# Patient Record
Sex: Female | Born: 1950
Health system: Southern US, Community
[De-identification: ages and names within clinical notes are randomized; demographics above are authoritative.]

## PROBLEM LIST (undated history)

## (undated) DIAGNOSIS — Z9889 Other specified postprocedural states: Secondary | ICD-10-CM

## (undated) DIAGNOSIS — R011 Cardiac murmur, unspecified: Secondary | ICD-10-CM

## (undated) DIAGNOSIS — E041 Nontoxic single thyroid nodule: Secondary | ICD-10-CM

## (undated) DIAGNOSIS — M1990 Unspecified osteoarthritis, unspecified site: Secondary | ICD-10-CM

## (undated) DIAGNOSIS — K08109 Complete loss of teeth, unspecified cause, unspecified class: Secondary | ICD-10-CM

## (undated) DIAGNOSIS — E785 Hyperlipidemia, unspecified: Secondary | ICD-10-CM

## (undated) DIAGNOSIS — R200 Anesthesia of skin: Secondary | ICD-10-CM

## (undated) DIAGNOSIS — H269 Unspecified cataract: Secondary | ICD-10-CM

## (undated) DIAGNOSIS — R112 Nausea with vomiting, unspecified: Secondary | ICD-10-CM

## (undated) DIAGNOSIS — G4730 Sleep apnea, unspecified: Secondary | ICD-10-CM

## (undated) DIAGNOSIS — I7121 Aneurysm of the ascending aorta, without rupture: Secondary | ICD-10-CM

## (undated) DIAGNOSIS — G510 Bell's palsy: Secondary | ICD-10-CM

## (undated) DIAGNOSIS — F32A Depression, unspecified: Secondary | ICD-10-CM

## (undated) DIAGNOSIS — J4520 Mild intermittent asthma, uncomplicated: Secondary | ICD-10-CM

## (undated) DIAGNOSIS — Z87440 Personal history of urinary (tract) infections: Secondary | ICD-10-CM

## (undated) DIAGNOSIS — K219 Gastro-esophageal reflux disease without esophagitis: Secondary | ICD-10-CM

## (undated) DIAGNOSIS — F419 Anxiety disorder, unspecified: Secondary | ICD-10-CM

## (undated) DIAGNOSIS — R030 Elevated blood-pressure reading, without diagnosis of hypertension: Secondary | ICD-10-CM

## (undated) DIAGNOSIS — Z87442 Personal history of urinary calculi: Secondary | ICD-10-CM

## (undated) DIAGNOSIS — G629 Polyneuropathy, unspecified: Secondary | ICD-10-CM

## (undated) DIAGNOSIS — Z972 Presence of dental prosthetic device (complete) (partial): Secondary | ICD-10-CM

## (undated) DIAGNOSIS — R0600 Dyspnea, unspecified: Secondary | ICD-10-CM

## (undated) DIAGNOSIS — I2510 Atherosclerotic heart disease of native coronary artery without angina pectoris: Secondary | ICD-10-CM

## (undated) DIAGNOSIS — M8580 Other specified disorders of bone density and structure, unspecified site: Secondary | ICD-10-CM

## (undated) DIAGNOSIS — T7840XA Allergy, unspecified, initial encounter: Secondary | ICD-10-CM

## (undated) DIAGNOSIS — R202 Paresthesia of skin: Secondary | ICD-10-CM

## (undated) DIAGNOSIS — I712 Thoracic aortic aneurysm, without rupture: Secondary | ICD-10-CM

## (undated) HISTORY — DX: Sleep apnea, unspecified: G47.30

## (undated) HISTORY — DX: Hyperlipidemia, unspecified: E78.5

## (undated) HISTORY — PX: EXTRACORPOREAL SHOCK WAVE LITHOTRIPSY: SHX1557

## (undated) HISTORY — DX: Allergy, unspecified, initial encounter: T78.40XA

## (undated) HISTORY — DX: Other specified disorders of bone density and structure, unspecified site: M85.80

## (undated) HISTORY — DX: Anxiety disorder, unspecified: F41.9

## (undated) HISTORY — DX: Gastro-esophageal reflux disease without esophagitis: K21.9

## (undated) HISTORY — DX: Unspecified cataract: H26.9

## (undated) HISTORY — PX: TUBAL LIGATION: SHX77

## (undated) HISTORY — PX: JOINT REPLACEMENT: SHX530

## (undated) HISTORY — DX: Depression, unspecified: F32.A

## (undated) HISTORY — PX: ANTERIOR CERVICAL DECOMP/DISCECTOMY FUSION: SHX1161

## (undated) HISTORY — PX: SPINE SURGERY: SHX786

## (undated) HISTORY — PX: MOUTH SURGERY: SHX715

## (undated) HISTORY — PX: FRACTURE SURGERY: SHX138

---
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
## 1998-02-15 HISTORY — PX: CARPAL TUNNEL RELEASE: SHX101

## 1998-05-20 ENCOUNTER — Ambulatory Visit (HOSPITAL_BASED_OUTPATIENT_CLINIC_OR_DEPARTMENT_OTHER): Admission: RE | Admit: 1998-05-20 | Discharge: 1998-05-20 | Payer: Self-pay | Admitting: Orthopedic Surgery

## 1998-06-17 ENCOUNTER — Ambulatory Visit (HOSPITAL_BASED_OUTPATIENT_CLINIC_OR_DEPARTMENT_OTHER): Admission: RE | Admit: 1998-06-17 | Discharge: 1998-06-17 | Payer: Self-pay | Admitting: Orthopedic Surgery

## 2006-08-01 ENCOUNTER — Emergency Department (HOSPITAL_COMMUNITY): Admission: EM | Admit: 2006-08-01 | Discharge: 2006-08-01 | Payer: Self-pay | Admitting: Family Medicine

## 2007-05-03 ENCOUNTER — Emergency Department (HOSPITAL_COMMUNITY): Admission: EM | Admit: 2007-05-03 | Discharge: 2007-05-03 | Payer: Self-pay | Admitting: Family Medicine

## 2007-09-29 ENCOUNTER — Inpatient Hospital Stay (HOSPITAL_COMMUNITY): Admission: RE | Admit: 2007-09-29 | Discharge: 2007-10-02 | Payer: Self-pay | Admitting: Specialist
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
## 2010-11-13 LAB — PROTIME-INR
INR: 1
Prothrombin Time: 13.6

## 2010-11-13 LAB — URINALYSIS, ROUTINE W REFLEX MICROSCOPIC
Bilirubin Urine: NEGATIVE
Bilirubin Urine: NEGATIVE
Glucose, UA: NEGATIVE
Glucose, UA: NEGATIVE
Hgb urine dipstick: NEGATIVE
Ketones, ur: NEGATIVE
Ketones, ur: NEGATIVE
Nitrite: NEGATIVE
Nitrite: NEGATIVE
Protein, ur: NEGATIVE
Protein, ur: NEGATIVE
Specific Gravity, Urine: 1.004 — ABNORMAL LOW
Specific Gravity, Urine: 1.015
Urobilinogen, UA: 0.2
Urobilinogen, UA: 0.2
pH: 6
pH: 7

## 2010-11-13 LAB — COMPREHENSIVE METABOLIC PANEL
ALT: 20
AST: 19
Albumin: 3.5
Alkaline Phosphatase: 85
BUN: 8
CO2: 29
Calcium: 9.1
Chloride: 107
Creatinine, Ser: 0.68
GFR calc Af Amer: >60
GFR calc non Af Amer: >60
Glucose, Bld: 116 — ABNORMAL HIGH
Potassium: 3.4 — ABNORMAL LOW
Sodium: 144
Total Bilirubin: 0.6
Total Protein: 6.2

## 2010-11-13 LAB — DIFFERENTIAL
Basophils Absolute: 0
Basophils Relative: 1
Eosinophils Absolute: 0.5
Eosinophils Relative: 7 — ABNORMAL HIGH
Lymphocytes Relative: 24
Lymphs Abs: 2
Monocytes Absolute: 0.5
Monocytes Relative: 6
Neutro Abs: 5.2
Neutrophils Relative %: 63

## 2010-11-13 LAB — TYPE AND SCREEN
ABO/RH(D): O POS
Antibody Screen: NEGATIVE

## 2010-11-13 LAB — ABO/RH: ABO/RH(D): O POS

## 2010-11-13 LAB — URINE MICROSCOPIC-ADD ON

## 2010-11-13 LAB — CBC
HCT: 38.7
Hemoglobin: 13.1
MCHC: 33.7
MCV: 97.1
Platelets: 297
RBC: 3.99
RDW: 13.3
WBC: 8.3

## 2010-11-13 LAB — APTT: aPTT: 29

## 2010-12-02 LAB — POCT RAPID STREP A: Streptococcus, Group A Screen (Direct): NEGATIVE

## 2012-07-27 ENCOUNTER — Other Ambulatory Visit (HOSPITAL_COMMUNITY): Payer: Self-pay | Admitting: Family Medicine

## 2012-07-27 ENCOUNTER — Ambulatory Visit (HOSPITAL_COMMUNITY)
Admission: RE | Admit: 2012-07-27 | Discharge: 2012-07-27 | Disposition: A | Discharge status: Home / Self Care | Payer: Disability Insurance | Source: Ambulatory Visit | Attending: Family Medicine | Admitting: Family Medicine

## 2012-07-27 DIAGNOSIS — R52 Pain, unspecified: Secondary | ICD-10-CM

## 2012-07-27 DIAGNOSIS — M19049 Primary osteoarthritis, unspecified hand: Secondary | ICD-10-CM | POA: Insufficient documentation

## 2012-07-27 DIAGNOSIS — M1710 Unilateral primary osteoarthritis, unspecified knee: Secondary | ICD-10-CM | POA: Insufficient documentation

## 2015-10-10 ENCOUNTER — Encounter: Payer: Self-pay | Admitting: Pediatrics

## 2015-10-10 ENCOUNTER — Ambulatory Visit (INDEPENDENT_AMBULATORY_CARE_PROVIDER_SITE_OTHER): Payer: Medicare Other | Admitting: Pediatrics

## 2015-10-10 VITALS — BP 135/71 | HR 64 | Temp 97.3°F | Ht 58.25 in | Wt 161.0 lb

## 2015-10-10 DIAGNOSIS — Z1159 Encounter for screening for other viral diseases: Secondary | ICD-10-CM | POA: Diagnosis not present

## 2015-10-10 DIAGNOSIS — R7303 Prediabetes: Secondary | ICD-10-CM

## 2015-10-10 DIAGNOSIS — I10 Essential (primary) hypertension: Secondary | ICD-10-CM

## 2015-10-10 DIAGNOSIS — M5489 Other dorsalgia: Secondary | ICD-10-CM

## 2015-10-10 DIAGNOSIS — G629 Polyneuropathy, unspecified: Secondary | ICD-10-CM

## 2015-10-10 DIAGNOSIS — F4323 Adjustment disorder with mixed anxiety and depressed mood: Secondary | ICD-10-CM

## 2015-10-10 DIAGNOSIS — K219 Gastro-esophageal reflux disease without esophagitis: Secondary | ICD-10-CM | POA: Diagnosis not present

## 2015-10-10 DIAGNOSIS — E785 Hyperlipidemia, unspecified: Secondary | ICD-10-CM | POA: Diagnosis not present

## 2015-10-10 LAB — BAYER DCA HB A1C WAIVED: HB A1C (BAYER DCA - WAIVED): 5.9 % (ref <7.0)

## 2015-10-10 MED ORDER — CITALOPRAM HYDROBROMIDE 20 MG PO TABS: 20.0000 mg | ORAL_TABLET | Freq: Every day | ORAL | 3 refills | Status: DC

## 2015-10-10 MED ORDER — GABAPENTIN 300 MG PO CAPS: 300.0000 mg | ORAL_CAPSULE | Freq: Two times a day (BID) | ORAL | 2 refills | Status: DC

## 2015-10-10 MED ORDER — RANITIDINE HCL 150 MG PO TABS
150.0000 mg | ORAL_TABLET | Freq: Two times a day (BID) | ORAL | 2 refills | Status: DC
Start: 2015-10-10 — End: 2015-12-23

## 2015-10-10 MED ORDER — CYCLOBENZAPRINE HCL 10 MG PO TABS: 10.0000 mg | ORAL_TABLET | Freq: Three times a day (TID) | ORAL | 2 refills | Status: DC | PRN

## 2015-10-10 MED ORDER — LISINOPRIL-HYDROCHLOROTHIAZIDE 10-12.5 MG PO TABS: 1.0000 | ORAL_TABLET | Freq: Every day | ORAL | 2 refills | Status: DC

## 2015-10-10 NOTE — Progress Notes (Addendum)
Subjective:   [name], female    DOB: 1950/08/14, 65 y.o.   MRN: [id number] CC: Establish Care (htn, and other chronic health problems)  HPI: Joyce Ross is a 65 y.o. female presenting for Establish Care (htn, and other chronic health problems)  Arthritis in her hands, hips, back Recently approved for medicare Takes flexeril for back spasms Was following at health dept in [place]  Dx with neuropathy Has trouble breathing at times Worse when she gets URI Stopped smoking 3 years ago  Bells Palsy almost 71 yrs ago Still with some symptoms  Lots of stress from babies   Past Medical History:  Diagnosis Date  . Bell's palsy   . Bladder infection    FREQUENT  . GERD (gastroesophageal reflux disease)   . Hyperlipidemia   . Hypertension   . Neuropathy (HCC)    FEET AND LEGS (NON DIABETIC)   Family History  Problem Relation Age of Onset  . Heart disease Mother   . Heart disease Father     heart attack  . Heart disease Sister   . Diabetes Brother   . Diabetes Brother     sepsis  . Heart disease Brother    Social History   Social History  . Marital status: Married    Spouse name: N/A  . Number of children: N/A  . Years of education: N/A   Social History Main Topics  . Smoking status: Former Smoker    Types: Cigarettes    Quit date: 10/09/2012  . Smokeless tobacco: Never Used  . Alcohol use No  . Drug use: No  . Sexual activity: Not Asked   Other Topics Concern  . None   Social History Narrative  . None   ROS: All systems negative other than what is in HPI  Objective:    BP 135/71 (BP Location: Left Wrist)   Pulse 64   Temp 97.3 F (36.3 C) (Oral)   Ht 4' 10.25" (1.48 m)   Wt 161 lb (73 kg)   BMI 33.36 kg/m   Wt Readings from Last 3 Encounters:  10/10/15 161 lb (73 kg)    Gen: NAD, alert, cooperative with exam, NCAT EYES: EOMI, no conjunctival injection, or no icterus ENT:  TMs pearly gray b/l, OP without erythema LYMPH:  no cervical LAD CV: NRRR, normal V0/J5, I/VI systolic ejection murmur, distal pulses 2+ b/l Resp: CTABL, no wheezes, normal WOB Abd: +BS, soft, NTND. no guarding or organomegaly Ext: No edema, warm Neuro: Alert and oriented, strength equal b/l UE and LE, coordination grossly normal MSK: deviated distal phalanges all fingers, ttp midline lower back  Assessment & Plan:  Mariangela was seen today for establish care.  Diagnoses and all orders for this visit:  Neuropathy [facility]) Says she ahs had work up at health dept Will get records before repeating all lab work up Cont gabapentin -     gabapentin (NEURONTIN) 300 MG capsule; Take 1 capsule (300 mg total) by mouth 2 (two) times daily. -     Bayer DCA Hb A1c Waived -     CMP14+EGFR  Essential hypertension Adequate control today Cont meds BMP today -     lisinopril-hydrochlorothiazide (PRINZIDE,ZESTORETIC) 10-12.5 MG tablet; Take 1 tablet by mouth daily.  Adjustment disorder with mixed anxiety and depressed mood Feels like nerves on edge Wants to start something for mood Start below, half tab x8 days, then full tab -     citalopram (CELEXA) 20 MG tablet; Take 1  tablet (20 mg total) by mouth daily.  Gastroesophageal reflux disease, esophagitis presence not specified Cont below med -     ranitidine (ZANTAC) 150 MG tablet; Take 1 tablet (150 mg total) by mouth 2 (two) times daily.  Midline back pain, unspecified location -     cyclobenzaprine (FLEXERIL) 10 MG tablet; Take 1 tablet (10 mg total) by mouth 3 (three) times daily as needed for muscle spasms.  Need for hepatitis C screening test -     Hepatitis C antibody   Follow up plan: Return in about 4 weeks (around 11/07/2015). Assunta Found, MD [place]

## 2015-10-11 LAB — CMP14+EGFR
ALT: 11 IU/L (ref 0–32)
AST: 12 IU/L (ref 0–40)
Albumin/Globulin Ratio: 1.3 (ref 1.2–2.2)
Albumin: 4 g/dL (ref 3.6–4.8)
Alkaline Phosphatase: 92 IU/L (ref 39–117)
BUN/Creatinine Ratio: 24 (ref 12–28)
BUN: 23 mg/dL (ref 8–27)
Bilirubin Total: 0.2 mg/dL (ref 0.0–1.2)
CO2: 27 mmol/L (ref 18–29)
Calcium: 9.1 mg/dL (ref 8.7–10.3)
Chloride: 102 mmol/L (ref 96–106)
Creatinine, Ser: 0.94 mg/dL (ref 0.57–1.00)
GFR calc Af Amer: 74 mL/min/{1.73_m2} (ref >59)
GFR calc non Af Amer: 64 mL/min/{1.73_m2} (ref >59)
Globulin, Total: 3.1 g/dL (ref 1.5–4.5)
Glucose: 82 mg/dL (ref 65–99)
Potassium: 4 mmol/L (ref 3.5–5.2)
Sodium: 143 mmol/L (ref 134–144)
Total Protein: 7.1 g/dL (ref 6.0–8.5)

## 2015-10-11 LAB — HEPATITIS C ANTIBODY: Hep C Virus Ab: <0.1 s/co ratio (ref 0.0–0.9)

## 2015-10-15 DIAGNOSIS — E785 Hyperlipidemia, unspecified: Secondary | ICD-10-CM | POA: Insufficient documentation

## 2015-10-15 DIAGNOSIS — K219 Gastro-esophageal reflux disease without esophagitis: Secondary | ICD-10-CM | POA: Insufficient documentation

## 2015-10-15 DIAGNOSIS — F4323 Adjustment disorder with mixed anxiety and depressed mood: Secondary | ICD-10-CM | POA: Insufficient documentation

## 2015-10-15 DIAGNOSIS — R7303 Prediabetes: Secondary | ICD-10-CM | POA: Insufficient documentation

## 2015-10-15 DIAGNOSIS — I10 Essential (primary) hypertension: Secondary | ICD-10-CM | POA: Insufficient documentation

## 2015-10-15 DIAGNOSIS — M549 Dorsalgia, unspecified: Secondary | ICD-10-CM | POA: Insufficient documentation

## 2015-10-15 DIAGNOSIS — G629 Polyneuropathy, unspecified: Secondary | ICD-10-CM | POA: Insufficient documentation

## 2015-10-15 DIAGNOSIS — F331 Major depressive disorder, recurrent, moderate: Secondary | ICD-10-CM | POA: Insufficient documentation

## 2015-12-04 ENCOUNTER — Encounter: Payer: Self-pay | Admitting: Pediatrics

## 2015-12-04 ENCOUNTER — Ambulatory Visit (INDEPENDENT_AMBULATORY_CARE_PROVIDER_SITE_OTHER): Payer: Medicare Other | Admitting: Pediatrics

## 2015-12-04 VITALS — BP 126/66 | HR 65 | Temp 98.7°F | Ht 58.25 in | Wt 164.2 lb

## 2015-12-04 DIAGNOSIS — R590 Localized enlarged lymph nodes: Secondary | ICD-10-CM | POA: Diagnosis not present

## 2015-12-04 DIAGNOSIS — M1711 Unilateral primary osteoarthritis, right knee: Secondary | ICD-10-CM

## 2015-12-04 DIAGNOSIS — F4323 Adjustment disorder with mixed anxiety and depressed mood: Secondary | ICD-10-CM | POA: Diagnosis not present

## 2015-12-04 DIAGNOSIS — R059 Cough, unspecified: Secondary | ICD-10-CM

## 2015-12-04 DIAGNOSIS — R0989 Other specified symptoms and signs involving the circulatory and respiratory systems: Secondary | ICD-10-CM | POA: Diagnosis not present

## 2015-12-04 DIAGNOSIS — M7061 Trochanteric bursitis, right hip: Secondary | ICD-10-CM | POA: Diagnosis not present

## 2015-12-04 DIAGNOSIS — I10 Essential (primary) hypertension: Secondary | ICD-10-CM

## 2015-12-04 DIAGNOSIS — R05 Cough: Secondary | ICD-10-CM | POA: Diagnosis not present

## 2015-12-04 DIAGNOSIS — G629 Polyneuropathy, unspecified: Secondary | ICD-10-CM | POA: Diagnosis not present

## 2015-12-04 MED ORDER — GABAPENTIN 300 MG PO CAPS: 300.0000 mg | ORAL_CAPSULE | Freq: Four times a day (QID) | ORAL | 2 refills | Status: DC

## 2015-12-04 NOTE — Progress Notes (Signed)
  Subjective:   [name], female    DOB: [birth year], 65 y.o.   MRN: [id number] CC: Follow-up  HPI: Joyce Ross is a 65 y.o. female presenting for Follow-up  Neuropathy: Does help when she takes gabapentin 600mg  at night Has numbness in her toes throughout the day, not bothering her much  Has R knee pain, when she favors the R knee her R hip starts hurting Resting helps the pain Not taking NSAIDs regularly  Cough and congestion: Off and on for months Feels better for a few weeks This time cough started about two weeks ago Sometimes coughing up green sputum Coughing more than usual No fever No SOB Normal appetite Weight stable  Relevant past medical, surgical, family and social history reviewed. Allergies and medications reviewed and updated. History  Smoking Status  . Former Smoker  . Types: Cigarettes  . Quit date: 10/09/2012  Smokeless Tobacco  . Never Used   ROS: Per HPI   Objective:    BP 126/66   Pulse 65   Temp 98.7 F (37.1 C) (Oral)   Ht 4' 10.25" (1.48 m)   Wt 164 lb 3.2 oz (74.5 kg)   BMI 34.02 kg/m   Wt Readings from Last 3 Encounters:  12/04/15 164 lb 3.2 oz (74.5 kg)  10/10/15 161 lb (73 kg)    Gen: NAD, alert, cooperative with exam, NCAT EYES: EOMI, no conjunctival injection, or no icterus ENT:  TMs pearly gray b/l, OP without erythema LYMPH: b/l supraclavicular LAD, apprx 1 cm a couple of nodes each side, mobile, non-tender CV: NRRR, normal S1/S2, no murmur, distal pulses 2+ b/l Resp: CTABL, no wheezes, normal WOB Abd: +BS, soft, NTND. no guarding or organomegaly Ext: No edema, warm Neuro: Alert and oriented MSK: normal muscle bulk  Assessment & Plan:  Joyce Ross was seen today for follow-up multiple med problems.  Diagnoses and all orders for this visit:  Neuropathy ([place]) Gabapentin helped with symptoms Pt says still noticing in the day Wants to try day time gabapentin Doesn't want nerve conduction study at this  time Says symptoms have been fairly stable, no worsening -     gabapentin (NEURONTIN) 300 MG capsule; Take 1 capsule (300 mg total) by mouth 4 (four) times daily.  Essential hypertension Stopped BP meds a week ago, BP was in 0000000 systolic  Feels better Stop medications  Adjustment disorder with mixed anxiety and depressed mood Stable, cont citalopram  Greater trochanteric bursitis of right hip NSAIDs, rest, gentle stretching. Exercises given.  Osteoarthritis of right knee, unspecified osteoarthritis type NSAIDs, rest  Supraclavicular adenopathy 40+ year smoking history Supraclavicular nodes that have bene there for several months -     CT CHEST WO CONTRAST; Future  Chest congestion -     PR BREATHING CAPACITY TEST  Coughing -     PR BREATHING CAPACITY TEST   Follow up plan: 3 mo Assunta Found, MD [facility] [place]

## 2015-12-04 NOTE — Patient Instructions (Addendum)
Trochanteric Bursitis You have hip pain due to trochanteric bursitis. Bursitis means that the sack near the outside of the hip is filled with fluid and inflamed. This sack is made up of protective soft tissue. The pain from trochanteric bursitis can be severe and keep you from sleep. It can radiate to the buttocks or down the outside of the thigh to the knee. The pain is almost always worse when rising from the seated or lying position and with walking. Pain can improve after you take a few steps. It happens more often in people with hip joint and lumbar spine problems, such as arthritis or previous surgery. Very rarely the trochanteric bursa can become infected, and antibiotics and/or surgery may be needed. Treatment often includes an injection of local anesthetic mixed with cortisone medicine. This medicine is injected into the area where it is most tender over the hip. Repeat injections may be necessary if the response to treatment is slow. You can apply ice packs over the tender area for 30 minutes every 2 hours for the next few days. Anti-inflammatory and/or narcotic pain medicine may also be helpful. Limit your activity for the next few days if the pain continues. See your caregiver in 5-10 days if you are not greatly improved.  SEEK IMMEDIATE MEDICAL CARE IF:  You develop severe pain, fever, or increased redness.  You have pain that radiates below the knee. EXERCISES STRETCHING EXERCISES - Trochanteric Bursitis  These exercises may help you when beginning to rehabilitate your injury. Your symptoms may resolve with or without further involvement from your physician, physical therapist, or athletic trainer. While completing these exercises, remember:   Restoring tissue flexibility helps normal motion to return to the joints. This allows healthier, less painful movement and activity.  An effective stretch should be held for at least 30 seconds.  A stretch should never be painful. You should only  feel a gentle lengthening or release in the stretched tissue. STRETCH - Iliotibial Band  On the floor or bed, lie on your side so your injured leg is on top. Bend your knee and grab your ankle.  Slowly bring your knee back so that your thigh is in line with your trunk. Keep your heel at your buttocks and gently arch your back so your head, shoulders and hips line up.  Slowly lower your leg so that your knee approaches the floor/bed until you feel a gentle stretch on the outside of your thigh. If you do not feel a stretch and your knee will not fall farther, place the heel of your opposite foot on top of your knee and pull your thigh down farther.  Hold this stretch for __________ seconds.  Repeat __________ times. Complete this exercise __________ times per day. STRETCH - Hamstrings, Supine   Lie on your back. Loop a belt or towel over the ball of your foot as shown.  Straighten your knee and slowly pull on the belt to raise your injured leg. Do not allow the knee to bend. Keep your opposite leg flat on the floor.  Raise the leg until you feel a gentle stretch behind your knee or thigh. Hold this position for __________ seconds.  Repeat __________ times. Complete this stretch __________ times per day. STRETCH - Quadriceps, Prone   Lie on your stomach on a firm surface, such as a bed or padded floor.  Bend your knee and grasp your ankle. If you are unable to reach your ankle or pant leg, use a belt   around your foot to lengthen your reach.  Gently pull your heel toward your buttocks. Your knee should not slide out to the side. You should feel a stretch in the front of your thigh and/or knee.  Hold this position for __________ seconds.  Repeat __________ times. Complete this stretch __________ times per day. STRETCHING - Hip Flexors, Lunge Half kneel with your knee on the floor and your opposite knee bent and directly over your ankle.  Keep good posture with your head over your  shoulders. Tighten your buttocks to point your tailbone downward; this will prevent your back from arching too much.  You should feel a gentle stretch in the front of your thigh and/or hip. If you do not feel any resistance, slightly slide your opposite foot forward and then slowly lunge forward so your knee once again lines up over your ankle. Be sure your tailbone remains pointed downward.  Hold this stretch for __________ seconds.  Repeat __________ times. Complete this stretch __________ times per day. STRETCH - Adductors, Lunge  While standing, spread your legs.  Lean away from your injured leg by bending your opposite knee. You may rest your hands on your thigh for balance.  You should feel a stretch in your inner thigh. Hold for __________ seconds.  Repeat __________ times. Complete this exercise __________ times per day.   This information is not intended to replace advice given to you by your health care provider. Make sure you discuss any questions you have with your health care provider.   Document Released: 03/11/2004 Document Revised: 06/18/2014 Document Reviewed: 05/16/2008 Elsevier Interactive Patient Education 2016 Elsevier Inc.  

## 2015-12-10 ENCOUNTER — Ambulatory Visit (HOSPITAL_COMMUNITY): Payer: Medicare Other

## 2015-12-22 ENCOUNTER — Other Ambulatory Visit: Payer: Self-pay | Admitting: *Deleted

## 2015-12-22 DIAGNOSIS — F4323 Adjustment disorder with mixed anxiety and depressed mood: Secondary | ICD-10-CM

## 2015-12-22 MED ORDER — CITALOPRAM HYDROBROMIDE 20 MG PO TABS: 20.0000 mg | ORAL_TABLET | Freq: Every day | ORAL | 1 refills | Status: DC

## 2015-12-23 ENCOUNTER — Other Ambulatory Visit: Payer: Self-pay | Admitting: *Deleted

## 2015-12-23 DIAGNOSIS — K219 Gastro-esophageal reflux disease without esophagitis: Secondary | ICD-10-CM

## 2015-12-23 DIAGNOSIS — G629 Polyneuropathy, unspecified: Secondary | ICD-10-CM

## 2015-12-23 MED ORDER — RANITIDINE HCL 150 MG PO TABS: 150.0000 mg | ORAL_TABLET | Freq: Two times a day (BID) | ORAL | 1 refills | Status: DC

## 2015-12-23 MED ORDER — CYCLOBENZAPRINE HCL 10 MG PO TABS: 10.0000 mg | ORAL_TABLET | Freq: Three times a day (TID) | ORAL | 2 refills | Status: DC | PRN

## 2015-12-23 MED ORDER — GABAPENTIN 300 MG PO CAPS: 300.0000 mg | ORAL_CAPSULE | Freq: Four times a day (QID) | ORAL | 1 refills | Status: DC

## 2015-12-24 ENCOUNTER — Other Ambulatory Visit: Payer: Self-pay | Admitting: *Deleted

## 2016-02-04 ENCOUNTER — Ambulatory Visit: Payer: Medicare Other | Admitting: Pediatrics

## 2016-02-18 ENCOUNTER — Other Ambulatory Visit: Payer: Self-pay | Admitting: Pediatrics

## 2016-05-17 ENCOUNTER — Other Ambulatory Visit: Payer: Self-pay | Admitting: Pediatrics

## 2016-05-17 ENCOUNTER — Telehealth: Payer: Self-pay | Admitting: Pediatrics

## 2016-05-17 NOTE — Telephone Encounter (Signed)
What is the name of the medication? Gabapentin 300 mg, Cyclobenzaprine 10 mg, Citalopram 20 mg and Ranitidine 150 mg  Have you contacted your pharmacy to request a refill? No  Which pharmacy would you like this sent to? Patient wants to pickup   Patient notified that their request is being sent to the clinical staff for review and that they should receive a call once it is complete. If they do not receive a call within 24 hours they can check with their pharmacy or our office.

## 2016-05-17 NOTE — Telephone Encounter (Signed)
Patient don't need all medications

## 2016-05-17 NOTE — Telephone Encounter (Signed)
What is the name of the medication? Cyclobenzaprine 10 mg  Have you contacted your pharmacy to request a refill? NO  Which pharmacy would you like this sent to? Patient is going to pickup   Patient notified that their request is being sent to the clinical staff for review and that they should receive a call once it is complete. If they do not receive a call within 24 hours they can check with their pharmacy or our office.

## 2016-05-19 MED ORDER — CYCLOBENZAPRINE HCL 10 MG PO TABS: ORAL_TABLET | ORAL | 1 refills | Status: DC

## 2016-05-19 NOTE — Telephone Encounter (Signed)
Medication sent to pharmacy  

## 2016-05-20 ENCOUNTER — Ambulatory Visit: Payer: Medicare Other | Admitting: Pediatrics

## 2016-06-07 DIAGNOSIS — Z029 Encounter for administrative examinations, unspecified: Secondary | ICD-10-CM

## 2016-06-15 ENCOUNTER — Telehealth: Payer: Self-pay | Admitting: Pediatrics

## 2016-06-15 DIAGNOSIS — G629 Polyneuropathy, unspecified: Secondary | ICD-10-CM

## 2016-06-15 DIAGNOSIS — F4323 Adjustment disorder with mixed anxiety and depressed mood: Secondary | ICD-10-CM

## 2016-06-16 MED ORDER — CYCLOBENZAPRINE HCL 10 MG PO TABS: ORAL_TABLET | ORAL | 0 refills | Status: DC

## 2016-06-16 MED ORDER — GABAPENTIN 300 MG PO CAPS: 300.0000 mg | ORAL_CAPSULE | Freq: Four times a day (QID) | ORAL | 0 refills | Status: DC

## 2016-06-16 MED ORDER — CITALOPRAM HYDROBROMIDE 20 MG PO TABS: 20.0000 mg | ORAL_TABLET | Freq: Every day | ORAL | 0 refills | Status: DC

## 2016-06-16 NOTE — Telephone Encounter (Signed)
Patient requested the prescriptions to be printed so she could take them to the most cost effective pharmacies.  Advised patient prescriptions had been printed and are up front for her to pick up.

## 2016-06-16 NOTE — Telephone Encounter (Signed)
Where does she want Rx sent in? I will send in 30 days, see her next week as scheduled

## 2016-06-21 ENCOUNTER — Encounter (INDEPENDENT_AMBULATORY_CARE_PROVIDER_SITE_OTHER): Payer: Self-pay

## 2016-06-21 ENCOUNTER — Ambulatory Visit (INDEPENDENT_AMBULATORY_CARE_PROVIDER_SITE_OTHER): Payer: Medicare Other | Admitting: Pediatrics

## 2016-06-21 ENCOUNTER — Encounter: Payer: Self-pay | Admitting: Pediatrics

## 2016-06-21 VITALS — BP 138/66 | HR 63 | Temp 98.8°F | Ht 58.25 in | Wt 171.8 lb

## 2016-06-21 DIAGNOSIS — F4323 Adjustment disorder with mixed anxiety and depressed mood: Secondary | ICD-10-CM

## 2016-06-21 DIAGNOSIS — M1711 Unilateral primary osteoarthritis, right knee: Secondary | ICD-10-CM | POA: Diagnosis not present

## 2016-06-21 DIAGNOSIS — J309 Allergic rhinitis, unspecified: Secondary | ICD-10-CM | POA: Diagnosis not present

## 2016-06-21 DIAGNOSIS — M1611 Unilateral primary osteoarthritis, right hip: Secondary | ICD-10-CM | POA: Diagnosis not present

## 2016-06-21 DIAGNOSIS — M25551 Pain in right hip: Secondary | ICD-10-CM

## 2016-06-21 MED ORDER — CITALOPRAM HYDROBROMIDE 40 MG PO TABS: 40.0000 mg | ORAL_TABLET | Freq: Every day | ORAL | 3 refills | Status: DC

## 2016-06-21 MED ORDER — FLUTICASONE PROPIONATE 50 MCG/ACT NA SUSP: 2.0000 | Freq: Every day | NASAL | 6 refills | Status: DC

## 2016-06-21 MED ORDER — CETIRIZINE HCL 10 MG PO TABS: 10.0000 mg | ORAL_TABLET | Freq: Every day | ORAL | 11 refills | Status: DC

## 2016-06-21 NOTE — Progress Notes (Signed)
  Subjective:   [name], female    DOB: 05-13-1950, 66 y.o.   MRN: [id number] CC: Hip Pain and Back Pain  HPI: Joyce Ross is a 66 y.o. female presenting for Hip Pain and Back Pain  Allergies have been bothering her past couple of weeks Taking night time OTC allergy medicine  Needs a walker, 4 wheels with seat If she tries to do a lot of walking R knee, R hip start bothering her, needs to sit down quickly when knee or hip gives out with walking  Losing patience easier Thinks citalopram is helping but not enough Lots of stress recently with losing benefits, having to pay money back Feels safe at home, no thoughts of self harm  Relevant past medical, surgical, family and social history reviewed. Allergies and medications reviewed and updated. History  Smoking Status  . Former Smoker  . Types: Cigarettes  . Quit date: 10/09/2012  Smokeless Tobacco  . Never Used   ROS: Per HPI   Objective:    BP 138/66   Pulse 63   Temp 98.8 F (37.1 C) (Oral)   Ht 4' 10.25" (1.48 m)   Wt 171 lb 12.8 oz (77.9 kg)   BMI 35.60 kg/m   Wt Readings from Last 3 Encounters:  06/21/16 171 lb 12.8 oz (77.9 kg)  12/04/15 164 lb 3.2 oz (74.5 kg)  10/10/15 161 lb (73 kg)    Gen: NAD, alert, cooperative with exam, NCAT EYES: EOMI, no conjunctival injection, or no icterus ENT:  TMs dull gray b/l, OP without erythema LYMPH: no cervical LAD CV: NRRR, normal S1/S2 Resp: CTABL, no wheezes, normal WOB Abd: +BS, soft, NTND. no guarding or organomegaly Ext: No edema, warm Neuro: Alert and oriented MSK: pain with int rotation R hip   Assessment & Plan:  Joyce Ross was seen today for hip pain and back pain.  Diagnoses and all orders for this visit:  Allergic rhinitis, unspecified seasonality, unspecified trigger Seasonal allergies, ongoing symptoms, start below -     cetirizine (ZYRTEC) 10 MG tablet; Take 1 tablet (10 mg total) by mouth daily. -     fluticasone (FLONASE) 50  MCG/ACT nasal spray; Place 2 sprays into both nostrils daily.  Adjustment disorder with mixed anxiety and depressed mood Continuing mood problems, increase below Feels safe at home Discussed v[facility] program, pt agrees to referral -     citalopram (CELEXA) 40 MG tablet; Take 1 tablet (40 mg total) by mouth daily.  Osteoarthritis of right hip, unspecified osteoarthritis type Needs walker with seat for when her knee or hip gives out -     Walker rolling  Osteoarthritis of right knee, unspecified osteoarthritis type -     Walker rolling  Right hip pain Pain comes and goes, worse with weight bearing Get xray -     DG HIP UNILAT W OR W/O PELVIS 2-3 VIEWS RIGHT; Future   Follow up plan: Return in about 3 months (around 09/21/2016). Assunta Found, MD [place]

## 2016-06-22 ENCOUNTER — Telehealth: Payer: Self-pay | Admitting: Pediatrics

## 2016-06-22 NOTE — Telephone Encounter (Signed)
lmtcb

## 2016-07-08 NOTE — Telephone Encounter (Signed)
Per below

## 2016-07-08 NOTE — Telephone Encounter (Signed)
If hip is bothering her still she can come get xray done. If doing ok can wait until next appt to discuss.

## 2016-07-08 NOTE — Telephone Encounter (Signed)
Patient never got hip xray on 5/7- does she need to come in and do it?

## 2016-07-14 NOTE — Telephone Encounter (Signed)
Left message , can have a hip x-ray if she has not had any improvement.

## 2016-07-15 ENCOUNTER — Encounter: Payer: Self-pay | Admitting: Pediatrics

## 2016-07-15 ENCOUNTER — Ambulatory Visit (INDEPENDENT_AMBULATORY_CARE_PROVIDER_SITE_OTHER): Payer: Medicare Other

## 2016-07-15 ENCOUNTER — Ambulatory Visit (INDEPENDENT_AMBULATORY_CARE_PROVIDER_SITE_OTHER): Payer: Medicare Other | Admitting: Pediatrics

## 2016-07-15 VITALS — BP 139/76 | HR 67 | Temp 98.6°F | Ht 58.25 in | Wt 172.0 lb

## 2016-07-15 DIAGNOSIS — M25551 Pain in right hip: Secondary | ICD-10-CM | POA: Diagnosis not present

## 2016-07-15 DIAGNOSIS — M7061 Trochanteric bursitis, right hip: Secondary | ICD-10-CM | POA: Diagnosis not present

## 2016-07-15 NOTE — Progress Notes (Signed)
  Subjective:   [name], female    DOB: 1951/02/05, 66 y.o.   MRN: [id number] CC: Hip Pain (right, intermittent)  HPI: Joyce Ross is a 66 y.o. female presenting for Hip Pain (right, intermittent)  R hip bothers her when she walks Moving even 20 ft often bothers hip Daily pain Worse with activity No pain at rest Sitting down helps Standing, not walking, helps some  Has been taking 2 ibuprofen when she cant sleep, otherwise not taking meds  Relevant past medical, surgical, family and social history reviewed. Allergies and medications reviewed and updated. History  Smoking Status  . Former Smoker  . Types: Cigarettes  . Quit date: 10/09/2012  Smokeless Tobacco  . Never Used   ROS: Per HPI   Objective:    BP 139/76   Pulse 67   Temp 98.6 F (37 C) (Oral)   Ht 4' 10.25" (1.48 m)   Wt 172 lb (78 kg)   BMI 35.64 kg/m   Wt Readings from Last 3 Encounters:  07/15/16 172 lb (78 kg)  06/21/16 171 lb 12.8 oz (77.9 kg)  12/04/15 164 lb 3.2 oz (74.5 kg)     Gen: NAD, alert, cooperative with exam, NCAT EYES: EOMI, no conjunctival injection, or no icterus CV: NRRR, normal S1/S2, no murmur, distal pulses 2+ b/l Resp: CTABL, no wheezes, normal WOB Abd: +BS, soft, NTND. Ext: No edema, warm Neuro: Alert and oriented, strength equal b/l UE and LE, coordination grossly normal MSK: ttp over R side greater trochanter, pain with internal rotation R hip lateral hip, no groin pain with ROM of R hip  Assessment & Plan:  Joyce Ross was seen today for hip pain.  Diagnoses and all orders for this visit:  Right hip pain No arthritis in hip -     DG HIP UNILAT W OR W/O PELVIS 2-3 VIEWS RIGHT; Future  Trochanteric bursitis of right hip Discussed options, declines steroid injection today, gave exercises/home stretches, NSAIDs, rest, rtc if not improving  Follow up plan: Return in about 3 months (around 10/15/2016). Assunta Found, MD [place]

## 2016-07-15 NOTE — Patient Instructions (Addendum)

## 2016-08-07 ENCOUNTER — Other Ambulatory Visit: Payer: Self-pay | Admitting: Pediatrics

## 2016-08-10 ENCOUNTER — Other Ambulatory Visit: Payer: Self-pay | Admitting: Pediatrics

## 2016-08-10 MED ORDER — CYCLOBENZAPRINE HCL 10 MG PO TABS: ORAL_TABLET | ORAL | 1 refills | Status: DC

## 2016-08-10 NOTE — Telephone Encounter (Signed)
Rx for Flexeril sent to wrong pharmacy on 08/09/16.  Resent Rx to correct pharmacy.

## 2016-08-19 ENCOUNTER — Other Ambulatory Visit: Payer: Self-pay | Admitting: Pediatrics

## 2016-08-19 DIAGNOSIS — G629 Polyneuropathy, unspecified: Secondary | ICD-10-CM

## 2016-09-09 ENCOUNTER — Encounter: Payer: Self-pay | Admitting: Nurse Practitioner

## 2016-09-09 ENCOUNTER — Ambulatory Visit (INDEPENDENT_AMBULATORY_CARE_PROVIDER_SITE_OTHER): Payer: Medicare Other | Admitting: Nurse Practitioner

## 2016-09-09 ENCOUNTER — Telehealth: Payer: Self-pay | Admitting: Nurse Practitioner

## 2016-09-09 VITALS — BP 138/64 | HR 67 | Temp 98.3°F | Ht <= 58 in | Wt 171.0 lb

## 2016-09-09 DIAGNOSIS — R0602 Shortness of breath: Secondary | ICD-10-CM

## 2016-09-09 DIAGNOSIS — J4521 Mild intermittent asthma with (acute) exacerbation: Secondary | ICD-10-CM | POA: Diagnosis not present

## 2016-09-09 MED ORDER — PREDNISONE 20 MG PO TABS: ORAL_TABLET | ORAL | 0 refills | Status: DC

## 2016-09-09 MED ORDER — LEVALBUTEROL HCL 1.25 MG/3ML IN NEBU
1.2500 mg | INHALATION_SOLUTION | Freq: Once | RESPIRATORY_TRACT | Status: AC
  Administered 2016-09-09: 1.25 mg via RESPIRATORY_TRACT

## 2016-09-09 MED ORDER — BUDESONIDE-FORMOTEROL FUMARATE 80-4.5 MCG/ACT IN AERO: 2.0000 | INHALATION_SPRAY | Freq: Two times a day (BID) | RESPIRATORY_TRACT | 12 refills | Status: DC

## 2016-09-09 MED ORDER — ALBUTEROL SULFATE HFA 108 (90 BASE) MCG/ACT IN AERS: 2.0000 | INHALATION_SPRAY | Freq: Four times a day (QID) | RESPIRATORY_TRACT | 0 refills | Status: DC | PRN

## 2016-09-09 MED ORDER — METHYLPREDNISOLONE ACETATE 80 MG/ML IJ SUSP
80.0000 mg | Freq: Once | INTRAMUSCULAR | Status: AC
  Administered 2016-09-09: 80 mg via INTRAMUSCULAR

## 2016-09-09 NOTE — Progress Notes (Signed)
   Subjective:    [name], female    DOB: 08-05-1950, 66 y.o.   MRN: [id number]  HPI Patient comes into the office complaining of the sensation of something in her throat that makes it hard for her to breathe.  She says she can rest and cough and that helps for about 30 minutes to 1 hour.  She has noticed this has been a problem since about 1 week ago when she was mowing the yard without wearing a mask.  Patient takes Zyrtec for allergies and she has run out and not filled her prescription yet.    She states she was given an inhaler years ago and told she had asthmatic bronchitis.  The inhaler helped with her breathing, but she has run out of the prescription and has not been back to the doctor to replace this.   Review of Systems  Respiratory: Positive for cough (productive, green), shortness of breath and wheezing. Negative for chest tightness.   Cardiovascular: Negative for chest pain and palpitations.  All other systems reviewed and are negative.      Objective:   Physical Exam  Constitutional: She is oriented to person, place, and time. She appears well-developed and well-nourished. No distress.  Neck: Normal range of motion. Neck supple.  Cardiovascular: Normal rate, regular rhythm and normal heart sounds.   No murmur heard. Pulmonary/Chest: Effort normal. No respiratory distress. She has wheezes (inspiratory and expiratory).  Lymphadenopathy:    She has no cervical adenopathy.  Neurological: She is alert and oriented to person, place, and time.  Psychiatric: She has a normal mood and affect. Her behavior is normal. Judgment and thought content normal.   BP 138/64   Pulse 67   Temp 98.3 F (36.8 C) (Oral)   Ht 4\' 10"  (1.473 m)   Wt 171 lb (77.6 kg)   BMI 35.74 kg/m   S/P xopenenx treatment- looser breath sounds with only expiratory wheezes throughout.      Assessment & Plan:  1. SOB (shortness of breath) - levalbuterol (XOPENEX) nebulizer solution  1.25 mg; Take 1.25 mg by nebulization once.  2. Mild intermittent asthmatic bronchitis with acute exacerbation Wear a mask when mowing yard - budesonide-formoterol (SYMBICORT) 80-4.5 MCG/ACT inhaler; Inhale 2 puffs into the lungs 2 (two) times daily.  Dispense: 1 Inhaler; Refill: 12 - methylPREDNISolone acetate (DEPO-MEDROL) injection 80 mg; Inject 1 mL (80 mg total) into the muscle once. - predniSONE (DELTASONE) 20 MG tablet; 2 po at sametime daily for 5 days- start tomorrow  Dispense: 10 tablet; Refill: 0 - albuterol (PROVENTIL HFA;VENTOLIN HFA) 108 (90 Base) MCG/ACT inhaler; Inhale 2 puffs into the lungs every 6 (six) hours as needed for wheezing or shortness of breath.  Dispense: 1 Inhaler; Refill: 0  Mary-Margaret Hassell Done, FNP

## 2016-09-09 NOTE — Telephone Encounter (Signed)
Please give symbicort 80 sample x2

## 2016-09-09 NOTE — Patient Instructions (Signed)
Bronchospasm, Adult Bronchospasm is a tightening of the airways going into the lungs. During an episode, it may be harder to breathe. You may cough, and you may make a whistling sound when you breathe (wheeze). This condition often affects people with asthma. What are the causes? This condition is caused by swelling and irritation in the airways. It can be triggered by:  An infection (common).  Seasonal allergies.  An allergic reaction.  Exercise.  Irritants. These include pollution, cigarette smoke, strong odors, aerosol sprays, and paint fumes.  Weather changes. Winds increase molds and pollens in the air. Cold air may cause swelling.  Stress and emotional upset.  What are the signs or symptoms? Symptoms of this condition include:  Wheezing. If the episode was triggered by an allergy, wheezing may start right away or hours later.  Nighttime coughing.  Frequent or severe coughing with a simple cold.  Chest tightness.  Shortness of breath.  Decreased ability to exercise.  How is this diagnosed? This condition is usually diagnosed with a review of your medical history and a physical exam. Tests, such as lung function tests, are sometimes done to look for other conditions. The need for a chest X-ray depends on where the wheezing occurs and whether it is the first time you have wheezed. How is this treated? This condition may be treated with:  Inhaled medicines. These open up the airways and help you breathe. They can be taken with an inhaler or a nebulizer device.  Corticosteroid medicines. These may be given for severe bronchospasm, usually when it is associated with asthma.  Avoiding triggers, such as irritants, infection, or allergies.  Follow these instructions at home: Medicines  Take over-the-counter and prescription medicines only as told by your health care provider.  If you need to use an inhaler or nebulizer to take your medicine, ask your health care  provider to explain how to use it correctly. If you were given a spacer, always use it with your inhaler. Lifestyle  Reduce the number of triggers in your home. To do this: ? Change your heating and air conditioning filter at least once a month. ? Limit your use of fireplaces and wood stoves. ? Do not smoke. Do not allow smoking in your home. ? Avoid using perfumes and fragrances. ? Get rid of pests, such as roaches and mice, and their droppings. ? Remove any mold from your home. ? Keep your house clean and dust free. Use unscented cleaning products. ? Replace carpet with wood, tile, or vinyl flooring. Carpet can trap dander and dust. ? Use allergy-proof pillows, mattress covers, and box spring covers. ? Wash bed sheets and blankets every week in hot water. Dry them in a dryer. ? Use blankets that are made of polyester or cotton. ? Wash your hands often. ? Do not allow pets in your bedroom.  Avoid breathing in cold air when you exercise. General instructions  Have a plan for seeking medical care. Know when to call your health care provider and local emergency services, and where to get emergency care.  Stay up to date on your immunizations.  When you have an episode of bronchospasm, stay calm. Try to relax and breathe more slowly.  If you have asthma, make sure you have an asthma action plan.  Keep all follow-up visits as told by your health care provider. This is important. Contact a health care provider if:  You have muscle aches.  You have chest pain.  The mucus that you   cough up (sputum) changes from clear or white to yellow, green, gray, or bloody.  You have a fever.  Your sputum gets thicker. Get help right away if:  Your wheezing and coughing get worse, even after you take your prescribed medicines.  It gets even harder to breathe.  You develop severe chest pain. Summary  Bronchospasm is a tightening of the airways going into the lungs.  During an episode of  bronchospasm, you may have a harder time breathing. You may cough and make a whistling sound when you breathe (wheeze).  Avoid exposure to triggers such as smoke, dust, mold, animal dander, and fragrances.  When you have an episode of bronchospasm, stay calm. Try to relax and breathe more slowly. This information is not intended to replace advice given to you by your health care provider. Make sure you discuss any questions you have with your health care provider. Document Released: 02/04/2003 Document Revised: 01/29/2016 Document Reviewed: 01/29/2016 Elsevier Interactive Patient Education  2017 Elsevier Inc.  

## 2016-09-10 NOTE — Telephone Encounter (Signed)
Put up front and patient aware.

## 2016-11-03 ENCOUNTER — Other Ambulatory Visit: Payer: Self-pay | Admitting: Pediatrics

## 2016-11-03 DIAGNOSIS — F4323 Adjustment disorder with mixed anxiety and depressed mood: Secondary | ICD-10-CM

## 2016-11-03 DIAGNOSIS — G629 Polyneuropathy, unspecified: Secondary | ICD-10-CM

## 2016-12-02 ENCOUNTER — Encounter: Payer: Self-pay | Admitting: Pediatrics

## 2016-12-02 ENCOUNTER — Ambulatory Visit (INDEPENDENT_AMBULATORY_CARE_PROVIDER_SITE_OTHER): Payer: Medicare Other | Admitting: Pediatrics

## 2016-12-02 VITALS — BP 131/76 | HR 66 | Temp 98.2°F | Ht <= 58 in | Wt 277.6 lb

## 2016-12-02 DIAGNOSIS — R590 Localized enlarged lymph nodes: Secondary | ICD-10-CM | POA: Diagnosis not present

## 2016-12-02 DIAGNOSIS — Z780 Asymptomatic menopausal state: Secondary | ICD-10-CM | POA: Diagnosis not present

## 2016-12-02 DIAGNOSIS — R296 Repeated falls: Secondary | ICD-10-CM

## 2016-12-02 DIAGNOSIS — R7303 Prediabetes: Secondary | ICD-10-CM

## 2016-12-02 DIAGNOSIS — E785 Hyperlipidemia, unspecified: Secondary | ICD-10-CM

## 2016-12-02 DIAGNOSIS — G629 Polyneuropathy, unspecified: Secondary | ICD-10-CM | POA: Diagnosis not present

## 2016-12-02 DIAGNOSIS — F4323 Adjustment disorder with mixed anxiety and depressed mood: Secondary | ICD-10-CM

## 2016-12-02 DIAGNOSIS — I10 Essential (primary) hypertension: Secondary | ICD-10-CM | POA: Diagnosis not present

## 2016-12-02 DIAGNOSIS — J4521 Mild intermittent asthma with (acute) exacerbation: Secondary | ICD-10-CM

## 2016-12-02 DIAGNOSIS — J449 Chronic obstructive pulmonary disease, unspecified: Secondary | ICD-10-CM

## 2016-12-02 LAB — BAYER DCA HB A1C WAIVED: HB A1C (BAYER DCA - WAIVED): 5.8 % (ref <7.0)

## 2016-12-02 MED ORDER — GABAPENTIN 300 MG PO CAPS: ORAL_CAPSULE | ORAL | 4 refills | Status: DC

## 2016-12-02 MED ORDER — ALBUTEROL SULFATE HFA 108 (90 BASE) MCG/ACT IN AERS: 2.0000 | INHALATION_SPRAY | Freq: Four times a day (QID) | RESPIRATORY_TRACT | 0 refills | Status: DC | PRN

## 2016-12-02 MED ORDER — BUDESONIDE-FORMOTEROL FUMARATE 80-4.5 MCG/ACT IN AERO: 2.0000 | INHALATION_SPRAY | Freq: Two times a day (BID) | RESPIRATORY_TRACT | 12 refills | Status: DC

## 2016-12-02 MED ORDER — CITALOPRAM HYDROBROMIDE 40 MG PO TABS: 40.0000 mg | ORAL_TABLET | Freq: Every day | ORAL | 1 refills | Status: DC

## 2016-12-02 NOTE — Progress Notes (Signed)
Subjective:   [name], female    DOB: January 26, 1951, 66 y.o.   MRN: [id number] CC: Medication Refiil  HPI: Joyce Ross is a 66 y.o. female presenting for Medication Refiil  Has lost her balance several times in the past week, has had two falls in the past month Legs dont feel weak, thinks once she tripped on something Is having more low back pain, pain goes down her legs at times  Neuropathy: gabapentin helping Taking 3 times a day  COPD: breathing has been "labored" at times Last week felt like she needed her albuterol but has not been able to afford it Almost went ot the hospital for breathing Breathing has not been that bad since then.   Elevated BMI: drinking 2 cups Dr pepper a day Keeps her 61yo granddaughters, stays active with them  Appetite has been ok  Depression: mood has been fine, thinks citalopram helping a lot  Relevant past medical, surgical, family and social history reviewed. Allergies and medications reviewed and updated. History  Smoking Status  . Former Smoker  . Types: Cigarettes  . Quit date: 10/09/2012  Smokeless Tobacco  . Never Used   ROS: Per HPI   Objective:    BP 131/76   Pulse 66   Temp 98.2 F (36.8 C) (Oral)   Ht '4\' 10"'  (1.473 m)   Wt 277 lb 9.6 oz (125.9 kg)   BMI 58.02 kg/m   Wt Readings from Last 3 Encounters:  12/02/16 277 lb 9.6 oz (125.9 kg)  09/09/16 171 lb (77.6 kg)  07/15/16 172 lb (78 kg)    Gen: NAD, alert, cooperative with exam, NCAT EYES: EOMI, no conjunctival injection, or no icterus ENT:  TMs pearly gray b/l, OP without erythema LYMPH: a couple of apprx 1 cm mobile, non-tender supraclavicular lymph nodes present b/l, no cervical LAD CV: NRRR, normal S1/S2, no murmur, distal pulses 2+ b/l Resp: CTABL, no wheezes, normal WOB Abd: +BS, soft, NTND. no guarding or organomegaly Ext: trace pitting edema b/l, warm  Neuro: Alert and oriented, coordination grossly normal  Assessment & Plan:  Joyce Ross  was seen today for medication refiil.  Diagnoses and all orders for this visit:  Essential hypertension Not currently on medication -     CMP14+EGFR  Adjustment disorder with mixed anxiety and depressed mood Much improved when on below, cont -     citalopram (CELEXA) 40 MG tablet; Take 1 tablet (40 mg total) by mouth daily.  Hyperlipidemia, unspecified hyperlipidemia type -     Lipid panel  Pre-diabetes Cont wt loss, lifestyle changes -     Bayer DCA Hb A1c Waived  Neuropathy Stable, below helps -     gabapentin (NEURONTIN) 300 MG capsule; TAKE ONE CAPSULE BY MOUTH three TIMES A DAY  Chronic obstructive pulmonary disease, unspecified COPD type (HCC) -     AMB Referral to [facility] Ongoing symptoms, not on medication, gave samples of symbicort today Refer to [facility] for medication assistance -     albuterol (PROVENTIL HFA;VENTOLIN HFA) 108 (90 Base) MCG/ACT inhaler; Inhale 2 puffs into the lungs every 6 (six) hours as needed for wheezing or shortness of breath. -     budesonide-formoterol (SYMBICORT) 80-4.5 MCG/ACT inhaler; Inhale 2 puffs into the lungs 2 (two) times daily.  Localized enlarged lymph nodes Didn't go for CT scan when last ordered, still with large LN supraclavicular, smoking history -     CT Chest Wo Contrast; Future  Post-menopausal -  DG [facility] DEXA  Frequent falls -     Ambulatory referral to Physical Therapy   Follow up plan: Return in about 4 months (around 04/04/2017). Assunta Found, MD [place]

## 2016-12-03 LAB — CMP14+EGFR
ALT: 13 IU/L (ref 0–32)
AST: 17 IU/L (ref 0–40)
Albumin/Globulin Ratio: 1.6 (ref 1.2–2.2)
Albumin: 4.4 g/dL (ref 3.6–4.8)
Alkaline Phosphatase: 111 IU/L (ref 39–117)
BUN/Creatinine Ratio: 18 (ref 12–28)
BUN: 17 mg/dL (ref 8–27)
Bilirubin Total: 0.3 mg/dL (ref 0.0–1.2)
CO2: 26 mmol/L (ref 20–29)
Calcium: 9.3 mg/dL (ref 8.7–10.3)
Chloride: 99 mmol/L (ref 96–106)
Creatinine, Ser: 0.95 mg/dL (ref 0.57–1.00)
GFR calc Af Amer: 72 mL/min/{1.73_m2} (ref >59)
GFR calc non Af Amer: 63 mL/min/{1.73_m2} (ref >59)
Globulin, Total: 2.7 g/dL (ref 1.5–4.5)
Glucose: 58 mg/dL — ABNORMAL LOW (ref 65–99)
Potassium: 4.3 mmol/L (ref 3.5–5.2)
Sodium: 138 mmol/L (ref 134–144)
Total Protein: 7.1 g/dL (ref 6.0–8.5)

## 2016-12-03 LAB — LIPID PANEL
Chol/HDL Ratio: 6.2 ratio — ABNORMAL HIGH (ref 0.0–4.4)
Cholesterol, Total: 260 mg/dL — ABNORMAL HIGH (ref 100–199)
HDL: 42 mg/dL (ref >39)
LDL Calculated: 184 mg/dL — ABNORMAL HIGH (ref 0–99)
Triglycerides: 169 mg/dL — ABNORMAL HIGH (ref 0–149)
VLDL Cholesterol Cal: 34 mg/dL (ref 5–40)

## 2016-12-05 ENCOUNTER — Other Ambulatory Visit: Payer: Self-pay | Admitting: Pediatrics

## 2016-12-07 ENCOUNTER — Telehealth: Payer: Self-pay | Admitting: *Deleted

## 2016-12-07 NOTE — Telephone Encounter (Signed)
Called to give patient number to [facility]/ Patient assistance [phone]

## 2016-12-14 ENCOUNTER — Ambulatory Visit (HOSPITAL_COMMUNITY): Payer: Medicare Other

## 2016-12-16 MED ORDER — PRAVASTATIN SODIUM 80 MG PO TABS: 80.0000 mg | ORAL_TABLET | Freq: Every day | ORAL | 3 refills | Status: DC

## 2016-12-16 NOTE — Addendum Note (Signed)
Addended byFaylene Million C on: 12/16/2016 10:04 AM   Modules accepted: Orders

## 2017-01-21 ENCOUNTER — Telehealth: Payer: Self-pay | Admitting: Pediatrics

## 2017-01-21 DIAGNOSIS — K219 Gastro-esophageal reflux disease without esophagitis: Secondary | ICD-10-CM

## 2017-01-21 MED ORDER — RANITIDINE HCL 150 MG PO TABS: 150.0000 mg | ORAL_TABLET | Freq: Two times a day (BID) | ORAL | 1 refills | Status: DC

## 2017-01-21 NOTE — Telephone Encounter (Signed)
Rx sent to pharmacy for patient.

## 2017-01-21 NOTE — Telephone Encounter (Signed)
What is the name of the medication? Ranitidine 150mg    Have you contacted your pharmacy to request a refill? No, it is not on file at [place] would you like this sent to? [facility] [place]   Patient notified that their request is being sent to the clinical staff for review and that they should receive a call once it is complete. If they do not receive a call within 24 hours they can check with their pharmacy or our office.

## 2017-05-30 ENCOUNTER — Other Ambulatory Visit: Payer: Self-pay | Admitting: Pediatrics

## 2017-05-31 NOTE — Telephone Encounter (Signed)
Last seen 12/02/16  Dr Evette Doffing

## 2017-06-09 ENCOUNTER — Encounter: Payer: Self-pay | Admitting: Pediatrics

## 2017-06-09 ENCOUNTER — Ambulatory Visit (INDEPENDENT_AMBULATORY_CARE_PROVIDER_SITE_OTHER): Payer: Medicare Other | Admitting: Pediatrics

## 2017-06-09 VITALS — BP 138/76 | HR 81 | Temp 99.9°F | Resp 22 | Ht <= 58 in | Wt 174.8 lb

## 2017-06-09 DIAGNOSIS — E785 Hyperlipidemia, unspecified: Secondary | ICD-10-CM

## 2017-06-09 DIAGNOSIS — R609 Edema, unspecified: Secondary | ICD-10-CM

## 2017-06-09 DIAGNOSIS — J449 Chronic obstructive pulmonary disease, unspecified: Secondary | ICD-10-CM | POA: Diagnosis not present

## 2017-06-09 DIAGNOSIS — G629 Polyneuropathy, unspecified: Secondary | ICD-10-CM | POA: Diagnosis not present

## 2017-06-09 DIAGNOSIS — J309 Allergic rhinitis, unspecified: Secondary | ICD-10-CM

## 2017-06-09 DIAGNOSIS — R590 Localized enlarged lymph nodes: Secondary | ICD-10-CM

## 2017-06-09 DIAGNOSIS — J4521 Mild intermittent asthma with (acute) exacerbation: Secondary | ICD-10-CM | POA: Diagnosis not present

## 2017-06-09 DIAGNOSIS — F339 Major depressive disorder, recurrent, unspecified: Secondary | ICD-10-CM | POA: Diagnosis not present

## 2017-06-09 DIAGNOSIS — K219 Gastro-esophageal reflux disease without esophagitis: Secondary | ICD-10-CM

## 2017-06-09 MED ORDER — RANITIDINE HCL 150 MG PO TABS: 150.0000 mg | ORAL_TABLET | Freq: Two times a day (BID) | ORAL | 1 refills | Status: DC

## 2017-06-09 MED ORDER — FUROSEMIDE 20 MG PO TABS: 20.0000 mg | ORAL_TABLET | Freq: Every day | ORAL | 1 refills | Status: DC | PRN

## 2017-06-09 MED ORDER — CYCLOBENZAPRINE HCL 10 MG PO TABS: 10.0000 mg | ORAL_TABLET | Freq: Three times a day (TID) | ORAL | 2 refills | Status: DC | PRN

## 2017-06-09 MED ORDER — BUPROPION HCL 75 MG PO TABS: 75.0000 mg | ORAL_TABLET | Freq: Two times a day (BID) | ORAL | 2 refills | Status: DC

## 2017-06-09 MED ORDER — CITALOPRAM HYDROBROMIDE 40 MG PO TABS: 40.0000 mg | ORAL_TABLET | Freq: Every day | ORAL | 1 refills | Status: DC

## 2017-06-09 MED ORDER — GABAPENTIN 300 MG PO CAPS: ORAL_CAPSULE | ORAL | 4 refills | Status: DC

## 2017-06-09 MED ORDER — PRAVASTATIN SODIUM 80 MG PO TABS: 80.0000 mg | ORAL_TABLET | Freq: Every day | ORAL | 3 refills | Status: DC

## 2017-06-09 MED ORDER — BUDESONIDE-FORMOTEROL FUMARATE 80-4.5 MCG/ACT IN AERO: 2.0000 | INHALATION_SPRAY | Freq: Two times a day (BID) | RESPIRATORY_TRACT | 12 refills | Status: DC

## 2017-06-09 MED ORDER — ALBUTEROL SULFATE HFA 108 (90 BASE) MCG/ACT IN AERS: 2.0000 | INHALATION_SPRAY | Freq: Four times a day (QID) | RESPIRATORY_TRACT | 0 refills | Status: DC | PRN

## 2017-06-09 MED ORDER — CETIRIZINE HCL 10 MG PO TABS: 10.0000 mg | ORAL_TABLET | Freq: Every day | ORAL | 11 refills | Status: DC

## 2017-06-09 NOTE — Progress Notes (Signed)
Subjective:   [name], female    DOB: 1951-02-05, 67 y.o.   MRN: [id number] CC: Follow-up (6 month) Multiple medical problems HPI: Joyce Ross is a 67 y.o. female presenting for Follow-up (32 month)  Started smoking at age 45yo, smoked about a pack a day until she quit at age 41 years old.  COPD: Taking Symbicort regularly.  Sometimes short of breath with activity.  Watching her 71-year-old granddaughters several days a week.  Appetite has been fine.  Increased stress at home recently.  Citalopram has been helping, would like something else to help with her mood.  She is more irritable than she would like to be with her grandchildren.  Sometimes is a harder time controlling her anger.  Feels safe at home.  No thoughts of self-harm.  Has not been harming anyone else that she says.  Hyperlipidemia: Tolerating statin well.  Symptoms of swelling in her feet, worse at the end of the day.  Not happening every day.  Usually gets better by the morning.  If you feel numb and tingly when this happens.  No chest pain or tightness.  Prediabetes: Drinking Joyce Ross most days.  Has been cutting back.  Relevant past medical, surgical, family and social history reviewed. Allergies and medications reviewed and updated. Social History   Tobacco Use  Smoking Status Former Smoker  . Types: Cigarettes  . Last attempt to quit: 10/09/2012  . Years since quitting: 4.6  Smokeless Tobacco Never Used   ROS: Per HPI   Objective:    BP 138/76   Pulse 81   Temp 99.9 F (37.7 C) (Oral)   Resp (!) 22   Ht 4\' 10"  (1.473 m)   Wt 174 lb 12.8 oz (79.3 kg)   SpO2 94%   BMI 36.53 kg/m   Wt Readings from Last 3 Encounters:  06/09/17 174 lb 12.8 oz (79.3 kg)  12/02/16 277 lb 9.6 oz (125.9 kg)  09/09/16 171 lb (77.6 kg)    Gen: NAD, alert, cooperative with exam, NCAT EYES: EOMI, no conjunctival injection, or no icterus ENT:  TMs pearly gray b/l, OP without erythema LYMPH: Bilateral  supra clavicular lymphadenopathy, mobile.  Approximately 5 to 10 mm. no cervical LAD CV: normal Q2/V9, II/VI systolic ejection murmur right upper sternal border.   distal pulses 2+ b/l Resp: CTABL, no wheezes, normal WOB Abd: +BS, soft, NTND. no guarding or organomegaly Ext: Nonpitting edema present bilateral feet, warm Neuro: Alert and oriented MSK: normal muscle bulk  Assessment & Plan:  Joyce Ross was seen today for follow-up multiple medical problems  Diagnoses and all orders for this visit:  Supraclavicular adenopathy 40-pack-year history of smoking. Patient did not follow-up with CT scan ordered at last visit.  Open to getting it done now.  Will have someone call to schedule.  Chronic obstructive pulmonary disease, unspecified COPD type (HCC) -     albuterol (PROVENTIL HFA;VENTOLIN HFA) 108 (90 Base) MCG/ACT inhaler; Inhale 2 puffs into the lungs every 6 (six) hours as needed for wheezing or shortness of breath. -     AMB Referral to [facility]  Allergic rhinitis, unspecified seasonality, unspecified trigger -     cetirizine (ZYRTEC) 10 MG tablet; Take 1 tablet (10 mg total) by mouth daily.  Neuropathy -     gabapentin (NEURONTIN) 300 MG capsule; TAKE ONE CAPSULE BY MOUTH three TIMES A DAY  Gastroesophageal reflux disease, esophagitis presence not specified -     ranitidine (ZANTAC) 150 MG  tablet; Take 1 tablet (150 mg total) by mouth 2 (two) times daily.  Mild intermittent asthmatic bronchitis with acute exacerbation Stable, continue below -     budesonide-formoterol (SYMBICORT) 80-4.5 MCG/ACT inhaler; Inhale 2 puffs into the lungs 2 (two) times daily.  Swelling Tends to get worse in the summer per patient.  If still swollen in the morning can try below.  Any worsening needs to be seen. -     furosemide (LASIX) 20 MG tablet; Take 1 tablet (20 mg total) by mouth daily as needed for fluid.  Depression, recurrent (Joyce Ross) Ongoing symptoms, add Wellbutrin -      citalopram (CELEXA) 40 MG tablet; Take 1 tablet (40 mg total) by mouth daily. -     buPROPion (WELLBUTRIN) 75 MG tablet; Take 1 tablet (75 mg total) by mouth 2 (two) times daily.  Hyperlipidemia, unspecified hyperlipidemia type -     pravastatin (PRAVACHOL) 80 MG tablet; Take 1 tablet (80 mg total) by mouth at bedtime.  Other orders -     cyclobenzaprine (FLEXERIL) 10 MG tablet; Take 1 tablet (10 mg total) by mouth 3 (three) times daily as needed. for muscle spams   Follow up plan: Return in about 3 months (around 09/08/2017). Joyce Found, MD [place]

## 2017-06-16 ENCOUNTER — Other Ambulatory Visit: Payer: Self-pay

## 2017-06-16 NOTE — Patient Outreach (Signed)
Joyce Ross) Care Management  06/16/2017  Joyce Ross 1950/05/04 [id number]   TELEPHONE SCREENING Referral date: 06/09/17 Referral source: primary MD Referral reason: medication assistance Insurance: medicare Attempt #1  Telephone call to patient regarding primary MD referral. Unable to reach patient HIPAA compliant voice message left with call back phone number.   PLAN: RNCM will attempt 2nd telephone call to patient within 4 business days.  RNCM will send patient outreach letter to attempt contact.   Quinn Plowman RN,BSN,CCM [facility] Telephonic  [phone]

## 2017-06-22 ENCOUNTER — Other Ambulatory Visit: Payer: Self-pay

## 2017-06-22 NOTE — Patient Outreach (Signed)
[facility]) Care Management  06/22/2017  Joyce Ross [birth year] [id number]    TELEPHONE SCREENING Referral date: 06/09/17 Referral source: primary MD Referral reason: medication assistance Insurance: medicare Attempt #2  Telephone call to patient regarding  Primary MD referral.  Unable to reach patient. HIPAA compliant voice message left with call back phone number.  PLAN; RNCM will attempt 3rd  telephone call to patient within 4 business days.    Quinn Plowman RN,BSN,CCM [facility] Telephonic  [phone]     .

## 2017-06-27 ENCOUNTER — Ambulatory Visit: Payer: Self-pay

## 2017-06-28 ENCOUNTER — Other Ambulatory Visit: Payer: Self-pay

## 2017-06-28 NOTE — Patient Outreach (Signed)
[facility]) Care Management  06/28/2017  Joyce Ross [birth year] [id number]  TELEPHONE SCREENING Referral date:06/09/17 Referral source:primary MD Referral reason:medication assistance Insurance:medicare  Telephone call to patient regarding primary MD referral. Unable to reach patient. HIPAA compliant voice message left with call back phone number.   PLAN; If no return call from patient will proceed with case closure  Quinn Plowman RN,BSN,CCM [facility] Telephonic  [phone]

## 2017-06-30 ENCOUNTER — Other Ambulatory Visit: Payer: Self-pay

## 2017-06-30 NOTE — Patient Outreach (Signed)
[facility]) Care Management  06/30/2017  Joyce Ross April 08, 1950 [id number]  Case closure No response from patient after 3 telephone calls and outreach letter attempt.  PLAN;  RNCM will close patient due to being unable to reach [name] will send closure notification to patient's primary care provider.  Quinn Plowman RN,BSN,CCM [facility] Telephonic  [phone]

## 2017-09-08 ENCOUNTER — Encounter: Payer: Self-pay | Admitting: Pediatrics

## 2017-09-08 ENCOUNTER — Ambulatory Visit (INDEPENDENT_AMBULATORY_CARE_PROVIDER_SITE_OTHER): Payer: Medicare Other | Admitting: Pediatrics

## 2017-09-08 VITALS — BP 122/55 | HR 68 | Temp 99.0°F | Ht <= 58 in | Wt 182.2 lb

## 2017-09-08 DIAGNOSIS — M25551 Pain in right hip: Secondary | ICD-10-CM

## 2017-09-08 DIAGNOSIS — Z6838 Body mass index (BMI) 38.0-38.9, adult: Secondary | ICD-10-CM

## 2017-09-08 DIAGNOSIS — R590 Localized enlarged lymph nodes: Secondary | ICD-10-CM

## 2017-09-08 DIAGNOSIS — J449 Chronic obstructive pulmonary disease, unspecified: Secondary | ICD-10-CM

## 2017-09-08 NOTE — Progress Notes (Signed)
Subjective:   [name], female    DOB: 1950/09/19, 67 y.o.   MRN: [id number] CC: Medical Management of Chronic Issues (pt here today for 3 month follow up and c/o lower back pain that radiates to both hips and down both legs)  HPI: Joyce Ross is a 67 y.o. female   Can't stand for very long, starts having pain in the middle of her back. Radiates out to hip joints, then radiates down legs. R hip starts hurting if walking too far.  Right hip bothering her the most.  She will feel a twinge in the front of the hip.  Comes and goes throughout the day.  Appetite has been fine, no fevers.  She thinks her joint problems gotten worse since her recent weight gain.  Supraclavicular lymphadenopathy: Says she is been too afraid to get a CT scan.  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  It is hard for her to get coverage for them so she can make it to appointments at times.  She does have a long smoking history, quit 5 years ago.  Elevated BMI: Drinks to Dr. Samson Frederic a day, rice crispies for breakfast with milk, sandwich or half a sandwich with bologna or him on it for lunch with potatoes sticks, barbecue chicken with potato salad or rice for dinner.  Not able to drink water because of the taste.  Relevant past medical, surgical, family and social history reviewed. Allergies and medications reviewed and updated. Social History   Tobacco Use  Smoking Status Former Smoker  . Types: Cigarettes  . Last attempt to quit: 10/09/2012  . Years since quitting: 4.9  Smokeless Tobacco Never Used   ROS: Per HPI   Objective:    BP (!) 122/55   Pulse 68   Temp 99 F (37.2 C) (Oral)   Ht 4\' 10"  (1.473 m)   Wt 182 lb 4 oz (82.7 kg)   BMI 38.09 kg/m   Wt Readings from Last 3 Encounters:  09/08/17 182 lb 4 oz (82.7 kg)  06/09/17 174 lb 12.8 oz (79.3 kg)  12/02/16 277 lb 9.6 oz (125.9 kg)    Gen: NAD, alert, cooperative with exam, NCAT EYES: EOMI, no conjunctival  injection, or no icterus ENT:  TMs pearly gray b/l, OP without erythema LYMPH: no cervical LAD, bilateral several approximately 1 cm supraclavicular lymph nodes present, mobile. CV: NRRR, normal S1/S2, no murmur, distal pulses 2+ b/l Resp: CTABL, no wheezes, normal WOB Abd: +BS, soft, NTND. Ext: No edema, warm Neuro: Alert and oriented, strength equal b/l UE and LE, coordination grossly normal MSK: Pain in front of hip with internal and external rotation of the right hip.  Decreased range of motion right hip compared to left hip.  No pain with left hip range of motion.  Slightly decreased range of motion right knee.  Bony enlargement of the knee.  Slight effusion present.  Assessment & Plan:  Margy was seen today for medical management of chronic issues.  Diagnoses and all orders for this visit:  Right hip pain Hip x-ray normal 1 year ago.  Ongoing pain.  Will refer -     Ambulatory referral to Orthopedic Surgery  Localized enlarged lymph nodes Patient agrees to getting CT scan done to evaluate supra clavicular lymphadenopathy.  She does not hear within a week about her appointment she knows to call us back. -     CT Chest Wo Contrast; Future  BMI 38.0-38.9,adult Continue to encourage lifestyle changes,  decreasing soda intake, sugar intake, increasing fruits and vegetables.  Weight loss likely to help some with her joint pains.  Chronic obstructive pulmonary disease, unspecified COPD type ([place]) Stable symptoms, continue Symbicort  Follow up plan: 3 mo Joyce Found, MD [place]

## 2017-09-19 ENCOUNTER — Ambulatory Visit (HOSPITAL_COMMUNITY): Payer: Medicare Other

## 2017-09-28 ENCOUNTER — Ambulatory Visit (HOSPITAL_COMMUNITY)
Admission: RE | Admit: 2017-09-28 | Discharge: 2017-09-28 | Disposition: A | Discharge status: Home / Self Care | Payer: Medicare Other | Source: Ambulatory Visit | Attending: Pediatrics | Admitting: Pediatrics

## 2017-09-28 DIAGNOSIS — E0789 Other specified disorders of thyroid: Secondary | ICD-10-CM | POA: Insufficient documentation

## 2017-09-28 DIAGNOSIS — I2510 Atherosclerotic heart disease of native coronary artery without angina pectoris: Secondary | ICD-10-CM | POA: Insufficient documentation

## 2017-09-28 DIAGNOSIS — I712 Thoracic aortic aneurysm, without rupture: Secondary | ICD-10-CM | POA: Diagnosis not present

## 2017-09-28 DIAGNOSIS — I700 Atherosclerosis of aorta: Secondary | ICD-10-CM | POA: Diagnosis not present

## 2017-09-28 DIAGNOSIS — N261 Atrophy of kidney (terminal): Secondary | ICD-10-CM | POA: Insufficient documentation

## 2017-09-28 DIAGNOSIS — R590 Localized enlarged lymph nodes: Secondary | ICD-10-CM | POA: Diagnosis not present

## 2017-09-28 HISTORY — DX: Atherosclerotic heart disease of native coronary artery without angina pectoris: I25.10

## 2017-09-29 ENCOUNTER — Other Ambulatory Visit: Payer: Self-pay | Admitting: Pediatrics

## 2017-09-29 DIAGNOSIS — E041 Nontoxic single thyroid nodule: Secondary | ICD-10-CM

## 2017-09-29 DIAGNOSIS — I2510 Atherosclerotic heart disease of native coronary artery without angina pectoris: Secondary | ICD-10-CM

## 2017-09-29 DIAGNOSIS — N1330 Unspecified hydronephrosis: Secondary | ICD-10-CM

## 2017-09-29 MED ORDER — METOPROLOL TARTRATE 25 MG PO TABS: 12.5000 mg | ORAL_TABLET | Freq: Two times a day (BID) | ORAL | 3 refills | Status: DC

## 2017-10-06 ENCOUNTER — Ambulatory Visit (HOSPITAL_COMMUNITY): Payer: Medicare Other | Attending: Pediatrics

## 2017-10-06 ENCOUNTER — Ambulatory Visit (HOSPITAL_COMMUNITY): Payer: Medicare Other

## 2017-10-07 ENCOUNTER — Encounter: Payer: Self-pay | Admitting: Pediatrics

## 2017-10-07 DIAGNOSIS — N1330 Unspecified hydronephrosis: Secondary | ICD-10-CM | POA: Insufficient documentation

## 2017-10-07 DIAGNOSIS — E041 Nontoxic single thyroid nodule: Secondary | ICD-10-CM | POA: Insufficient documentation

## 2017-10-07 DIAGNOSIS — I712 Thoracic aortic aneurysm, without rupture, unspecified: Secondary | ICD-10-CM | POA: Insufficient documentation

## 2017-10-19 ENCOUNTER — Ambulatory Visit (HOSPITAL_COMMUNITY)
Admission: RE | Admit: 2017-10-19 | Discharge: 2017-10-19 | Disposition: A | Discharge status: Home / Self Care | Payer: Medicare Other | Source: Ambulatory Visit | Attending: Pediatrics | Admitting: Pediatrics

## 2017-10-19 DIAGNOSIS — N1330 Unspecified hydronephrosis: Secondary | ICD-10-CM | POA: Diagnosis not present

## 2017-10-19 DIAGNOSIS — E041 Nontoxic single thyroid nodule: Secondary | ICD-10-CM | POA: Diagnosis not present

## 2017-10-19 DIAGNOSIS — E042 Nontoxic multinodular goiter: Secondary | ICD-10-CM | POA: Diagnosis not present

## 2017-10-19 DIAGNOSIS — N132 Hydronephrosis with renal and ureteral calculous obstruction: Secondary | ICD-10-CM | POA: Diagnosis not present

## 2017-10-21 ENCOUNTER — Other Ambulatory Visit: Payer: Self-pay | Admitting: Pediatrics

## 2017-10-21 DIAGNOSIS — E041 Nontoxic single thyroid nodule: Secondary | ICD-10-CM

## 2017-10-21 DIAGNOSIS — N1330 Unspecified hydronephrosis: Secondary | ICD-10-CM

## 2017-10-25 ENCOUNTER — Ambulatory Visit (INDEPENDENT_AMBULATORY_CARE_PROVIDER_SITE_OTHER): Payer: Medicare Other | Admitting: *Deleted

## 2017-10-25 ENCOUNTER — Ambulatory Visit (INDEPENDENT_AMBULATORY_CARE_PROVIDER_SITE_OTHER): Payer: Medicare Other

## 2017-10-25 VITALS — BP 139/71 | HR 62 | Ht <= 58 in | Wt 184.4 lb

## 2017-10-25 DIAGNOSIS — Z0000 Encounter for general adult medical examination without abnormal findings: Secondary | ICD-10-CM

## 2017-10-25 DIAGNOSIS — Z780 Asymptomatic menopausal state: Secondary | ICD-10-CM

## 2017-10-25 DIAGNOSIS — Z1211 Encounter for screening for malignant neoplasm of colon: Secondary | ICD-10-CM

## 2017-10-25 DIAGNOSIS — Z23 Encounter for immunization: Secondary | ICD-10-CM | POA: Diagnosis not present

## 2017-10-25 DIAGNOSIS — Z1239 Encounter for other screening for malignant neoplasm of breast: Secondary | ICD-10-CM

## 2017-10-25 NOTE — Progress Notes (Addendum)
Subjective:   Joyce Ross is a 67 y.o. female who presents for an Initial Medicare Annual Wellness Visit.  Joyce Ross is a widowed female who lives at home with her son and 2 cats. She has 1 other child and 2 grandchildren that she sees daily.  Joyce Ross previously worked at [employer] for 36 years until all jobs were moved to [place].  She enjoys reading, knitting and playing with grandchildren.  She is a member of the [employer]. Joyce Ross does not have a daily exercise routine and eats 3 unhealthy meals daily.  She has had no ER visits, hospitalizations or surgeries in the past year and overall feels that her health is worse than it was a year ago due to back pain.   Objective:    Today's Vitals   10/25/17 0825 10/25/17 0831  BP: (!) 141/66 139/71  Pulse: 62 62  Weight: 184 lb 6.4 oz (83.6 kg)   Height: 4\' 10"  (1.473 m)    Body mass index is 38.54 kg/m.  Advanced Directives 10/25/2017  Does Patient Have a Medical Advance Directive? No  Would patient like information on creating a medical advance directive? Yes (MAU/Ambulatory/Procedural Areas - Information given)    Current Medications (verified) Outpatient Encounter Medications as of 10/25/2017  Medication Sig  . budesonide-formoterol (SYMBICORT) 80-4.5 MCG/ACT inhaler Inhale 2 puffs into the lungs 2 (two) times daily.  Marland Kitchen buPROPion (WELLBUTRIN) 75 MG tablet Take 1 tablet (75 mg total) by mouth 2 (two) times daily.  . cetirizine (ZYRTEC) 10 MG tablet Take 1 tablet (10 mg total) by mouth daily.  . citalopram (CELEXA) 40 MG tablet Take 1 tablet (40 mg total) by mouth daily.  . cyclobenzaprine (FLEXERIL) 10 MG tablet Take 1 tablet (10 mg total) by mouth 3 (three) times daily as needed. for muscle spams  . fluticasone (FLONASE) 50 MCG/ACT nasal spray Place 2 sprays into both nostrils daily.  . furosemide (LASIX) 20 MG tablet Take 1 tablet (20 mg total) by mouth daily as needed for fluid.  Marland Kitchen  gabapentin (NEURONTIN) 300 MG capsule TAKE ONE CAPSULE BY MOUTH three TIMES A DAY  . ibuprofen (ADVIL,MOTRIN) 200 MG tablet Take 200 mg by mouth every 6 (six) hours as needed.  . Melatonin 1 MG TABS Take 0.5 tablets by mouth at bedtime as needed.  . pravastatin (PRAVACHOL) 80 MG tablet Take 1 tablet (80 mg total) by mouth at bedtime.  . ranitidine (ZANTAC) 150 MG tablet Take 1 tablet (150 mg total) by mouth 2 (two) times daily.  . metoprolol tartrate (LOPRESSOR) 25 MG tablet Take 0.5 tablets (12.5 mg total) by mouth 2 (two) times daily. (Patient not taking: Reported on 10/25/2017)  . [DISCONTINUED] albuterol (PROVENTIL HFA;VENTOLIN HFA) 108 (90 Base) MCG/ACT inhaler Inhale 2 puffs into the lungs every 6 (six) hours as needed for wheezing or shortness of breath. (Patient not taking: Reported on 10/25/2017)   No facility-administered encounter medications on file as of 10/25/2017.   Symbicort Samples given today  Allergies (verified) Penicillins; Radish [raphanus sativus]; and Yellow jacket venom   History: Past Medical History:  Diagnosis Date  . Bell's palsy   . Bladder infection    FREQUENT  . GERD (gastroesophageal reflux disease)   . Hyperlipidemia   . Hypertension   . Neuropathy    FEET AND LEGS (NON DIABETIC)   Past Surgical History:  Procedure Laterality Date  . BILATERAL CARPAL TUNNEL RELEASE    . CESAREAN SECTION  x 2  . HIP SURGERY Left   . JOINT REPLACEMENT     left knee  . left knee surgery    . MOUTH SURGERY     all teeth pulled  . SPINE SURGERY     cervical   Family History  Problem Relation Age of Onset  . Heart disease Mother   . Heart disease Father        heart attack  . Heart disease Sister   . Diabetes Brother   . Diabetes Brother        sepsis  . Heart disease Brother    Social History   Socioeconomic History  . Marital status: Widowed    Spouse name: Not on file  . Number of children: 2  . Years of education: 12th Grade  . Highest  education level: High school graduate  Occupational History  . Not on file  Social Needs  . Financial resource strain: Not hard at all  . Food insecurity:    Worry: Never true    Inability: Never true  . Transportation needs:    Medical: No    Non-medical: No  Tobacco Use  . Smoking status: Former Smoker    Types: Cigarettes    Last attempt to quit: 10/09/2012    Years since quitting: 5.0  . Smokeless tobacco: Never Used  Substance and Sexual Activity  . Alcohol use: No  . Drug use: No  . Sexual activity: Not on file  Lifestyle  . Physical activity:    Days per week: 0 days    Minutes per session: Not on file  . Stress: Not at all  Relationships  . Social connections:    Talks on phone: More than three times a week    Gets together: More than three times a week    Attends religious service: Never    Active member of club or organization: No    Attends meetings of clubs or organizations: Never    Relationship status: Widowed  Other Topics Concern  . Not on file  Social History Narrative  . Not on file    Tobacco Counseling No tobacco use at this time  Clinical Intake:  Pre-visit preparation completed: Yes  Pain : No/denies pain     BMI - recorded: 38.1 Nutritional Status: BMI > 30  Obese Nutritional Risks: None Diabetes: No  How often do you need to have someone help you when you read instructions, pamphlets, or other written materials from your doctor or pharmacy?: 1 - Never What is the last grade level you completed in school?: High School Graduate  Interpreter Needed?: No  Information entered by :: Truett Mainland, LPN   Activities of Daily Living In your present state of health, do you have any difficulty performing the following activities: 10/25/2017  Hearing? N  Vision? N  Difficulty concentrating or making decisions? N  Walking or climbing stairs? N  Dressing or bathing? N  Doing errands, shopping? N  Some recent data might be hidden  No  difficulties with ADLs at this time  Immunizations and Health Maintenance Immunization History  Administered Date(s) Administered  . Pneumococcal Conjugate-13 10/25/2017  . Tdap 02/05/2013   Health Maintenance Due  Topic Date Due  . MAMMOGRAM  11/26/2000  . DEXA SCAN  11/27/2015  Mammogram ordered and scheduled DEXA scan done today  Patient Care Team: Eustaquio Maize, MD as PCP - General (Pediatrics)  Indicate any recent Medical Services you may have received from  other than [facility] providers in the past year (date may be approximate).     Assessment:   This is a routine wellness examination for [facility].  Hearing/Vision screen No vision or hearing loss noted at this time   Dietary issues and exercise activities discussed: Current Exercise Habits: The patient does not participate in regular exercise at present  Goals    . Exercise 3x per week (30 min per time)     Try to exercise for at least 30 minutes, 3 times weekly    . Have 3 meals a day     Eat 3 meals daily that consist of lean proteins, fruits and vegetables       Depression Screen PHQ 2/9 Scores 10/25/2017 09/08/2017 06/09/2017 12/02/2016 09/09/2016 07/15/2016 06/21/2016  PHQ - 2 Score 2 2 0 0 1 0 0  PHQ- 9 Score 9 8 - - - - -   Some depression noted at this time. Currently on treatment Fall Risk Fall Risk  10/25/2017 09/08/2017 06/09/2017 12/02/2016 09/09/2016  Falls in the past year? Yes No No Yes Yes  Number falls in past yr: 2 or more - - 2 or more 2 or more  Injury with Fall? No - - Yes No  Risk Factor Category  - - - High Fall Risk -  Risk for fall due to : History of fall(s) - - History of fall(s) -  Follow up - - - Falls evaluation completed -    Is the patient's home free of loose throw rugs in walkways, pet beds, electrical cords, etc?   yes      Grab bars in the bathroom? yes      Handrails on the stairs?   yes      Adequate lighting?   yes  Timed Get Up and Go Performed   Cognitive  Function: MMSE - Mini Mental State Exam 10/25/2017  Orientation to time 5  Orientation to Place 5  Registration 3  Attention/ Calculation 5  Recall 3  Language- name 2 objects 2  Language- repeat 1  Language- follow 3 step command 3  Language- read & follow direction 1  Write a sentence 1  Copy design 1  Total score 30  No memory loss noted at this time      Screening Tests Health Maintenance  Topic Date Due  . MAMMOGRAM  11/26/2000  . DEXA SCAN  11/27/2015  . INFLUENZA VACCINE  12/09/2017 (Originally 09/15/2017)  . COLONOSCOPY  12/09/2017 (Originally 11/26/2000)  . PNA vac Low Risk Adult (2 of 2 - PPSV23) 10/26/2018  . TETANUS/TDAP  02/06/2023  . Hepatitis C Screening  Completed  Mammogram Scheduled  Dexa Scan done today 10/25/17 Declined Colonoscopy-FOBT given Flu Vaccine to be done at next office visit  Qualifies for Shingles Vaccine? Yes, Will be done at next office visit Cancer Screenings: Lung: Low Dose CT Chest recommended if Age 48-80 years, 30 pack-year currently smoking OR have quit w/in 15years. Patient does not qualify. Breast: Up to date on Mammogram? No  Scheduled Up to date of Bone Density/Dexa? Yes Done 9/10/189 Colorectal: FOBT Given  Additional Screenings: Hepatitis C Screening:      Plan:   Encouraged Mrs. Kaine to eat 3 meals daily that consist of lean proteins, fruits and vegetables.  Encouraged to try to exercise for at least 30 minutes, 3 times weekly.  Prevnar given today and tolerated well.  FOBTgiven today and encouraged to complete and bring back at next office visit. Mammogram  ordered and scheduled at today visit.  Dexa scan ordered and done today. Advance directive information given and encouraged to look over and have one completed and bring back to this office to be scanned into chart.  Flu vaccine and shingrix to be done at next office visit. Encouraged to keep follow up appointment with Dr. Evette Doffing and to call this office with any further  questions or concerns.    I have personally reviewed and noted the following in the patient's chart:   . Medical and social history . Use of alcohol, tobacco or illicit drugs  . Current medications and supplements . Functional ability and status . Nutritional status . Physical activity . Advanced directives . List of other physicians . Hospitalizations, surgeries, and ER visits in previous 12 months . Vitals . Screenings to include cognitive, depression, and falls . Referrals and appointments  In addition, I have reviewed and discussed with patient certain preventive protocols, quality metrics, and best practice recommendations. A written personalized care plan for preventive services as well as general preventive health recommendations were provided to patient.     Wardell Heath, LPN   3/75/4360   I have reviewed and agree with the above AWV documentation.   Mary-Margaret Hassell Done, FNP

## 2017-10-25 NOTE — Patient Instructions (Addendum)
Bring Back Stool card at next appointment with Dr. Evette Doffing Keep follow up appointment with Dr. Evette Doffing on 12/09/2017 Flu Vaccine and Shingles Vaccine to be done at appointment with Dr. Evette Doffing on 12/09/2017  [name] , Thank you for taking time to come for your Medicare Wellness Visit. I appreciate your ongoing commitment to your health goals. Please review the following plan we discussed and let me know if I can assist you in the future.   These are the goals we discussed: Goals    . Exercise 3x per week (30 min per time)     Try to exercise for at least 30 minutes, 3 times weekly    . Have 3 meals a day     Eat 3 meals daily that consist of lean proteins, fruits and vegetables        This is a list of the screening recommended for you and due dates:  Health Maintenance  Topic Date Due  . Mammogram  11/26/2000  . Colon Cancer Screening  11/26/2000  . DEXA scan (bone density measurement)  11/27/2015  . Flu Shot  09/15/2017  . Pneumonia vaccines (1 of 2 - PCV13) 06/10/2018*  . Tetanus Vaccine  02/06/2023  .  Hepatitis C: One time screening is recommended by Center for Disease Control  (CDC) for  adults born from [birth year] through 1965.   Completed  *Topic was postponed. The date shown is not the original due date.  Advance Directive Advance directives are legal documents that let you make choices ahead of time about your health care and medical treatment in case you become unable to communicate for yourself. Advance directives are a way for you to communicate your wishes to family, friends, and health care providers. This can help convey your decisions about end-of-life care if you become unable to communicate. Discussing and writing advance directives should happen over time rather than all at once. Advance directives can be changed depending on your situation and what you want, even after you have signed the advance directives. If you do not have an advance directive, some states assign  family decision makers to act on your behalf based on how closely you are related to them. Each state has its own laws regarding advance directives. You may want to check with your health care provider, attorney, or state representative about the laws in your state. There are different types of advance directives, such as:  Medical power of attorney.  Living will.  Do not resuscitate (DNR) or do not attempt resuscitation (DNAR) order.  Health care proxy and medical power of attorney A health care proxy, also called a health care agent, is a person who is appointed to make medical decisions for you in cases in which you are unable to make the decisions yourself. Generally, people choose someone they know well and trust to represent their preferences. Make sure to ask this person for an agreement to act as your proxy. A proxy may have to exercise judgment in the event of a medical decision for which your wishes are not known. A medical power of attorney is a legal document that names your health care proxy. Depending on the laws in your state, after the document is written, it may also need to be:  Signed.  Notarized.  Dated.  Copied.  Witnessed.  Incorporated into your medical record.  You may also want to appoint someone to manage your financial affairs in a situation in which you are unable to do  so. This is called a durable power of attorney for finances. It is a separate legal document from the durable power of attorney for health care. You may choose the same person or someone different from your health care proxy to act as your agent in financial matters. If you do not appoint a proxy, or if there is a concern that the proxy is not acting in your best interests, a court-appointed guardian may be designated to act on your behalf. Living will A living will is a set of instructions documenting your wishes about medical care when you cannot express them yourself. Health care providers  should keep a copy of your living will in your medical record. You may want to give a copy to family members or friends. To alert caregivers in case of an emergency, you can place a card in your wallet to let them know that you have a living will and where they can find it. A living will is used if you become:  Terminally ill.  Incapacitated.  Unable to communicate or make decisions.  Items to consider in your living will include:  The use or non-use of life-sustaining equipment, such as dialysis machines and breathing machines (ventilators).  A DNR or DNAR order, which is the instruction not to use cardiopulmonary resuscitation (CPR) if breathing or heartbeat stops.  The use or non-use of tube feeding.  Withholding of food and fluids.  Comfort (palliative) care when the goal becomes comfort rather than a cure.  Organ and tissue donation.  A living will does not give instructions for distributing your money and property if you should pass away. It is recommended that you seek the advice of a lawyer when writing a will. Decisions about taxes, beneficiaries, and asset distribution will be legally binding. This process can relieve your family and friends of any concerns surrounding disputes or questions that may come up about the distribution of your assets. DNR or DNAR A DNR or DNAR order is a request not to have CPR in the event that your heart stops beating or you stop breathing. If a DNR or DNAR order has not been made and shared, a health care provider will try to help any patient whose heart has stopped or who has stopped breathing. If you plan to have surgery, talk with your health care provider about how your DNR or DNAR order will be followed if problems occur. Summary  Advance directives are the legal documents that allow you to make choices ahead of time about your health care and medical treatment in case you become unable to communicate for yourself.  The process of discussing  and writing advance directives should happen over time. You can change the advance directives, even after you have signed them.  Advance directives include DNR or DNAR orders, living wills, and designating an agent as your medical power of attorney. This information is not intended to replace advice given to you by your health care provider. Make sure you discuss any questions you have with your health care provider. Document Released: 05/11/2007 Document Revised: 12/22/2015 Document Reviewed: 12/22/2015 Elsevier Interactive Patient Education  2017 [employer].

## 2017-11-01 DIAGNOSIS — M85852 Other specified disorders of bone density and structure, left thigh: Secondary | ICD-10-CM | POA: Diagnosis not present

## 2017-11-02 ENCOUNTER — Ambulatory Visit (HOSPITAL_COMMUNITY): Admission: RE | Admit: 2017-11-02 | Payer: Medicare Other | Source: Ambulatory Visit

## 2017-11-07 ENCOUNTER — Ambulatory Visit (HOSPITAL_COMMUNITY)
Admission: RE | Admit: 2017-11-07 | Discharge: 2017-11-07 | Disposition: A | Discharge status: Home / Self Care | Payer: Medicare Other | Source: Ambulatory Visit | Attending: Pediatrics | Admitting: Pediatrics

## 2017-11-07 DIAGNOSIS — N1330 Unspecified hydronephrosis: Secondary | ICD-10-CM

## 2017-11-07 DIAGNOSIS — I700 Atherosclerosis of aorta: Secondary | ICD-10-CM | POA: Diagnosis not present

## 2017-11-07 DIAGNOSIS — N132 Hydronephrosis with renal and ureteral calculous obstruction: Secondary | ICD-10-CM | POA: Diagnosis not present

## 2017-11-08 ENCOUNTER — Other Ambulatory Visit: Payer: Self-pay | Admitting: Pediatrics

## 2017-11-08 DIAGNOSIS — N132 Hydronephrosis with renal and ureteral calculous obstruction: Secondary | ICD-10-CM

## 2017-11-09 ENCOUNTER — Ambulatory Visit (HOSPITAL_COMMUNITY)
Admission: RE | Admit: 2017-11-09 | Discharge: 2017-11-09 | Disposition: A | Discharge status: Home / Self Care | Payer: Medicare Other | Source: Ambulatory Visit | Attending: Pediatrics | Admitting: Pediatrics

## 2017-11-09 ENCOUNTER — Encounter (HOSPITAL_COMMUNITY): Payer: Self-pay

## 2017-11-09 DIAGNOSIS — E041 Nontoxic single thyroid nodule: Secondary | ICD-10-CM | POA: Insufficient documentation

## 2017-11-09 MED ORDER — LIDOCAINE HCL (PF) 2 % IJ SOLN
INTRAMUSCULAR | Status: AC
  Administered 2017-11-09: 5 mL
  Filled 2017-11-09: qty 10

## 2017-11-10 ENCOUNTER — Ambulatory Visit (HOSPITAL_COMMUNITY): Admission: RE | Admit: 2017-11-10 | Payer: Medicare Other | Source: Ambulatory Visit

## 2017-11-22 DIAGNOSIS — N132 Hydronephrosis with renal and ureteral calculous obstruction: Secondary | ICD-10-CM | POA: Diagnosis not present

## 2017-11-22 DIAGNOSIS — N201 Calculus of ureter: Secondary | ICD-10-CM | POA: Diagnosis not present

## 2017-11-24 ENCOUNTER — Other Ambulatory Visit: Payer: Self-pay | Admitting: Urology

## 2017-11-29 ENCOUNTER — Encounter: Payer: Self-pay | Admitting: *Deleted

## 2017-11-30 ENCOUNTER — Encounter (HOSPITAL_COMMUNITY): Payer: Self-pay | Admitting: General Practice

## 2017-12-05 ENCOUNTER — Encounter (HOSPITAL_COMMUNITY)
Admission: RE | Disposition: A | Discharge status: Home / Self Care | Payer: Self-pay | Source: Ambulatory Visit | Attending: Urology

## 2017-12-05 ENCOUNTER — Ambulatory Visit (HOSPITAL_COMMUNITY): Payer: Medicare Other

## 2017-12-05 ENCOUNTER — Ambulatory Visit (HOSPITAL_COMMUNITY)
Admission: RE | Admit: 2017-12-05 | Discharge: 2017-12-05 | Disposition: A | Discharge status: Home / Self Care | Payer: Medicare Other | Source: Ambulatory Visit | Attending: Urology | Admitting: Urology

## 2017-12-05 ENCOUNTER — Other Ambulatory Visit: Payer: Self-pay

## 2017-12-05 ENCOUNTER — Encounter (HOSPITAL_COMMUNITY): Payer: Self-pay | Admitting: *Deleted

## 2017-12-05 DIAGNOSIS — Z7951 Long term (current) use of inhaled steroids: Secondary | ICD-10-CM | POA: Insufficient documentation

## 2017-12-05 DIAGNOSIS — Z79899 Other long term (current) drug therapy: Secondary | ICD-10-CM | POA: Insufficient documentation

## 2017-12-05 DIAGNOSIS — E669 Obesity, unspecified: Secondary | ICD-10-CM | POA: Diagnosis not present

## 2017-12-05 DIAGNOSIS — G4730 Sleep apnea, unspecified: Secondary | ICD-10-CM | POA: Insufficient documentation

## 2017-12-05 DIAGNOSIS — J45909 Unspecified asthma, uncomplicated: Secondary | ICD-10-CM | POA: Diagnosis not present

## 2017-12-05 DIAGNOSIS — Z6838 Body mass index (BMI) 38.0-38.9, adult: Secondary | ICD-10-CM | POA: Insufficient documentation

## 2017-12-05 DIAGNOSIS — Z96652 Presence of left artificial knee joint: Secondary | ICD-10-CM | POA: Insufficient documentation

## 2017-12-05 DIAGNOSIS — G629 Polyneuropathy, unspecified: Secondary | ICD-10-CM | POA: Diagnosis not present

## 2017-12-05 DIAGNOSIS — I10 Essential (primary) hypertension: Secondary | ICD-10-CM | POA: Insufficient documentation

## 2017-12-05 DIAGNOSIS — R609 Edema, unspecified: Secondary | ICD-10-CM

## 2017-12-05 DIAGNOSIS — Z87891 Personal history of nicotine dependence: Secondary | ICD-10-CM | POA: Insufficient documentation

## 2017-12-05 DIAGNOSIS — N201 Calculus of ureter: Secondary | ICD-10-CM

## 2017-12-05 DIAGNOSIS — E785 Hyperlipidemia, unspecified: Secondary | ICD-10-CM | POA: Diagnosis not present

## 2017-12-05 DIAGNOSIS — N202 Calculus of kidney with calculus of ureter: Secondary | ICD-10-CM | POA: Diagnosis not present

## 2017-12-05 DIAGNOSIS — K219 Gastro-esophageal reflux disease without esophagitis: Secondary | ICD-10-CM | POA: Insufficient documentation

## 2017-12-05 HISTORY — PX: EXTRACORPOREAL SHOCK WAVE LITHOTRIPSY: SHX1557

## 2017-12-05 HISTORY — DX: Cardiac murmur, unspecified: R01.1

## 2017-12-05 HISTORY — DX: Personal history of urinary calculi: Z87.442

## 2017-12-05 SURGERY — LITHOTRIPSY, ESWL
Anesthesia: LOCAL | Laterality: Left

## 2017-12-05 MED ORDER — DIAZEPAM 5 MG PO TABS
10.0000 mg | ORAL_TABLET | ORAL | Status: AC
  Administered 2017-12-05: 10 mg via ORAL
  Filled 2017-12-05 (×2): qty 2

## 2017-12-05 MED ORDER — OXYCODONE HCL 5 MG PO TABS: 5.0000 mg | ORAL_TABLET | ORAL | 0 refills | Status: DC | PRN

## 2017-12-05 MED ORDER — SODIUM CHLORIDE 0.9 % IV SOLN
INTRAVENOUS | Status: DC
  Administered 2017-12-05: 07:00:00 via INTRAVENOUS

## 2017-12-05 MED ORDER — CIPROFLOXACIN HCL 500 MG PO TABS
500.0000 mg | ORAL_TABLET | ORAL | Status: AC
  Administered 2017-12-05: 500 mg via ORAL
  Filled 2017-12-05: qty 1

## 2017-12-05 MED ORDER — DIPHENHYDRAMINE HCL 25 MG PO CAPS
25.0000 mg | ORAL_CAPSULE | ORAL | Status: AC
  Administered 2017-12-05: 25 mg via ORAL
  Filled 2017-12-05: qty 1

## 2017-12-05 SURGICAL SUPPLY — 3 items
COVER SURGICAL LIGHT HANDLE (MISCELLANEOUS) ×2 IMPLANT
COVER WAND RF STERILE (DRAPES) IMPLANT
TOWEL OR 17X26 10 PK STRL BLUE (TOWEL DISPOSABLE) ×2 IMPLANT

## 2017-12-05 NOTE — Op Note (Signed)
See [facility] OP note scanned into chart. 

## 2017-12-05 NOTE — Discharge Instructions (Signed)
See [facility] discharge instructions in chart.  

## 2017-12-05 NOTE — Op Note (Signed)
ESL treatment shortened due to difficulty maintaining uper airway patency (obvious OSA) as well as HTN

## 2017-12-05 NOTE — H&P (Signed)
H&P  Chief Complaint: Left kidney stone  History of Present Illness: Joyce Ross is a 67 y.o. year old female presenting for ESL of a 21 mm left ureteral stone. This is a large stone and part of a staged process.  Past Medical History:  Diagnosis Date  . Asthma   . Bell's palsy   . Bladder infection    FREQUENT  . Dyspnea    exertion  . GERD (gastroesophageal reflux disease)   . Heart murmur    found a couple of years ago no problems  . History of kidney stones   . Hyperlipidemia   . Hypertension   . Neuropathy    FEET AND LEGS (NON DIABETIC)    Past Surgical History:  Procedure Laterality Date  . BILATERAL CARPAL TUNNEL RELEASE    . CESAREAN SECTION     x 2  . HIP SURGERY Left   . JOINT REPLACEMENT     left knee  . left knee surgery    . LITHOTRIPSY    . MOUTH SURGERY     all teeth pulled  . SPINE SURGERY     cervical    Home Medications:  Medications Prior to Admission  Medication Sig Dispense Refill  . acetaminophen (TYLENOL) 325 MG tablet Take 325 mg by mouth every 6 (six) hours as needed.    . budesonide-formoterol (SYMBICORT) 80-4.5 MCG/ACT inhaler Inhale 2 puffs into the lungs 2 (two) times daily. (Patient taking differently: Inhale 2 puffs into the lungs as needed. ) 1 Inhaler 12  . cetirizine (ZYRTEC) 10 MG tablet Take 1 tablet (10 mg total) by mouth daily. 30 tablet 11  . citalopram (CELEXA) 40 MG tablet Take 1 tablet (40 mg total) by mouth daily. 90 tablet 1  . cyclobenzaprine (FLEXERIL) 10 MG tablet Take 1 tablet (10 mg total) by mouth 3 (three) times daily as needed. for muscle spams 60 tablet 2  . ibuprofen (ADVIL,MOTRIN) 200 MG tablet Take 200 mg by mouth every 6 (six) hours as needed.    . Melatonin 1 MG TABS Take 0.5 tablets by mouth at bedtime as needed.    . pravastatin (PRAVACHOL) 80 MG tablet Take 1 tablet (80 mg total) by mouth at bedtime. 90 tablet 3  . ranitidine (ZANTAC) 150 MG tablet Take 1 tablet (150 mg total) by mouth 2 (two) times  daily. 180 tablet 1  . sodium chloride (OCEAN) 0.65 % SOLN nasal spray Place 2 sprays into both nostrils as needed for congestion.    Marland Kitchen buPROPion (WELLBUTRIN) 75 MG tablet Take 1 tablet (75 mg total) by mouth 2 (two) times daily. (Patient taking differently: Take 75 mg by mouth daily. ) 60 tablet 2  . fluticasone (FLONASE) 50 MCG/ACT nasal spray Place 2 sprays into both nostrils daily. 16 g 6  . furosemide (LASIX) 20 MG tablet Take 1 tablet (20 mg total) by mouth daily as needed for fluid. 30 tablet 1  . gabapentin (NEURONTIN) 300 MG capsule TAKE ONE CAPSULE BY MOUTH three TIMES A DAY (Patient taking differently: 300 mg 3 (three) times daily as needed. TAKE ONE CAPSULE BY MOUTH three TIMES A DAY) 90 capsule 4  . metoprolol tartrate (LOPRESSOR) 25 MG tablet Take 0.5 tablets (12.5 mg total) by mouth 2 (two) times daily. (Patient not taking: Reported on 10/25/2017) 90 tablet 3    Allergies:  Allergies  Allergen Reactions  . Penicillins Other (See Comments)    thrush  . Radish [Raphanus Sativus]  hives  . Yellow Jacket Venom     Rash / itches    Family History  Problem Relation Age of Onset  . Heart disease Mother   . Heart disease Father        heart attack  . Heart disease Sister   . Diabetes Brother   . Diabetes Brother        sepsis  . Heart disease Brother     Social History:  reports that she quit smoking about 5 years ago. Her smoking use included cigarettes. She has never used smokeless tobacco. She reports that she does not drink alcohol or use drugs.  ROS: A complete review of systems was performed.  All systems are negative except for pertinent findings as noted.  Physical Exam:  Vital signs in last 24 hours: Temp:  [98.8 F (37.1 C)] 98.8 F (37.1 C) (10/21 0636) Pulse Rate:  [80] 80 (10/21 0636) Resp:  [18] 18 (10/21 0636) BP: (181)/(83) 181/83 (10/21 0636) SpO2:  [96 %] 96 % (10/21 0636) Weight:  [83.5 kg] 83.5 kg (10/21 0636) General:  Alert and oriented,  No acute distress HEENT: Normocephalic, atraumatic Neck: No JVD or lymphadenopathy Cardiovascular: Regular rate and rhythm Lungs: Clear bilaterally Abdomen: Soft, nontender, nondistended, no abdominal masses Back: No CVA tenderness Extremities: No edema Neurologic: Grossly intact  Laboratory Data:  No results found for this or any previous visit (from the past 24 hour(s)). No results found for this or any previous visit (from the past 240 hour(s)). Creatinine: No results for input(s): CREATININE in the last 168 hours.  Radiologic Imaging: Dg Abd 1 View  Result Date: 12/05/2017 CLINICAL DATA:  Left ureter stone.  Preop evaluation. EXAM: ABDOMEN - 1 VIEW COMPARISON:  Abdominal CT 11/07/2017 FINDINGS: Again noted is a large stone in the proximal left ureter region. Stone has not moved in location. This stone measures 2.2 x 1.4 cm. There are at least 3 stones in the left renal collecting system, largest measuring 1.1 cm near the left renal pelvis. Patient has a known small right kidney stone from the prior CT but this is not clearly identified on this examination. Degenerative changes in the lumbar spine with mild curvature. Wedge-shaped defect involving the left iliac wing appears chronic. Nonobstructive bowel gas pattern. IMPRESSION: Stable position of the large stone in the proximal left ureter measuring up to 2.2 cm. Additional left renal calculi, largest measuring 1.1 cm. Electronically Signed   By: Markus Daft M.D.   On: 12/05/2017 08:08    Impression/Assessment:  Large left ureteral stone.  Plan:  Initial ESL treatment of a large left ureteral stone--this will be a staged process.  [name] 12/05/2017, 8:21 AM  [name]. [name] MD

## 2017-12-06 ENCOUNTER — Encounter (HOSPITAL_COMMUNITY): Payer: Self-pay | Admitting: Urology

## 2017-12-09 ENCOUNTER — Ambulatory Visit: Payer: Medicare Other | Admitting: Pediatrics

## 2017-12-13 DIAGNOSIS — Z1231 Encounter for screening mammogram for malignant neoplasm of breast: Secondary | ICD-10-CM | POA: Diagnosis not present

## 2017-12-14 ENCOUNTER — Ambulatory Visit (INDEPENDENT_AMBULATORY_CARE_PROVIDER_SITE_OTHER): Payer: Medicare Other | Admitting: Pediatrics

## 2017-12-14 ENCOUNTER — Encounter: Payer: Self-pay | Admitting: Pediatrics

## 2017-12-14 VITALS — BP 142/72 | HR 87 | Temp 99.8°F | Ht <= 58 in | Wt 183.4 lb

## 2017-12-14 DIAGNOSIS — I2510 Atherosclerotic heart disease of native coronary artery without angina pectoris: Secondary | ICD-10-CM

## 2017-12-14 DIAGNOSIS — F339 Major depressive disorder, recurrent, unspecified: Secondary | ICD-10-CM | POA: Diagnosis not present

## 2017-12-14 DIAGNOSIS — E041 Nontoxic single thyroid nodule: Secondary | ICD-10-CM

## 2017-12-14 DIAGNOSIS — G629 Polyneuropathy, unspecified: Secondary | ICD-10-CM

## 2017-12-14 DIAGNOSIS — K219 Gastro-esophageal reflux disease without esophagitis: Secondary | ICD-10-CM | POA: Diagnosis not present

## 2017-12-14 DIAGNOSIS — I712 Thoracic aortic aneurysm, without rupture, unspecified: Secondary | ICD-10-CM

## 2017-12-14 DIAGNOSIS — M545 Low back pain, unspecified: Secondary | ICD-10-CM

## 2017-12-14 MED ORDER — CYCLOBENZAPRINE HCL 10 MG PO TABS: 10.0000 mg | ORAL_TABLET | Freq: Three times a day (TID) | ORAL | 2 refills | Status: DC | PRN

## 2017-12-14 MED ORDER — GABAPENTIN 300 MG PO CAPS: ORAL_CAPSULE | ORAL | 4 refills | Status: DC

## 2017-12-14 MED ORDER — RANITIDINE HCL 150 MG PO TABS: 150.0000 mg | ORAL_TABLET | Freq: Two times a day (BID) | ORAL | 1 refills | Status: DC

## 2017-12-14 MED ORDER — CITALOPRAM HYDROBROMIDE 40 MG PO TABS: 40.0000 mg | ORAL_TABLET | Freq: Every day | ORAL | 1 refills | Status: DC

## 2017-12-14 NOTE — Progress Notes (Signed)
Gabapentin called into [facility]. Gabapentin is a controlled medication in the state of [place] which will not go electronically

## 2017-12-14 NOTE — Progress Notes (Signed)
Subjective:   [name], female    DOB: [birth year], 67 y.o.   MRN: [id number] CC: Medical Management of Chronic Issues  HPI: Joyce Ross is a 67 y.o. 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  Has been feeling well overall.  No fevers, normal appetite.  She was seen by urology, one procedure attempted to get rid of the L ureteral stone that is obstructing her left kidney.  She thinks she may have passed a few small stones, she is not sure.  She remains asymptomatic from the stone.  She had a thyroid biopsy, biopsy was benign.  Will need thyroid ultrasound in 1 year.  Back pain has been bothering her more regularly recently.  She is avoiding lifting anything heavy.  Flexeril does help some with the pain.  Gabapentin also helps.  Mood has been fine.  GERD: Taking ranitidine regularly, helps with symptoms.  Ascending thoracic aortic aneurysm: Found on CT scan 09/2017, 4.3 cm.  Recommend annual imaging.  Relevant past medical, surgical, family and social history reviewed. Allergies and medications reviewed and updated. Social History   Tobacco Use  Smoking Status Former Smoker  . Types: Cigarettes  . Last attempt to quit: 10/09/2012  . Years since quitting: 5.1  Smokeless Tobacco Never Used   ROS: Per HPI   Objective:    BP (!) 142/72   Pulse 87   Temp 99.8 F (37.7 C) (Oral)   Ht 4\' 10"  (1.473 m)   Wt 183 lb 6.4 oz (83.2 kg)   BMI 38.33 kg/m   Wt Readings from Last 3 Encounters:  12/14/17 183 lb 6.4 oz (83.2 kg)  12/05/17 184 lb 2 oz (83.5 kg)  10/25/17 184 lb 6.4 oz (83.6 kg)    Gen: NAD, alert, cooperative with exam, NCAT EYES: EOMI, no conjunctival injection, or no icterus ENT: OP without erythema LYMPH: no cervical LAD CV: NRRR, normal S1/S2, no murmur, distal pulses 2+ b/l Resp: CTABL, no wheezes, normal WOB Abd: +BS, soft, NTND. Ext: No edema, warm Neuro: Alert and oriented, strength equal b/l UE and LE, coordination  grossly normal MSK: normal muscle bulk  Assessment & Plan:  Deaira was seen today for medical management of chronic issues.  Diagnoses and all orders for this visit:  Low back pain without sciatica, unspecified back pain laterality, unspecified chronicity Symptoms stable, does bother her regularly.  Discussed gentle back exercises, referral to PT versus orthopedics in the future.  Patient declines referral for now.  Taking Flexeril and gabapentin does help some. -     cyclobenzaprine (FLEXERIL) 10 MG tablet; Take 1 tablet (10 mg total) by mouth 3 (three) times daily as needed. for muscle spams  Gastroesophageal reflux disease, esophagitis presence not specified Stable, continue below -     ranitidine (ZANTAC) 150 MG tablet; Take 1 tablet (150 mg total) by mouth 2 (two) times daily.  Depression, recurrent ([place]) Stable, continue below -     citalopram (CELEXA) 40 MG tablet; Take 1 tablet (40 mg total) by mouth daily.  Neuropathy Stable, continue below -     gabapentin (NEURONTIN) 300 MG capsule; TAKE ONE CAPSULE BY MOUTH three TIMES A DAY  Thyroid nodule Biopsy [facility], will need repeat imaging in 1 year, 10/2018  Thoracic aortic aneurysm without rupture ([place]) Found on CT scan, 4.3 cm.  Will need repeat imaging in 1 year, 09/2018.  Follow up plan: Return in about 3 months (around 03/16/2018). Assunta Found, MD  [place]

## 2017-12-19 DIAGNOSIS — N201 Calculus of ureter: Secondary | ICD-10-CM | POA: Diagnosis not present

## 2017-12-23 DIAGNOSIS — M25561 Pain in right knee: Secondary | ICD-10-CM | POA: Diagnosis not present

## 2017-12-23 DIAGNOSIS — Z79899 Other long term (current) drug therapy: Secondary | ICD-10-CM | POA: Diagnosis not present

## 2017-12-23 DIAGNOSIS — Z87891 Personal history of nicotine dependence: Secondary | ICD-10-CM | POA: Diagnosis not present

## 2017-12-23 DIAGNOSIS — W010XXA Fall on same level from slipping, tripping and stumbling without subsequent striking against object, initial encounter: Secondary | ICD-10-CM | POA: Diagnosis not present

## 2017-12-23 DIAGNOSIS — M2391 Unspecified internal derangement of right knee: Secondary | ICD-10-CM | POA: Diagnosis not present

## 2017-12-26 ENCOUNTER — Other Ambulatory Visit: Payer: Self-pay | Admitting: Urology

## 2018-01-03 ENCOUNTER — Other Ambulatory Visit: Payer: Self-pay | Admitting: Pediatrics

## 2018-01-03 DIAGNOSIS — E785 Hyperlipidemia, unspecified: Secondary | ICD-10-CM

## 2018-01-04 NOTE — Patient Instructions (Addendum)
Joyce Ross  07-21-1950    Your procedure is scheduled on:  01-11-2018   Report to [facility] Main  Entrance, Report to admitting at  5:30 AM    Call this number if you have problems the morning of surgery [phone]        Remember: Do not eat food or drink liquids :After Midnight.                                     BRUSH YOUR TEETH MORNING OF SURGERY AND RINSE YOUR MOUTH OUT, NO CHEWING GUM CANDY OR MINTS.        Take these medicines the morning of surgery with A SIP OF WATER:  Fluticasone nasal spray,   Ranitidine(zantac), Symbicort inhaler, Proventil as needed and                                                                                                                                bring with you day of surgery, Oxycodone if needed                                     You may not have any metal on your body including hair pins and piercings              Do not wear jewelry, make-up, lotions, powders or perfumes, deodorant              Do not wear nail polish.  Do not shave  48 hours prior to surgery.                    Do not bring valuables to the hospital. [place].  Contacts, dentures or bridgework may not be worn into surgery.  Leave suitcase in the car. After surgery it may be brought to your room.     Patients discharged the day of surgery will not be allowed to drive home.  Name and phone number of your driver:  Son--  Joyce Ross #[phone]          _____________________________________________________________________             [facility] - Preparing for Surgery Before surgery, you can play an important role.  Because skin is not sterile, your skin needs to be as free of germs as possible.  You can reduce the number of germs on your skin by washing with CHG (chlorahexidine gluconate) soap before surgery.  CHG is an antiseptic cleaner which kills germs and  bonds with the skin to continue killing germs even after washing. Please DO  NOT use if you have an allergy to CHG or antibacterial soaps.  If your skin becomes reddened/irritated stop using the CHG and inform your nurse when you arrive at Short Stay. Do not shave (including legs and underarms) for at least 48 hours prior to the first CHG shower.  You may shave your face/neck. Please follow these instructions carefully:  1.  Shower with CHG Soap the night before surgery and the  morning of Surgery.  2.  If you choose to wash your hair, wash your hair first as usual with your  normal  shampoo.  3.  After you shampoo, rinse your hair and body thoroughly to remove the  shampoo.                            4.  Use CHG as you would any other liquid soap.  You can apply chg directly  to the skin and wash                       Gently with a scrungie or clean washcloth.  5.  Apply the CHG Soap to your body ONLY FROM THE NECK DOWN.   Do not use on face/ open                           Wound or open sores. Avoid contact with eyes, ears mouth and genitals (private parts).                       Wash face,  Genitals (private parts) with your normal soap.             6.  Wash thoroughly, paying special attention to the area where your surgery  will be performed.  7.  Thoroughly rinse your body with warm water from the neck down.  8.  DO NOT shower/wash with your normal soap after using and rinsing off  the CHG Soap.             9.  Pat yourself dry with a clean towel.            10.  Wear clean pajamas.            11.  Place clean sheets on your bed the night of your first shower and do not  sleep with pets. Day of Surgery : Do not apply any lotions/deodorants the morning of surgery.  Please wear clean clothes to the hospital/surgery center.  FAILURE TO FOLLOW THESE INSTRUCTIONS MAY RESULT IN THE CANCELLATION OF YOUR SURGERY PATIENT SIGNATURE_________________________________  NURSE  SIGNATURE__________________________________  ________________________________________________________________________

## 2018-01-05 ENCOUNTER — Encounter (HOSPITAL_COMMUNITY): Payer: Self-pay

## 2018-01-05 ENCOUNTER — Inpatient Hospital Stay (HOSPITAL_COMMUNITY): Admission: RE | Admit: 2018-01-05 | Payer: Medicare Other | Source: Ambulatory Visit

## 2018-01-05 ENCOUNTER — Encounter (HOSPITAL_COMMUNITY)
Admission: RE | Admit: 2018-01-05 | Discharge: 2018-01-05 | Disposition: A | Discharge status: Home / Self Care | Payer: Medicare Other | Source: Ambulatory Visit | Attending: Urology | Admitting: Urology

## 2018-01-05 ENCOUNTER — Other Ambulatory Visit: Payer: Self-pay

## 2018-01-05 ENCOUNTER — Encounter (INDEPENDENT_AMBULATORY_CARE_PROVIDER_SITE_OTHER): Payer: Self-pay

## 2018-01-05 DIAGNOSIS — Z01812 Encounter for preprocedural laboratory examination: Secondary | ICD-10-CM | POA: Insufficient documentation

## 2018-01-05 HISTORY — DX: Nontoxic single thyroid nodule: E04.1

## 2018-01-05 HISTORY — DX: Anesthesia of skin: R20.0

## 2018-01-05 HISTORY — DX: Complete loss of teeth, unspecified cause, unspecified class: K08.109

## 2018-01-05 HISTORY — DX: Atherosclerotic heart disease of native coronary artery without angina pectoris: I25.10

## 2018-01-05 HISTORY — DX: Bell's palsy: G51.0

## 2018-01-05 HISTORY — DX: Paresthesia of skin: R20.2

## 2018-01-05 HISTORY — DX: Mild intermittent asthma, uncomplicated: J45.20

## 2018-01-05 HISTORY — DX: Nausea with vomiting, unspecified: R11.2

## 2018-01-05 HISTORY — DX: Polyneuropathy, unspecified: G62.9

## 2018-01-05 HISTORY — DX: Thoracic aortic aneurysm, without rupture: I71.2

## 2018-01-05 HISTORY — DX: Personal history of urinary (tract) infections: Z87.440

## 2018-01-05 HISTORY — DX: Other specified postprocedural states: Z98.890

## 2018-01-05 HISTORY — DX: Aneurysm of the ascending aorta, without rupture: I71.21

## 2018-01-05 HISTORY — DX: Elevated blood-pressure reading, without diagnosis of hypertension: R03.0

## 2018-01-05 HISTORY — DX: Unspecified osteoarthritis, unspecified site: M19.90

## 2018-01-05 HISTORY — DX: Complete loss of teeth, unspecified cause, unspecified class: Z97.2

## 2018-01-05 LAB — BASIC METABOLIC PANEL
Anion gap: 8 (ref 5–15)
BUN: 30 mg/dL — ABNORMAL HIGH (ref 8–23)
CO2: 32 mmol/L (ref 22–32)
Calcium: 9.3 mg/dL (ref 8.9–10.3)
Chloride: 105 mmol/L (ref 98–111)
Creatinine, Ser: 1.13 mg/dL — ABNORMAL HIGH (ref 0.44–1.00)
GFR calc Af Amer: 57 mL/min — ABNORMAL LOW (ref >60)
GFR calc non Af Amer: 49 mL/min — ABNORMAL LOW (ref >60)
Glucose, Bld: 78 mg/dL (ref 70–99)
Potassium: 5.2 mmol/L — ABNORMAL HIGH (ref 3.5–5.1)
Sodium: 145 mmol/L (ref 135–145)

## 2018-01-05 LAB — CBC
HCT: 43 % (ref 36.0–46.0)
Hemoglobin: 13.2 g/dL (ref 12.0–15.0)
MCH: 30.2 pg (ref 26.0–34.0)
MCHC: 30.7 g/dL (ref 30.0–36.0)
MCV: 98.4 fL (ref 80.0–100.0)
Platelets: 346 10*3/uL (ref 150–400)
RBC: 4.37 MIL/uL (ref 3.87–5.11)
RDW: 13.2 % (ref 11.5–15.5)
WBC: 14.4 10*3/uL — ABNORMAL HIGH (ref 4.0–10.5)
nRBC: 0 % (ref 0.0–0.2)

## 2018-01-05 NOTE — Progress Notes (Addendum)
BMP and CBC results dated 01-05-2018 routed to dr bell in epic.  Chest CT 09-28-2017 in epic.

## 2018-01-10 NOTE — Anesthesia Preprocedure Evaluation (Addendum)
Anesthesia Evaluation  Patient identified by MRN, date of birth, ID band Patient awake    Reviewed: Allergy & Precautions, NPO status , Patient's Chart, lab work & pertinent test results, reviewed documented beta blocker date and time   History of Anesthesia Complications (+) PONV and history of anesthetic complications  Airway Mallampati: III   Neck ROM: Full  Mouth opening: Limited Mouth Opening  Dental  (+) Edentulous Upper, Edentulous Lower   Pulmonary asthma , former smoker,    breath sounds clear to auscultation       Cardiovascular hypertension, Pt. on home beta blockers and Pt. on medications + CAD   Rhythm:Regular Rate:Normal  thpracic ascending aortic aneurysm 4.3cm   Neuro/Psych negative neurological ROS  negative psych ROS   GI/Hepatic Neg liver ROS, GERD  Controlled and Medicated,  Endo/Other  negative endocrine ROS  Renal/GU negative Renal ROS  negative genitourinary   Musculoskeletal  (+) Arthritis , Osteoarthritis,    Abdominal   Peds negative pediatric ROS (+)  Hematology negative hematology ROS (+)   Anesthesia Other Findings Left ureteral stone  Reproductive/Obstetrics negative OB ROS                            Anesthesia Physical Anesthesia Plan  ASA: III  Anesthesia Plan: General   Post-op Pain Management:    Induction: Intravenous  PONV Risk Score and Plan: 4 or greater and Midazolam, Dexamethasone, Ondansetron, Scopolamine patch - Pre-op and TIVA  Airway Management Planned: LMA  Additional Equipment:   Intra-op Plan:   Post-operative Plan: Extubation in OR  Informed Consent: I have reviewed the patients History and Physical, chart, labs and discussed the procedure including the risks, benefits and alternatives for the proposed anesthesia with the patient or authorized representative who has indicated his/her understanding and acceptance.   Dental  advisory given  Plan Discussed with: CRNA  Anesthesia Plan Comments:        Anesthesia Quick Evaluation

## 2018-01-11 ENCOUNTER — Encounter (HOSPITAL_COMMUNITY)
Admission: RE | Disposition: A | Discharge status: Home / Self Care | Payer: Self-pay | Source: Ambulatory Visit | Attending: Urology

## 2018-01-11 ENCOUNTER — Ambulatory Visit (HOSPITAL_COMMUNITY): Payer: Medicare Other | Admitting: Anesthesiology

## 2018-01-11 ENCOUNTER — Ambulatory Visit (HOSPITAL_COMMUNITY)
Admission: RE | Admit: 2018-01-11 | Discharge: 2018-01-11 | Disposition: A | Discharge status: Home / Self Care | Payer: Medicare Other | Source: Ambulatory Visit | Attending: Urology | Admitting: Urology

## 2018-01-11 ENCOUNTER — Ambulatory Visit (HOSPITAL_COMMUNITY): Payer: Medicare Other

## 2018-01-11 ENCOUNTER — Encounter (HOSPITAL_COMMUNITY): Payer: Self-pay | Admitting: Emergency Medicine

## 2018-01-11 DIAGNOSIS — Z880 Allergy status to penicillin: Secondary | ICD-10-CM | POA: Diagnosis not present

## 2018-01-11 DIAGNOSIS — G629 Polyneuropathy, unspecified: Secondary | ICD-10-CM | POA: Diagnosis not present

## 2018-01-11 DIAGNOSIS — E785 Hyperlipidemia, unspecified: Secondary | ICD-10-CM | POA: Diagnosis not present

## 2018-01-11 DIAGNOSIS — I714 Abdominal aortic aneurysm, without rupture: Secondary | ICD-10-CM | POA: Diagnosis not present

## 2018-01-11 DIAGNOSIS — Z79899 Other long term (current) drug therapy: Secondary | ICD-10-CM | POA: Diagnosis not present

## 2018-01-11 DIAGNOSIS — G510 Bell's palsy: Secondary | ICD-10-CM | POA: Diagnosis not present

## 2018-01-11 DIAGNOSIS — Z91018 Allergy to other foods: Secondary | ICD-10-CM | POA: Diagnosis not present

## 2018-01-11 DIAGNOSIS — Z87440 Personal history of urinary (tract) infections: Secondary | ICD-10-CM | POA: Insufficient documentation

## 2018-01-11 DIAGNOSIS — Z7951 Long term (current) use of inhaled steroids: Secondary | ICD-10-CM | POA: Insufficient documentation

## 2018-01-11 DIAGNOSIS — I2510 Atherosclerotic heart disease of native coronary artery without angina pectoris: Secondary | ICD-10-CM | POA: Diagnosis not present

## 2018-01-11 DIAGNOSIS — Z8249 Family history of ischemic heart disease and other diseases of the circulatory system: Secondary | ICD-10-CM | POA: Insufficient documentation

## 2018-01-11 DIAGNOSIS — N201 Calculus of ureter: Secondary | ICD-10-CM | POA: Diagnosis not present

## 2018-01-11 DIAGNOSIS — Z91048 Other nonmedicinal substance allergy status: Secondary | ICD-10-CM | POA: Insufficient documentation

## 2018-01-11 DIAGNOSIS — K219 Gastro-esophageal reflux disease without esophagitis: Secondary | ICD-10-CM | POA: Insufficient documentation

## 2018-01-11 DIAGNOSIS — Z87891 Personal history of nicotine dependence: Secondary | ICD-10-CM | POA: Insufficient documentation

## 2018-01-11 DIAGNOSIS — R011 Cardiac murmur, unspecified: Secondary | ICD-10-CM | POA: Insufficient documentation

## 2018-01-11 DIAGNOSIS — M1990 Unspecified osteoarthritis, unspecified site: Secondary | ICD-10-CM | POA: Insufficient documentation

## 2018-01-11 DIAGNOSIS — N132 Hydronephrosis with renal and ureteral calculous obstruction: Secondary | ICD-10-CM | POA: Diagnosis not present

## 2018-01-11 DIAGNOSIS — I10 Essential (primary) hypertension: Secondary | ICD-10-CM | POA: Insufficient documentation

## 2018-01-11 DIAGNOSIS — J4520 Mild intermittent asthma, uncomplicated: Secondary | ICD-10-CM | POA: Insufficient documentation

## 2018-01-11 DIAGNOSIS — Z981 Arthrodesis status: Secondary | ICD-10-CM | POA: Diagnosis not present

## 2018-01-11 DIAGNOSIS — Z87442 Personal history of urinary calculi: Secondary | ICD-10-CM | POA: Insufficient documentation

## 2018-01-11 DIAGNOSIS — N202 Calculus of kidney with calculus of ureter: Secondary | ICD-10-CM | POA: Diagnosis not present

## 2018-01-11 DIAGNOSIS — Z888 Allergy status to other drugs, medicaments and biological substances status: Secondary | ICD-10-CM | POA: Diagnosis not present

## 2018-01-11 DIAGNOSIS — Z91038 Other insect allergy status: Secondary | ICD-10-CM | POA: Insufficient documentation

## 2018-01-11 HISTORY — PX: CYSTOSCOPY WITH RETROGRADE PYELOGRAM, URETEROSCOPY AND STENT PLACEMENT: SHX5789

## 2018-01-11 HISTORY — PX: HOLMIUM LASER APPLICATION: SHX5852

## 2018-01-11 SURGERY — CYSTOURETEROSCOPY, WITH RETROGRADE PYELOGRAM AND STENT INSERTION
Anesthesia: General | Laterality: Left

## 2018-01-11 MED ORDER — FENTANYL CITRATE (PF) 100 MCG/2ML IJ SOLN: 25.0000 ug | INTRAMUSCULAR | Status: DC | PRN

## 2018-01-11 MED ORDER — PROPOFOL 10 MG/ML IV BOLUS
INTRAVENOUS | Status: AC
  Filled 2018-01-11: qty 20

## 2018-01-11 MED ORDER — PROPOFOL 10 MG/ML IV BOLUS
INTRAVENOUS | Status: DC | PRN
  Administered 2018-01-11: 150 mg via INTRAVENOUS
  Administered 2018-01-11: 20 mg via INTRAVENOUS

## 2018-01-11 MED ORDER — OXYCODONE HCL 5 MG PO TABS: 5.0000 mg | ORAL_TABLET | ORAL | 0 refills | Status: DC | PRN

## 2018-01-11 MED ORDER — MIDAZOLAM HCL 2 MG/2ML IJ SOLN
INTRAMUSCULAR | Status: AC
  Filled 2018-01-11: qty 2

## 2018-01-11 MED ORDER — ONDANSETRON HCL 4 MG/2ML IJ SOLN
INTRAMUSCULAR | Status: DC | PRN
  Administered 2018-01-11: 4 mg via INTRAVENOUS

## 2018-01-11 MED ORDER — MIDAZOLAM HCL 5 MG/5ML IJ SOLN
INTRAMUSCULAR | Status: DC | PRN
  Administered 2018-01-11: 2 mg via INTRAVENOUS

## 2018-01-11 MED ORDER — LIDOCAINE 2% (20 MG/ML) 5 ML SYRINGE
INTRAMUSCULAR | Status: DC | PRN
  Administered 2018-01-11: 100 mg via INTRAVENOUS

## 2018-01-11 MED ORDER — DEXAMETHASONE SODIUM PHOSPHATE 10 MG/ML IJ SOLN
INTRAMUSCULAR | Status: DC | PRN
  Administered 2018-01-11: 5 mg via INTRAVENOUS

## 2018-01-11 MED ORDER — IOHEXOL 300 MG/ML  SOLN
INTRAMUSCULAR | Status: DC | PRN
  Administered 2018-01-11: 10 mL via URETHRAL

## 2018-01-11 MED ORDER — FENTANYL CITRATE (PF) 100 MCG/2ML IJ SOLN
INTRAMUSCULAR | Status: AC
  Filled 2018-01-11: qty 2

## 2018-01-11 MED ORDER — ONDANSETRON HCL 4 MG/2ML IJ SOLN
INTRAMUSCULAR | Status: AC
  Filled 2018-01-11: qty 2

## 2018-01-11 MED ORDER — CIPROFLOXACIN IN D5W 400 MG/200ML IV SOLN
400.0000 mg | INTRAVENOUS | Status: AC
  Administered 2018-01-11: 400 mg via INTRAVENOUS
  Filled 2018-01-11: qty 200

## 2018-01-11 MED ORDER — PHENYLEPHRINE 40 MCG/ML (10ML) SYRINGE FOR IV PUSH (FOR BLOOD PRESSURE SUPPORT)
PREFILLED_SYRINGE | INTRAVENOUS | Status: DC | PRN
  Administered 2018-01-11: 120 ug via INTRAVENOUS
  Administered 2018-01-11: 80 ug via INTRAVENOUS
  Administered 2018-01-11: 120 ug via INTRAVENOUS

## 2018-01-11 MED ORDER — LIDOCAINE 2% (20 MG/ML) 5 ML SYRINGE
INTRAMUSCULAR | Status: AC
  Filled 2018-01-11: qty 5

## 2018-01-11 MED ORDER — LACTATED RINGERS IV SOLN
INTRAVENOUS | Status: DC | PRN
  Administered 2018-01-11 (×2): via INTRAVENOUS

## 2018-01-11 MED ORDER — 0.9 % SODIUM CHLORIDE (POUR BTL) OPTIME
TOPICAL | Status: DC | PRN
  Administered 2018-01-11: 1000 mL

## 2018-01-11 MED ORDER — PROPOFOL 500 MG/50ML IV EMUL
INTRAVENOUS | Status: DC | PRN
  Administered 2018-01-11: 125 ug/kg/min via INTRAVENOUS

## 2018-01-11 MED ORDER — FENTANYL CITRATE (PF) 100 MCG/2ML IJ SOLN
INTRAMUSCULAR | Status: DC | PRN
  Administered 2018-01-11 (×2): 50 ug via INTRAVENOUS

## 2018-01-11 MED ORDER — SCOPOLAMINE 1 MG/3DAYS TD PT72
MEDICATED_PATCH | TRANSDERMAL | Status: AC
  Filled 2018-01-11: qty 1

## 2018-01-11 MED ORDER — DEXAMETHASONE SODIUM PHOSPHATE 10 MG/ML IJ SOLN
INTRAMUSCULAR | Status: AC
  Filled 2018-01-11: qty 1

## 2018-01-11 MED ORDER — SCOPOLAMINE 1 MG/3DAYS TD PT72
MEDICATED_PATCH | TRANSDERMAL | Status: DC | PRN
  Administered 2018-01-11: 1 via TRANSDERMAL

## 2018-01-11 MED ORDER — SODIUM CHLORIDE 0.9 % IR SOLN
Status: DC | PRN
  Administered 2018-01-11: 3000 mL via INTRAVESICAL

## 2018-01-11 SURGICAL SUPPLY — 21 items
BAG URO CATCHER STRL LF (MISCELLANEOUS) ×3 IMPLANT
BASKET LASER NITINOL 1.9FR (BASKET) IMPLANT
BASKET ZERO TIP NITINOL 2.4FR (BASKET) IMPLANT
CATH INTERMIT  6FR 70CM (CATHETERS) IMPLANT
CATH URET 5FR 28IN CONE TIP (BALLOONS)
CATH URET 5FR 70CM CONE TIP (BALLOONS) IMPLANT
CLOTH BEACON ORANGE TIMEOUT ST (SAFETY) IMPLANT
COVER WAND RF STERILE (DRAPES) IMPLANT
EXTRACTOR STONE 1.7FRX115CM (UROLOGICAL SUPPLIES) IMPLANT
FIBER LASER FLEXIVA 365 (UROLOGICAL SUPPLIES) IMPLANT
FIBER LASER TRAC TIP (UROLOGICAL SUPPLIES) IMPLANT
GLOVE BIO SURGEON STRL SZ7.5 (GLOVE) ×3 IMPLANT
GOWN STRL REUS W/TWL XL LVL3 (GOWN DISPOSABLE) ×3 IMPLANT
GUIDEWIRE ANG ZIPWIRE 038X150 (WIRE) ×3 IMPLANT
GUIDEWIRE STR DUAL SENSOR (WIRE) ×3 IMPLANT
INFUSOR MANOMETER BAG 3000ML (MISCELLANEOUS) IMPLANT
MANIFOLD NEPTUNE II (INSTRUMENTS) ×3 IMPLANT
PACK CYSTO (CUSTOM PROCEDURE TRAY) ×3 IMPLANT
SHEATH URETERAL 12FRX28CM (UROLOGICAL SUPPLIES) IMPLANT
SHEATH URETERAL 12FRX35CM (MISCELLANEOUS) IMPLANT
TUBING UROLOGY SET (TUBING) ×3 IMPLANT

## 2018-01-11 NOTE — Anesthesia Postprocedure Evaluation (Signed)
Anesthesia Post Note  Patient: Joyce Ross  Procedure(s) Performed: CYSTOSCOPY, URETEROSCOPY AND STENT PLACEMENT (Left ) HOLMIUM LASER APPLICATION (Left )     Patient location during evaluation: PACU Anesthesia Type: General Level of consciousness: awake and alert Pain management: pain level controlled Vital Signs Assessment: post-procedure vital signs reviewed and stable Respiratory status: spontaneous breathing, nonlabored ventilation, respiratory function stable and patient connected to nasal cannula oxygen Cardiovascular status: blood pressure returned to baseline and stable Postop Assessment: no apparent nausea or vomiting Anesthetic complications: no    Last Vitals:  Vitals:   01/11/18 0915 01/11/18 1015  BP: (!) 120/92 (!) 162/86  Pulse: 89 86  Resp: 15 14  Temp:  36.7 C  SpO2: 100% 96%    Last Pain:  Vitals:   01/11/18 1015  TempSrc: Oral  PainSc: 0-No pain                 Luby Seamans L Messiyah Waterson

## 2018-01-11 NOTE — Transfer of Care (Signed)
Immediate Anesthesia Transfer of Care Note  Patient: Joyce Ross  Procedure(s) Performed: CYSTOSCOPY, URETEROSCOPY AND STENT PLACEMENT (Left ) HOLMIUM LASER APPLICATION (Left )  Patient Location: PACU  Anesthesia Type:General  Level of Consciousness: sedated  Airway & Oxygen Therapy: Patient Spontanous Breathing and Patient connected to face mask oxygen  Post-op Assessment: Report given to RN and Post -op Vital signs reviewed and stable  Post vital signs: Reviewed and stable  Last Vitals:  Vitals Value Taken Time  BP    Temp    Pulse    Resp    SpO2      Last Pain:  Vitals:   01/11/18 0626  TempSrc:   PainSc: 0-No pain      Patients Stated Pain Goal: 4 (60/67/70 3403)  Complications: No apparent anesthesia complications

## 2018-01-11 NOTE — Anesthesia Procedure Notes (Signed)
Procedure Name: LMA Insertion Date/Time: 01/11/2018 7:44 AM Performed by: Lind Covert, CRNA Pre-anesthesia Checklist: Patient identified, Emergency Drugs available, Suction available, Patient being monitored and Timeout performed Patient Re-evaluated:Patient Re-evaluated prior to induction Oxygen Delivery Method: Circle system utilized Preoxygenation: Pre-oxygenation with 100% oxygen Induction Type: IV induction LMA: LMA inserted LMA Size: 3.0 Tube type: Oral Number of attempts: 1 Placement Confirmation: positive ETCO2 and breath sounds checked- equal and bilateral Tube secured with: Tape Dental Injury: Teeth and Oropharynx as per pre-operative assessment

## 2018-01-11 NOTE — Discharge Instructions (Signed)

## 2018-01-11 NOTE — Op Note (Signed)
Operative Note  Preoperative diagnosis:  1.  Left renal and ureteral calculi  Postoperative diagnosis: 1.  Same  Procedure(s): 1.  Cystoscopy with left retrograde pyelogram, left ureteroscopy with laser lithotripsy, ureteral stent placement  Surgeon: Link Snuffer, MD  Assistants: None  Anesthesia: General  Complications: None immediate  EBL: Minimal  Specimens: 1.  None  Drains/Catheters: 1.  6 x 24 double-J ureteral stent  Intraoperative findings: 1.  Normal urethra and bladder 2.  Multiple left ureteral calculi fragmented to smaller fragments.  Retrograde pyelogram revealed upstream hydroureteronephrosis.  Indication: 67 year old female with a history of a large left ureteral calculus.  She had attempted ESWL but this had to be cut short due to respiratory distress.  She therefore presents for the previously mentioned operation.  Description of procedure:  The patient was identified and consent was obtained.  The patient was taken to the operating room and placed in the supine position.  The patient was placed under general anesthesia.  Perioperative antibiotics were administered.  The patient was placed in dorsal lithotomy.  Patient was prepped and draped in a standard sterile fashion and a timeout was performed.  A 21 French rigid cystoscope was advanced into the urethra and into the bladder.  Complete cystoscopy was performed with no abnormal findings.  The left ureter was cannulated with a sensor wire which was advanced up to the kidney under fluoroscopic guidance.  A semirigid ureteroscope was advanced alongside the wire.  The distal left ureter was somewhat tight but I was able to atraumatically navigate the scope past this area.  It did not look like a stricture, just normal tight ureter.  Immediately, distal stones were encountered.  These were fragmented to smaller fragments.  I continued to advance up the left ureter and encountered several stones which were fragmented  to smaller fragments.  The largest stone was in the proximal ureter and it also was fragmented to small fragments.  I was able to advance the scope all the way into the renal pelvis.  The entire ureter was cleared of significant stone.  I shot a retrograde pyelogram through the scope with the findings noted above.  I decided not to try flexible ureteroscopy considering the distal ureter was somewhat tight and I felt like it would be too tight for a sheath advancement.  I therefore withdrew the scope.  I backloaded the wire onto a rigid cystoscope and advanced that into the bladder.  I then placed a 6 x 24 double-J ureteral stent in a standard fashion followed by removal of the wire.  Fluoroscopy confirmed proximal placement and direct visualization confirmed a good coil within the bladder.  I drained the bladder and withdrew the scope.  This concluded the operation.  The patient tolerated the procedure well.  Plan: Follow-up in 1 week for stent removal.  She had a somewhat atrophic kidney on the left side indicating the stone may be long-standing and may have been obstructing for some time.  She may benefit from a renal scan with Lasix to rule out nonfunctional kidney prior to considering further surgical intervention for lower pole stones.

## 2018-01-11 NOTE — H&P (Signed)
H&P  Chief Complaint: Left ureteral stone  History of Present Illness: 80 YOP F with left renal and ureteral calculi here for URS/LL/stent  Past Medical History:  Diagnosis Date  . Coronary artery calcification seen on CAT scan 09/28/2017  . Full dentures   . GERD (gastroesophageal reflux disease)   . Heart murmur    found a couple of years ago no problems  . History of kidney stones   . History of recurrent UTIs   . Hyperlipidemia   . Left-sided Bell's palsy 1990s   residual left facial drooping  . Mild intermittent asthma    followed by pcp  . Numbness and tingling in left arm    residual from cervical neck surgery going from shoulder down to hand, not completely  . OA (osteoarthritis)    hands, spine, hips, right leg  . Peripheral neuropathy    feet  . PONV (postoperative nausea and vomiting)   . Thoracic ascending aortic aneurysm ([place])    CT 09-28-2017--- 4.3 cm  . Thyroid nodule    ultrasound w/ bx 09/ 2019,  per pt was benign  . White coat syndrome without hypertension    Past Surgical History:  Procedure Laterality Date  . ANTERIOR CERVICAL DECOMP/DISCECTOMY FUSION  1980s  . CARPAL TUNNEL RELEASE Bilateral 2000  . CESAREAN SECTION  x2 last one 35  . EXTRACORPOREAL SHOCK WAVE LITHOTRIPSY Left 12/05/2017   Procedure: LEFT EXTRACORPOREAL SHOCK WAVE LITHOTRIPSY (ESWL);  Surgeon: Franchot Gallo, MD;  Location: [facility];  Service: Urology;  Laterality: Left;  . EXTRACORPOREAL SHOCK WAVE LITHOTRIPSY  1990s  . MOUTH SURGERY     all teeth pulled    Home Medications:  Medications Prior to Admission  Medication Sig Dispense Refill Last Dose  . acetaminophen (TYLENOL) 325 MG tablet Take 325 mg by mouth every 6 (six) hours as needed for moderate pain.    Past Week at Unknown time  . albuterol (PROVENTIL HFA;VENTOLIN HFA) 108 (90 Base) MCG/ACT inhaler Inhale 1-2 puffs into the lungs every 6 (six) hours as needed for wheezing or shortness of breath.   01/10/2018 at  Unknown time  . budesonide-formoterol (SYMBICORT) 80-4.5 MCG/ACT inhaler Inhale 2 puffs into the lungs 2 (two) times daily. (Patient taking differently: Inhale 2 puffs into the lungs 2 (two) times daily. ) 1 Inhaler 12 01/11/2018 at Unknown time  . cetirizine (ZYRTEC) 10 MG tablet Take 1 tablet (10 mg total) by mouth daily. (Patient taking differently: Take 10 mg by mouth every evening. ) 30 tablet 11 01/09/2018  . citalopram (CELEXA) 40 MG tablet Take 1 tablet (40 mg total) by mouth daily. (Patient taking differently: Take 40 mg by mouth every evening. ) 90 tablet 1 01/09/2018  . cyclobenzaprine (FLEXERIL) 10 MG tablet Take 1 tablet (10 mg total) by mouth 3 (three) times daily as needed. for muscle spams 60 tablet 2 01/09/2018  . fluticasone (FLONASE) 50 MCG/ACT nasal spray Place 2 sprays into both nostrils daily. (Patient taking differently: Place 2 sprays into both nostrils 2 (two) times daily. ) 16 g 6 01/11/2018 at Unknown time  . Melatonin 1 MG TABS Take 1 mg by mouth at bedtime as needed (sleep).    01/09/2018 at Unknown time  . pravastatin (PRAVACHOL) 80 MG tablet TAKE ONE TABLET BY MOUTH AT BEDTIME (Patient taking differently: Take 80 mg by mouth at bedtime. ) 30 tablet 1 01/09/2018  . predniSONE (DELTASONE) 20 MG tablet Take 20 mg by mouth daily.  0 01/06/2018  .  ranitidine (ZANTAC) 150 MG tablet Take 1 tablet (150 mg total) by mouth 2 (two) times daily. (Patient taking differently: Take 150 mg by mouth every evening. Per pt and takes in morning if needed) 180 tablet 1 01/11/2018 at 0330  . traMADol (ULTRAM) 50 MG tablet Take 50 mg by mouth every 6 (six) hours as needed for pain.  0 Past Week at Unknown time  . gabapentin (NEURONTIN) 300 MG capsule TAKE ONE CAPSULE BY MOUTH three TIMES A DAY (Patient taking differently: Take 300 mg by mouth every evening. TAKE ONE CAPSULE BY MOUTH three TIMES A DAY) 90 capsule 4 01/09/2018  . metoprolol tartrate (LOPRESSOR) 25 MG tablet Take 0.5 tablets (12.5  mg total) by mouth 2 (two) times daily. 90 tablet 3 Not Taking at Unknown time  . oxyCODONE (ROXICODONE) 5 MG immediate release tablet Take 1 tablet (5 mg total) by mouth every 4 (four) hours as needed for severe pain. (Patient not taking: Reported on 01/04/2018) 15 tablet 0 Not Taking at Unknown time   Allergies:  Allergies  Allergen Reactions  . Adhesive [Tape] Other (See Comments)    "leaves red places on skin"  . Penicillins Other (See Comments)    Thrush Has patient had a PCN reaction causing immediate rash, facial/tongue/throat swelling, SOB or lightheadedness with hypotension: No Has patient had a PCN reaction causing severe rash involving mucus membranes or skin necrosis: No Has patient had a PCN reaction that required hospitalization: No Has patient had a PCN reaction occurring within the last 10 years: Yes If all of the above answers are "NO", then may proceed with Cephalosporin use.   . Radish [Raphanus Sativus] Hives  . Yellow Jacket Venom Itching and Rash    Family History  Problem Relation Age of Onset  . Heart disease Mother   . Heart disease Father        heart attack  . Heart disease Sister   . Diabetes Brother   . Diabetes Brother        sepsis  . Heart disease Brother    Social History:  reports that she quit smoking about 5 years ago. Her smoking use included cigarettes. She quit after 44.00 years of use. She has never used smokeless tobacco. She reports that she does not drink alcohol or use drugs.  ROS: A complete review of systems was performed.  All systems are negative except for pertinent findings as noted. ROS   Physical Exam:  Vital signs in last 24 hours: Temp:  [98.8 F (37.1 C)] 98.8 F (37.1 C) (11/27 0603) Pulse Rate:  [94] 94 (11/27 0603) Resp:  [18] 18 (11/27 0603) BP: (156)/(80) 156/80 (11/27 0603) SpO2:  [95 %] 95 % (11/27 0603) Weight:  [79.4 kg] 79.4 kg (11/27 0626) General:  Alert and oriented, No acute distress HEENT:  Normocephalic, atraumatic Neck: No JVD or lymphadenopathy Cardiovascular: Regular rate and rhythm Lungs: Regular rate and effort Abdomen: Soft, nontender, nondistended, no abdominal masses Back: No CVA tenderness Extremities: No edema Neurologic: Grossly intact  Laboratory Data:  No results found for this or any previous visit (from the past 24 hour(s)). No results found for this or any previous visit (from the past 240 hour(s)). Creatinine: Recent Labs    01/05/18 1622  CREATININE 1.13*    Impression/Assessment:  Left renal and ureteral calculi   Plan:  Proceed with URS/LL/stent placment  Marton Redwood, III 01/11/2018, 7:34 AM

## 2018-01-12 ENCOUNTER — Encounter (HOSPITAL_COMMUNITY): Payer: Self-pay | Admitting: Urology

## 2018-01-13 LAB — HM MAMMOGRAPHY

## 2018-01-18 DIAGNOSIS — N132 Hydronephrosis with renal and ureteral calculous obstruction: Secondary | ICD-10-CM | POA: Diagnosis not present

## 2018-01-18 DIAGNOSIS — N201 Calculus of ureter: Secondary | ICD-10-CM | POA: Diagnosis not present

## 2018-01-25 ENCOUNTER — Other Ambulatory Visit: Payer: Self-pay | Admitting: Pediatrics

## 2018-01-25 DIAGNOSIS — N6320 Unspecified lump in the left breast, unspecified quadrant: Secondary | ICD-10-CM

## 2018-02-01 ENCOUNTER — Other Ambulatory Visit: Payer: Self-pay | Admitting: Pediatrics

## 2018-02-03 ENCOUNTER — Telehealth: Payer: Self-pay | Admitting: Pediatrics

## 2018-02-03 NOTE — Telephone Encounter (Signed)
PT is wanting to know if we have samples of budesonide-formoterol (SYMBICORT) 80-4.5 MCG/ACT inhaler

## 2018-02-03 NOTE — Telephone Encounter (Signed)
Aware out of samples at this time

## 2018-02-21 ENCOUNTER — Ambulatory Visit (HOSPITAL_COMMUNITY): Admission: RE | Admit: 2018-02-21 | Payer: Medicare Other | Source: Ambulatory Visit

## 2018-02-21 ENCOUNTER — Ambulatory Visit (HOSPITAL_COMMUNITY)
Admission: RE | Admit: 2018-02-21 | Discharge: 2018-02-21 | Disposition: A | Discharge status: Home / Self Care | Payer: Medicare Other | Source: Ambulatory Visit | Attending: Pediatrics | Admitting: Pediatrics

## 2018-02-21 ENCOUNTER — Encounter (HOSPITAL_COMMUNITY): Payer: Medicare Other

## 2018-02-21 DIAGNOSIS — N6320 Unspecified lump in the left breast, unspecified quadrant: Secondary | ICD-10-CM | POA: Insufficient documentation

## 2018-02-22 ENCOUNTER — Telehealth: Payer: Self-pay | Admitting: Pediatrics

## 2018-02-22 NOTE — Telephone Encounter (Signed)
Aware. 

## 2018-02-23 ENCOUNTER — Other Ambulatory Visit: Payer: Self-pay | Admitting: Urology

## 2018-02-23 ENCOUNTER — Other Ambulatory Visit (HOSPITAL_COMMUNITY): Payer: Self-pay | Admitting: Urology

## 2018-02-23 DIAGNOSIS — N200 Calculus of kidney: Secondary | ICD-10-CM | POA: Diagnosis not present

## 2018-02-23 DIAGNOSIS — N130 Hydronephrosis with ureteropelvic junction obstruction: Secondary | ICD-10-CM | POA: Diagnosis not present

## 2018-02-26 ENCOUNTER — Other Ambulatory Visit: Payer: Self-pay | Admitting: Pediatrics

## 2018-02-26 DIAGNOSIS — E785 Hyperlipidemia, unspecified: Secondary | ICD-10-CM

## 2018-02-27 ENCOUNTER — Other Ambulatory Visit (HOSPITAL_COMMUNITY): Payer: Self-pay | Admitting: Pediatrics

## 2018-02-27 DIAGNOSIS — R928 Other abnormal and inconclusive findings on diagnostic imaging of breast: Secondary | ICD-10-CM

## 2018-02-27 NOTE — Telephone Encounter (Signed)
Last lipid 12/02/16

## 2018-02-28 DIAGNOSIS — N130 Hydronephrosis with ureteropelvic junction obstruction: Secondary | ICD-10-CM | POA: Diagnosis not present

## 2018-02-28 DIAGNOSIS — N132 Hydronephrosis with renal and ureteral calculous obstruction: Secondary | ICD-10-CM | POA: Diagnosis not present

## 2018-03-03 ENCOUNTER — Ambulatory Visit (HOSPITAL_COMMUNITY)
Admission: RE | Admit: 2018-03-03 | Discharge: 2018-03-03 | Disposition: A | Discharge status: Home / Self Care | Payer: Medicare Other | Source: Ambulatory Visit | Attending: Urology | Admitting: Urology

## 2018-03-03 DIAGNOSIS — N130 Hydronephrosis with ureteropelvic junction obstruction: Secondary | ICD-10-CM | POA: Insufficient documentation

## 2018-03-03 DIAGNOSIS — N1339 Other hydronephrosis: Secondary | ICD-10-CM | POA: Diagnosis not present

## 2018-03-03 MED ORDER — FUROSEMIDE 10 MG/ML IJ SOLN
INTRAMUSCULAR | Status: AC
  Filled 2018-03-03: qty 4

## 2018-03-03 MED ORDER — FUROSEMIDE 10 MG/ML IJ SOLN
40.0000 mg | Freq: Once | INTRAMUSCULAR | Status: AC
  Administered 2018-03-03: 40 mg via INTRAVENOUS

## 2018-03-03 MED ORDER — TECHNETIUM TC 99M MERTIATIDE
5.2000 | Freq: Once | INTRAVENOUS | Status: AC | PRN
  Administered 2018-03-03: 5.2 via INTRAVENOUS

## 2018-03-07 ENCOUNTER — Ambulatory Visit (HOSPITAL_COMMUNITY)
Admission: RE | Admit: 2018-03-07 | Discharge: 2018-03-07 | Disposition: A | Discharge status: Home / Self Care | Payer: Medicare Other | Source: Ambulatory Visit | Attending: Pediatrics | Admitting: Pediatrics

## 2018-03-07 DIAGNOSIS — N6489 Other specified disorders of breast: Secondary | ICD-10-CM | POA: Diagnosis not present

## 2018-03-07 DIAGNOSIS — R928 Other abnormal and inconclusive findings on diagnostic imaging of breast: Secondary | ICD-10-CM | POA: Insufficient documentation

## 2018-03-09 DIAGNOSIS — N130 Hydronephrosis with ureteropelvic junction obstruction: Secondary | ICD-10-CM | POA: Diagnosis not present

## 2018-03-09 DIAGNOSIS — N201 Calculus of ureter: Secondary | ICD-10-CM | POA: Diagnosis not present

## 2018-03-16 ENCOUNTER — Ambulatory Visit: Payer: PRIVATE HEALTH INSURANCE | Admitting: Pediatrics

## 2018-04-07 ENCOUNTER — Telehealth: Payer: Self-pay | Admitting: Pediatrics

## 2018-04-07 NOTE — Telephone Encounter (Signed)
Samples ready for pick up.  Patient aware 

## 2018-04-12 DIAGNOSIS — N201 Calculus of ureter: Secondary | ICD-10-CM | POA: Diagnosis not present

## 2018-04-20 DIAGNOSIS — N130 Hydronephrosis with ureteropelvic junction obstruction: Secondary | ICD-10-CM | POA: Diagnosis not present

## 2018-04-20 DIAGNOSIS — N200 Calculus of kidney: Secondary | ICD-10-CM | POA: Diagnosis not present

## 2018-05-01 ENCOUNTER — Telehealth: Payer: Self-pay | Admitting: Physician Assistant

## 2018-05-01 NOTE — Telephone Encounter (Signed)
No samples available, patient aware 

## 2018-05-09 ENCOUNTER — Ambulatory Visit: Payer: Medicare Other | Admitting: Physician Assistant

## 2018-05-09 ENCOUNTER — Encounter: Payer: Self-pay | Admitting: Physician Assistant

## 2018-05-09 ENCOUNTER — Other Ambulatory Visit: Payer: Self-pay

## 2018-05-09 VITALS — BP 136/73 | HR 76 | Temp 99.1°F | Ht 58.5 in | Wt 178.2 lb

## 2018-05-09 DIAGNOSIS — E041 Nontoxic single thyroid nodule: Secondary | ICD-10-CM

## 2018-05-09 DIAGNOSIS — N1330 Unspecified hydronephrosis: Secondary | ICD-10-CM

## 2018-05-09 DIAGNOSIS — Z0000 Encounter for general adult medical examination without abnormal findings: Secondary | ICD-10-CM

## 2018-05-09 DIAGNOSIS — R0681 Apnea, not elsewhere classified: Secondary | ICD-10-CM

## 2018-05-09 DIAGNOSIS — G629 Polyneuropathy, unspecified: Secondary | ICD-10-CM | POA: Diagnosis not present

## 2018-05-09 NOTE — Progress Notes (Signed)
BP 136/73   Pulse 76   Temp 99.1 F (37.3 C) (Oral)   Ht 4' 10.5" (1.486 m)   Wt 178 lb 3.2 oz (80.8 kg)   BMI 36.61 kg/m    Subjective:    [name], female    DOB: [birth year], 68 y.o.   MRN: [id number]  HPI: Joyce Ross is a 68 y.o. female presenting on 05/09/2018 for Establish Care (Dr. Evette Doffing patient )  This patient comes in for periodic recheck on her chronic medical conditions.  She has currently a thyroid nodule that is being followed.  She needs it rechecked again in September.  We have talked with radiology at our office to set up a reminder under my name.  She also has hydronephrosis after having significant kidney stones.  She does have a decreased kidney function 1 of her kidneys because of hydronephrosis.  They are still considering whether to do a nephrectomy or not.  She recently had a procedure and had a hard time being woken and they suggested that she get evaluated for sleep apnea.  Also because of some witnessed sleep apnea by her medical professional we will get this set up.  All of her other medications and conditions are reviewed.  Past Medical History:  Diagnosis Date  . Coronary artery calcification seen on CAT scan 09/28/2017  . Full dentures   . GERD (gastroesophageal reflux disease)   . Heart murmur    found a couple of years ago no problems  . History of kidney stones   . History of recurrent UTIs   . Hyperlipidemia   . Left-sided Bell's palsy 1990s   residual left facial drooping  . Mild intermittent asthma    followed by pcp  . Numbness and tingling in left arm    residual from cervical neck surgery going from shoulder down to hand, not completely  . OA (osteoarthritis)    hands, spine, hips, right leg  . Peripheral neuropathy    feet  . PONV (postoperative nausea and vomiting)   . Thoracic ascending aortic aneurysm ([place])    CT 09-28-2017--- 4.3 cm  . Thyroid nodule    ultrasound w/ bx 09/ 2019,  per pt was benign  .  White coat syndrome without hypertension    Relevant past medical, surgical, family and social history reviewed and updated as indicated. Interim medical history since our last visit reviewed. Allergies and medications reviewed and updated. DATA REVIEWED: CHART IN EPIC  Family History reviewed for pertinent findings.  Review of Systems  Constitutional: Negative.  Negative for activity change, fatigue and fever.  HENT: Negative.   Eyes: Negative.   Respiratory: Negative.  Negative for cough.   Cardiovascular: Negative.  Negative for chest pain.  Gastrointestinal: Negative.  Negative for abdominal pain.  Endocrine: Negative.   Genitourinary: Negative.  Negative for dysuria.  Musculoskeletal: Positive for arthralgias and myalgias.  Skin: Negative.   Neurological: Positive for weakness.    Allergies as of 05/09/2018      Reactions   Adhesive [tape] Other (See Comments)   "leaves red places on skin"   Penicillins Other (See Comments)   Thrush Has patient had a PCN reaction causing immediate rash, facial/tongue/throat swelling, SOB or lightheadedness with hypotension: No Has patient had a PCN reaction causing severe rash involving mucus membranes or skin necrosis: No Has patient had a PCN reaction that required hospitalization: No Has patient had a PCN reaction occurring within the last 10 years:  Yes If all of the above answers are "NO", then may proceed with Cephalosporin use.   Radish [raphanus Sativus] Hives   Yellow Jacket Venom Itching, Rash      Medication List       Accurate as of May 09, 2018  7:06 PM. Always use your most recent med list.        acetaminophen 325 MG tablet Commonly known as:  TYLENOL Take 325 mg by mouth every 6 (six) hours as needed for moderate pain.   albuterol 108 (90 Base) MCG/ACT inhaler Commonly known as:  PROVENTIL HFA;VENTOLIN HFA Inhale 1-2 puffs into the lungs every 6 (six) hours as needed for wheezing or shortness of breath.    budesonide-formoterol 80-4.5 MCG/ACT inhaler Commonly known as:  Symbicort Inhale 2 puffs into the lungs 2 (two) times daily.   cetirizine 10 MG tablet Commonly known as:  ZYRTEC Take 1 tablet (10 mg total) by mouth daily.   citalopram 40 MG tablet Commonly known as:  CELEXA Take 1 tablet (40 mg total) by mouth daily.   cyclobenzaprine 10 MG tablet Commonly known as:  FLEXERIL Take 1 tablet (10 mg total) by mouth 3 (three) times daily as needed. for muscle spams   fluticasone 50 MCG/ACT nasal spray Commonly known as:  FLONASE Place 2 sprays into both nostrils daily.   gabapentin 300 MG capsule Commonly known as:  NEURONTIN TAKE ONE CAPSULE BY MOUTH three TIMES A DAY   Melatonin 1 MG Tabs Take 1 mg by mouth at bedtime as needed (sleep).   metoprolol tartrate 25 MG tablet Commonly known as:  LOPRESSOR Take 0.5 tablets (12.5 mg total) by mouth 2 (two) times daily.   pravastatin 80 MG tablet Commonly known as:  PRAVACHOL TAKE ONE TABLET BY MOUTH AT BEDTIME   ranitidine 150 MG tablet Commonly known as:  ZANTAC Take 1 tablet (150 mg total) by mouth 2 (two) times daily.          Objective:    BP 136/73   Pulse 76   Temp 99.1 F (37.3 C) (Oral)   Ht 4' 10.5" (1.486 m)   Wt 178 lb 3.2 oz (80.8 kg)   BMI 36.61 kg/m   Allergies  Allergen Reactions  . Adhesive [Tape] Other (See Comments)    "leaves red places on skin"  . Penicillins Other (See Comments)    Thrush Has patient had a PCN reaction causing immediate rash, facial/tongue/throat swelling, SOB or lightheadedness with hypotension: No Has patient had a PCN reaction causing severe rash involving mucus membranes or skin necrosis: No Has patient had a PCN reaction that required hospitalization: No Has patient had a PCN reaction occurring within the last 10 years: Yes If all of the above answers are "NO", then may proceed with Cephalosporin use.   . Radish [Raphanus Sativus] Hives  . Yellow Jacket Venom  Itching and Rash    Wt Readings from Last 3 Encounters:  05/09/18 178 lb 3.2 oz (80.8 kg)  01/11/18 175 lb 2 oz (79.4 kg)  01/05/18 175 lb 2 oz (79.4 kg)    Physical Exam Constitutional:      Appearance: She is well-developed.  HENT:     Head: Normocephalic and atraumatic.  Eyes:     Conjunctiva/sclera: Conjunctivae normal.     Pupils: Pupils are equal, round, and reactive to light.  Cardiovascular:     Rate and Rhythm: Normal rate and regular rhythm.     Heart sounds: Normal heart sounds.  Pulmonary:  Effort: Pulmonary effort is normal.     Breath sounds: Normal breath sounds.  Abdominal:     General: Bowel sounds are normal.     Palpations: Abdomen is soft.  Skin:    General: Skin is warm and dry.     Findings: No rash.  Neurological:     Mental Status: She is alert and oriented to person, place, and time.     Deep Tendon Reflexes: Reflexes are normal and symmetric.  Psychiatric:        Behavior: Behavior normal.        Thought Content: Thought content normal.        Judgment: Judgment normal.     Results for orders placed or performed in visit on 04/12/18  HM MAMMOGRAPHY  Result Value Ref Range   HM Mammogram 0-4 Bi-Rad 0-4 Bi-Rad, Self Reported Normal      Assessment & Plan:   1. Thyroid nodule September 2020 needs recheck of the nodule  2. Hydronephrosis, unspecified hydronephrosis type Continue with urology - CMP14+EGFR  3. Neuropathy - CMP14+EGFR  4. Well adult exam - Lipid panel - Bayer DCA Hb A1c Waived - CMP14+EGFR  5. Episode of apnea - Ambulatory referral to Sleep Studies   Continue all other maintenance medications as listed above.  Follow up plan: Return in about 4 months (around 09/08/2018).  Visit in the office and labs  Terald Sleeper PA-C [facility] [address]  [place], [place] [address] [phone]   05/09/2018, 7:06 PM

## 2018-06-14 ENCOUNTER — Telehealth: Payer: Self-pay

## 2018-06-14 DIAGNOSIS — N200 Calculus of kidney: Secondary | ICD-10-CM | POA: Diagnosis not present

## 2018-06-14 DIAGNOSIS — N130 Hydronephrosis with ureteropelvic junction obstruction: Secondary | ICD-10-CM | POA: Diagnosis not present

## 2018-06-14 NOTE — Telephone Encounter (Signed)
FYI, patient declined referral to neurologist for sleep study.

## 2018-06-29 DIAGNOSIS — N1330 Unspecified hydronephrosis: Secondary | ICD-10-CM | POA: Diagnosis not present

## 2018-06-29 DIAGNOSIS — N132 Hydronephrosis with renal and ureteral calculous obstruction: Secondary | ICD-10-CM | POA: Diagnosis not present

## 2018-06-30 DIAGNOSIS — N202 Calculus of kidney with calculus of ureter: Secondary | ICD-10-CM | POA: Diagnosis not present

## 2018-06-30 DIAGNOSIS — N132 Hydronephrosis with renal and ureteral calculous obstruction: Secondary | ICD-10-CM | POA: Diagnosis not present

## 2018-07-03 ENCOUNTER — Other Ambulatory Visit: Payer: Self-pay | Admitting: Pediatrics

## 2018-07-03 DIAGNOSIS — J309 Allergic rhinitis, unspecified: Secondary | ICD-10-CM

## 2018-07-03 DIAGNOSIS — F339 Major depressive disorder, recurrent, unspecified: Secondary | ICD-10-CM

## 2018-07-13 ENCOUNTER — Other Ambulatory Visit: Payer: Self-pay | Admitting: Urology

## 2018-07-25 NOTE — Progress Notes (Signed)
CHEST CT 09-28-17 EPIC

## 2018-07-25 NOTE — Patient Instructions (Addendum)
Joyce Ross    Your procedure is scheduled on: 07-31-2018     Report to admitting at 1100 AM   [place] [facility] TEST ON 07-27-18  @ 3:15 PM. THIS TEST MUST BE DONE BEFORE SURGERY, COME TO [facility]. ONCE YOUR TEST IS COMPLETED, PLEASE BEGIN THE QUARANTINE INSTRUCTIONS AS OUTLINED IN YOUR HANDOUT.   Call this number if you have problems the morning of surgery [phone]    Remember: Do not eat food:After Midnight. CLEAR LIQUIDS FROM MIDNIGHT UNTIL 700 AM. NOTHING BY MOUTH AFTER 700 AM.   BRUSH YOUR TEETH MORNING OF SURGERY AND RINSE YOUR MOUTH OUT, NO CHEWING GUM CANDY OR MINTS.      CLEAR LIQUID DIET   Foods Allowed                                                                     Foods Excluded  Coffee and tea, regular and decaf                             liquids that you cannot  Plain Jell-O in any flavor                                             see through such as: Fruit ices (not with fruit pulp)                                     milk, soups, orange juice  Iced Popsicles                                    All solid food Carbonated beverages, regular and diet                                    Cranberry, grape and apple juices Sports drinks like Gatorade Lightly seasoned clear broth or consume(fat free) Sugar, honey syrup  Sample Menu Breakfast                                Lunch                                     Supper Cranberry juice                    Beef broth                            Chicken broth Jell-O  Grape juice                           Apple juice Coffee or tea                        Jell-O                                      Popsicle                                                Coffee or tea                        Coffee or tea  _____________________________________________________________________    Take these medicines the morning of  surgery with A SIP OF WATER: None                               You may not have any metal on your body including hair pins and              piercings     Do not wear jewelry, make-up, lotions, powders or perfumes, deodorant              Do not wear nail polish.  Do not shave  48 hours prior to surgery.                Do not bring valuables to the hospital. [place].  Contacts, dentures or bridgework may not be worn into surgery.      Patients discharged the day of surgery will not be allowed to drive home. IF YOU ARE HAVING SURGERY AND GOING HOME THE SAME DAY, YOU MUST HAVE AN ADULT TO DRIVE YOU HOME AND BE WITH YOU FOR 24 HOURS. YOU MAY GO HOME BY TAXI OR UBER OR ORTHERWISE, BUT AN ADULT MUST ACCOMPANY YOU HOME AND STAY WITH YOU FOR 24 HOURS.    Name and phone number of your driver: Joyce Ross [phone]  Special Instructions: N/A              Please read over the following fact sheets you were given: _____________________________________________________________________             [facility] - Preparing for Surgery Before surgery, you can play an important role.  Because skin is not sterile, your skin needs to be as free of germs as possible.  You can reduce the number of germs on your skin by washing with CHG (chlorahexidine gluconate) soap before surgery.  CHG is an antiseptic cleaner which kills germs and bonds with the skin to continue killing germs even after washing. Please DO NOT use if you have an allergy to CHG or antibacterial soaps.  If your skin becomes reddened/irritated stop using the CHG and inform your nurse when you arrive at Short Stay. Do not shave (including legs and underarms) for at least 48 hours prior to the first CHG shower.  You may shave your face/neck. Please follow these instructions carefully:  1.  Shower with CHG Soap the night before surgery and the  morning of Surgery.  2.  If you choose to wash  your hair, wash your hair first as usual with your  normal  shampoo.  3.  After you shampoo, rinse your hair and body thoroughly to remove the  shampoo.                           4.  Use CHG as you would any other liquid soap.  You can apply chg directly  to the skin and wash                       Gently with a scrungie or clean washcloth.  5.  Apply the CHG Soap to your body ONLY FROM THE NECK DOWN.   Do not use on face/ open                           Wound or open sores. Avoid contact with eyes, ears mouth and genitals (private parts).                       Wash face,  Genitals (private parts) with your normal soap.             6.  Wash thoroughly, paying special attention to the area where your surgery  will be performed.  7.  Thoroughly rinse your body with warm water from the neck down.  8.  DO NOT shower/wash with your normal soap after using and rinsing off  the CHG Soap.                9.  Pat yourself dry with a clean towel.            10.  Wear clean pajamas.            11.  Place clean sheets on your bed the night of your first shower and do not  sleep with pets. Day of Surgery : Do not apply any lotions/deodorants the morning of surgery.  Please wear clean clothes to the hospital/surgery center.  FAILURE TO FOLLOW THESE INSTRUCTIONS MAY RESULT IN THE CANCELLATION OF YOUR SURGERY PATIENT SIGNATURE_________________________________  NURSE SIGNATURE__________________________________  ________________________________________________________________________

## 2018-07-26 ENCOUNTER — Encounter (HOSPITAL_COMMUNITY)
Admission: RE | Admit: 2018-07-26 | Discharge: 2018-07-26 | Disposition: A | Discharge status: Home / Self Care | Payer: Medicare Other | Source: Ambulatory Visit | Attending: Urology | Admitting: Urology

## 2018-07-26 ENCOUNTER — Encounter (HOSPITAL_COMMUNITY): Payer: Self-pay

## 2018-07-26 ENCOUNTER — Other Ambulatory Visit: Payer: Self-pay

## 2018-07-26 DIAGNOSIS — Z981 Arthrodesis status: Secondary | ICD-10-CM | POA: Insufficient documentation

## 2018-07-26 DIAGNOSIS — Z87891 Personal history of nicotine dependence: Secondary | ICD-10-CM | POA: Insufficient documentation

## 2018-07-26 DIAGNOSIS — J45909 Unspecified asthma, uncomplicated: Secondary | ICD-10-CM | POA: Insufficient documentation

## 2018-07-26 DIAGNOSIS — N201 Calculus of ureter: Secondary | ICD-10-CM | POA: Diagnosis not present

## 2018-07-26 DIAGNOSIS — Z79899 Other long term (current) drug therapy: Secondary | ICD-10-CM | POA: Insufficient documentation

## 2018-07-26 DIAGNOSIS — I2510 Atherosclerotic heart disease of native coronary artery without angina pectoris: Secondary | ICD-10-CM | POA: Insufficient documentation

## 2018-07-26 DIAGNOSIS — K219 Gastro-esophageal reflux disease without esophagitis: Secondary | ICD-10-CM | POA: Diagnosis not present

## 2018-07-26 DIAGNOSIS — I714 Abdominal aortic aneurysm, without rupture: Secondary | ICD-10-CM | POA: Diagnosis not present

## 2018-07-26 DIAGNOSIS — Z87440 Personal history of urinary (tract) infections: Secondary | ICD-10-CM | POA: Diagnosis not present

## 2018-07-26 DIAGNOSIS — Z01818 Encounter for other preprocedural examination: Secondary | ICD-10-CM | POA: Insufficient documentation

## 2018-07-26 DIAGNOSIS — E785 Hyperlipidemia, unspecified: Secondary | ICD-10-CM | POA: Insufficient documentation

## 2018-07-26 DIAGNOSIS — Z7951 Long term (current) use of inhaled steroids: Secondary | ICD-10-CM | POA: Insufficient documentation

## 2018-07-26 DIAGNOSIS — Z1159 Encounter for screening for other viral diseases: Secondary | ICD-10-CM | POA: Insufficient documentation

## 2018-07-26 LAB — BASIC METABOLIC PANEL
Anion gap: 10 (ref 5–15)
BUN: 24 mg/dL — ABNORMAL HIGH (ref 8–23)
CO2: 27 mmol/L (ref 22–32)
Calcium: 9.3 mg/dL (ref 8.9–10.3)
Chloride: 105 mmol/L (ref 98–111)
Creatinine, Ser: 1.32 mg/dL — ABNORMAL HIGH (ref 0.44–1.00)
GFR calc Af Amer: 48 mL/min — ABNORMAL LOW (ref >60)
GFR calc non Af Amer: 42 mL/min — ABNORMAL LOW (ref >60)
Glucose, Bld: 92 mg/dL (ref 70–99)
Potassium: 5.1 mmol/L (ref 3.5–5.1)
Sodium: 142 mmol/L (ref 135–145)

## 2018-07-26 LAB — CBC
HCT: 40.4 % (ref 36.0–46.0)
Hemoglobin: 12.8 g/dL (ref 12.0–15.0)
MCH: 31.4 pg (ref 26.0–34.0)
MCHC: 31.7 g/dL (ref 30.0–36.0)
MCV: 99 fL (ref 80.0–100.0)
Platelets: 296 10*3/uL (ref 150–400)
RBC: 4.08 MIL/uL (ref 3.87–5.11)
RDW: 13.2 % (ref 11.5–15.5)
WBC: 9.2 10*3/uL (ref 4.0–10.5)
nRBC: 0 % (ref 0.0–0.2)

## 2018-07-26 NOTE — Progress Notes (Signed)
01-14-18 LOV noted per Dr. Assunta Found. Thoracic Aortic Aneurysm noted w/follow up in 1 year.

## 2018-07-27 ENCOUNTER — Other Ambulatory Visit (HOSPITAL_COMMUNITY)
Admission: RE | Admit: 2018-07-27 | Discharge: 2018-07-27 | Disposition: A | Discharge status: Home / Self Care | Payer: Medicare Other | Source: Ambulatory Visit | Attending: Urology | Admitting: Urology

## 2018-07-27 DIAGNOSIS — N201 Calculus of ureter: Secondary | ICD-10-CM | POA: Diagnosis not present

## 2018-07-27 DIAGNOSIS — J45909 Unspecified asthma, uncomplicated: Secondary | ICD-10-CM | POA: Diagnosis not present

## 2018-07-27 DIAGNOSIS — Z87891 Personal history of nicotine dependence: Secondary | ICD-10-CM | POA: Diagnosis not present

## 2018-07-27 DIAGNOSIS — K219 Gastro-esophageal reflux disease without esophagitis: Secondary | ICD-10-CM | POA: Diagnosis not present

## 2018-07-27 DIAGNOSIS — Z1159 Encounter for screening for other viral diseases: Secondary | ICD-10-CM | POA: Diagnosis not present

## 2018-07-27 DIAGNOSIS — Z01818 Encounter for other preprocedural examination: Secondary | ICD-10-CM | POA: Diagnosis not present

## 2018-07-27 NOTE — Anesthesia Preprocedure Evaluation (Addendum)
Anesthesia Evaluation  Patient identified by MRN, date of birth, ID band Patient awake    Reviewed: Allergy & Precautions, NPO status , Patient's Chart, lab work & pertinent test results  History of Anesthesia Complications (+) PONV  Airway Mallampati: I  TM Distance: >3 FB Neck ROM: Full    Dental   Pulmonary former smoker,    Pulmonary exam normal        Cardiovascular hypertension, Pt. on medications Normal cardiovascular exam     Neuro/Psych Depression    GI/Hepatic GERD  Medicated and Controlled,  Endo/Other    Renal/GU      Musculoskeletal   Abdominal   Peds  Hematology   Anesthesia Other Findings   Reproductive/Obstetrics                            Anesthesia Physical Anesthesia Plan  ASA: II  Anesthesia Plan: General   Post-op Pain Management:    Induction: Intravenous  PONV Risk Score and Plan: 4 or greater and Scopolamine patch - Pre-op, Dexamethasone, Ondansetron and Midazolam  Airway Management Planned: LMA  Additional Equipment:   Intra-op Plan:   Post-operative Plan: Extubation in OR  Informed Consent: I have reviewed the patients History and Physical, chart, labs and discussed the procedure including the risks, benefits and alternatives for the proposed anesthesia with the patient or authorized representative who has indicated his/her understanding and acceptance.       Plan Discussed with: CRNA and Surgeon  Anesthesia Plan Comments: (See PAT note 07/26/2018, Konrad Felix, PA-C)       Anesthesia Quick Evaluation

## 2018-07-27 NOTE — Progress Notes (Signed)
Anesthesia Chart Review   Case: [id number] Date/Time: 07/31/18 1245   Procedure: CYSTOSCOPY/URETEROSCOPY/HOLMIUM LASER/STENT PLACEMENT (Left )   Anesthesia type: General   Pre-op diagnosis: LEFT URETERAL STONE   Location: [facility] ROOM 07 / [facility]   Surgeon: Lucas Mallow, MD      DISCUSSION: 68 yo former smoker (quit 10/09/12) with h/o PONV, GERD, asthma, HLD, CAD, thoracic AAA (CT 8/29 4.3 cm with 1 year follow up recommended, left ureteral stone scheduled for above procedure 07/31/18 with Dr. Link Snuffer.   S/p cystoscopy and stent placement 01/11/18 with no anesthesia complications noted.   Pt can proceed with planned procedure barring acute status change.  VS: BP (!) 134/94   Pulse 78   Temp 37.2 C (Oral)   Resp 18   Ht 4' 10.75" (1.492 m)   Wt 78.9 kg   SpO2 97%   BMI 35.44 kg/m   PROVIDERS: Terald Sleeper, PA-C is PCP last seen 05/09/2018   LABS: Labs reviewed: Acceptable for surgery. (all labs ordered are listed, but only abnormal results are displayed)  Labs Reviewed  BASIC METABOLIC PANEL - Abnormal; Notable for the following components:      Result Value   BUN 24 (*)    Creatinine, Ser 1.32 (*)    GFR calc non Af Amer 42 (*)    GFR calc Af Amer 48 (*)    All other components within normal limits  CBC     IMAGES:   EKG: 07/26/2018 Rate 72 bpm Normal sinus rhythm  Possible Anterior infarct, age undetermined  CV:  Past Medical History:  Diagnosis Date  . Coronary artery calcification seen on CAT scan 09/28/2017  . Full dentures   . GERD (gastroesophageal reflux disease)   . Heart murmur    found a couple of years ago no problems  . History of kidney stones   . History of recurrent UTIs   . Hyperlipidemia   . Left-sided Bell's palsy 1990s   residual left facial drooping  . Mild intermittent asthma    followed by pcp  . Numbness and tingling in left arm    residual from cervical neck surgery going from shoulder down to hand, not completely  . OA  (osteoarthritis)    hands, spine, hips, right leg  . Peripheral neuropathy    feet  . PONV (postoperative nausea and vomiting)   . Thoracic ascending aortic aneurysm ([place])    CT 09-28-2017--- 4.3 cm  . Thyroid nodule    ultrasound w/ bx 09/ 2019,  per pt was benign  . White coat syndrome without hypertension     Past Surgical History:  Procedure Laterality Date  . ANTERIOR CERVICAL DECOMP/DISCECTOMY FUSION  1980s  . CARPAL TUNNEL RELEASE Bilateral 2000  . CESAREAN SECTION  x2 last one 14  . CYSTOSCOPY WITH RETROGRADE PYELOGRAM, URETEROSCOPY AND STENT PLACEMENT Left 01/11/2018   Procedure: CYSTOSCOPY, URETEROSCOPY AND STENT PLACEMENT;  Surgeon: Lucas Mallow, MD;  Location: [facility];  Service: Urology;  Laterality: Left;  . EXTRACORPOREAL SHOCK WAVE LITHOTRIPSY Left 12/05/2017   Procedure: LEFT EXTRACORPOREAL SHOCK WAVE LITHOTRIPSY (ESWL);  Surgeon: Franchot Gallo, MD;  Location: [facility];  Service: Urology;  Laterality: Left;  . EXTRACORPOREAL SHOCK WAVE LITHOTRIPSY  1990s  . HOLMIUM LASER APPLICATION Left 29/92/4268   Procedure: HOLMIUM LASER APPLICATION;  Surgeon: Lucas Mallow, MD;  Location: [facility];  Service: Urology;  Laterality: Left;  . MOUTH SURGERY     all teeth  pulled    MEDICATIONS: . acetaminophen (TYLENOL) 325 MG tablet  . albuterol (PROVENTIL HFA;VENTOLIN HFA) 108 (90 Base) MCG/ACT inhaler  . budesonide-formoterol (SYMBICORT) 80-4.5 MCG/ACT inhaler  . cetirizine (ZYRTEC) 10 MG tablet  . citalopram (CELEXA) 40 MG tablet  . cyclobenzaprine (FLEXERIL) 10 MG tablet  . fluticasone (FLONASE) 50 MCG/ACT nasal spray  . gabapentin (NEURONTIN) 300 MG capsule  . ibuprofen (ADVIL) 200 MG tablet  . Melatonin 1 MG TABS  . metoprolol tartrate (LOPRESSOR) 25 MG tablet  . pravastatin (PRAVACHOL) 80 MG tablet  . ranitidine (ZANTAC) 150 MG tablet   No current facility-administered medications for this encounter.     Maia Plan [facility]  Testing [phone] 07/27/18 10:17 AM

## 2018-07-28 ENCOUNTER — Telehealth: Payer: Self-pay | Admitting: Physician Assistant

## 2018-07-28 ENCOUNTER — Other Ambulatory Visit: Payer: Self-pay | Admitting: Physician Assistant

## 2018-07-28 LAB — NOVEL CORONAVIRUS, NAA (HOSP ORDER, SEND-OUT TO REF LAB; TAT 18-24 HRS): SARS-CoV-2, NAA: NOT DETECTED

## 2018-07-28 MED ORDER — FAMOTIDINE 40 MG PO TABS: 40.0000 mg | ORAL_TABLET | Freq: Every day | ORAL | 11 refills | Status: DC

## 2018-07-28 NOTE — Progress Notes (Signed)
Called and left message on prescreening COVID call.

## 2018-07-28 NOTE — Telephone Encounter (Signed)
Patient aware and verbalizes understanding. 

## 2018-07-28 NOTE — Telephone Encounter (Signed)
pepcid is sent to pharmacy

## 2018-07-28 NOTE — Telephone Encounter (Signed)
Patient needs something sent to pharmacy in place of her ranitidine- please advise

## 2018-07-31 ENCOUNTER — Other Ambulatory Visit: Payer: Self-pay

## 2018-07-31 ENCOUNTER — Ambulatory Visit (HOSPITAL_COMMUNITY): Payer: Medicare Other | Admitting: Anesthesiology

## 2018-07-31 ENCOUNTER — Encounter (HOSPITAL_COMMUNITY)
Admission: RE | Disposition: A | Discharge status: Home / Self Care | Payer: Self-pay | Source: Other Acute Inpatient Hospital | Attending: Urology

## 2018-07-31 ENCOUNTER — Encounter (HOSPITAL_COMMUNITY): Payer: Self-pay | Admitting: Anesthesiology

## 2018-07-31 ENCOUNTER — Ambulatory Visit (HOSPITAL_COMMUNITY): Payer: Medicare Other

## 2018-07-31 ENCOUNTER — Ambulatory Visit (HOSPITAL_COMMUNITY): Payer: Medicare Other | Admitting: Physician Assistant

## 2018-07-31 ENCOUNTER — Ambulatory Visit (HOSPITAL_COMMUNITY)
Admission: RE | Admit: 2018-07-31 | Discharge: 2018-07-31 | Disposition: A | Discharge status: Home / Self Care | Payer: Medicare Other | Source: Other Acute Inpatient Hospital | Attending: Urology | Admitting: Urology

## 2018-07-31 DIAGNOSIS — I10 Essential (primary) hypertension: Secondary | ICD-10-CM | POA: Diagnosis not present

## 2018-07-31 DIAGNOSIS — F329 Major depressive disorder, single episode, unspecified: Secondary | ICD-10-CM | POA: Insufficient documentation

## 2018-07-31 DIAGNOSIS — Z87442 Personal history of urinary calculi: Secondary | ICD-10-CM | POA: Diagnosis not present

## 2018-07-31 DIAGNOSIS — E785 Hyperlipidemia, unspecified: Secondary | ICD-10-CM | POA: Diagnosis not present

## 2018-07-31 DIAGNOSIS — Z880 Allergy status to penicillin: Secondary | ICD-10-CM | POA: Insufficient documentation

## 2018-07-31 DIAGNOSIS — K219 Gastro-esophageal reflux disease without esophagitis: Secondary | ICD-10-CM | POA: Diagnosis not present

## 2018-07-31 DIAGNOSIS — N201 Calculus of ureter: Secondary | ICD-10-CM | POA: Diagnosis not present

## 2018-07-31 DIAGNOSIS — Z96651 Presence of right artificial knee joint: Secondary | ICD-10-CM | POA: Diagnosis not present

## 2018-07-31 DIAGNOSIS — N132 Hydronephrosis with renal and ureteral calculous obstruction: Secondary | ICD-10-CM | POA: Diagnosis not present

## 2018-07-31 DIAGNOSIS — Z87891 Personal history of nicotine dependence: Secondary | ICD-10-CM | POA: Insufficient documentation

## 2018-07-31 DIAGNOSIS — E7800 Pure hypercholesterolemia, unspecified: Secondary | ICD-10-CM | POA: Diagnosis not present

## 2018-07-31 DIAGNOSIS — J45909 Unspecified asthma, uncomplicated: Secondary | ICD-10-CM | POA: Diagnosis not present

## 2018-07-31 DIAGNOSIS — Z79899 Other long term (current) drug therapy: Secondary | ICD-10-CM | POA: Insufficient documentation

## 2018-07-31 DIAGNOSIS — F419 Anxiety disorder, unspecified: Secondary | ICD-10-CM | POA: Insufficient documentation

## 2018-07-31 HISTORY — PX: CYSTOSCOPY/URETEROSCOPY/HOLMIUM LASER/STENT PLACEMENT: SHX6546

## 2018-07-31 SURGERY — CYSTOSCOPY/URETEROSCOPY/HOLMIUM LASER/STENT PLACEMENT
Anesthesia: General | Laterality: Left

## 2018-07-31 MED ORDER — MIDAZOLAM HCL 2 MG/2ML IJ SOLN
INTRAMUSCULAR | Status: AC
  Filled 2018-07-31: qty 2

## 2018-07-31 MED ORDER — SODIUM CHLORIDE 0.9 % IR SOLN
Status: DC | PRN
  Administered 2018-07-31: 3000 mL via INTRAVESICAL

## 2018-07-31 MED ORDER — FENTANYL CITRATE (PF) 100 MCG/2ML IJ SOLN
INTRAMUSCULAR | Status: AC
  Filled 2018-07-31: qty 2

## 2018-07-31 MED ORDER — SCOPOLAMINE 1 MG/3DAYS TD PT72
MEDICATED_PATCH | TRANSDERMAL | Status: DC | PRN
  Administered 2018-07-31: 1 via TRANSDERMAL

## 2018-07-31 MED ORDER — HYDROCODONE-ACETAMINOPHEN 5-325 MG PO TABS: 1.0000 | ORAL_TABLET | ORAL | 0 refills | Status: DC | PRN

## 2018-07-31 MED ORDER — TAMSULOSIN HCL 0.4 MG PO CAPS: 0.4000 mg | ORAL_CAPSULE | Freq: Every day | ORAL | 3 refills | Status: DC

## 2018-07-31 MED ORDER — FENTANYL CITRATE (PF) 100 MCG/2ML IJ SOLN
INTRAMUSCULAR | Status: DC | PRN
  Administered 2018-07-31: 50 ug via INTRAVENOUS
  Administered 2018-07-31 (×2): 25 ug via INTRAVENOUS

## 2018-07-31 MED ORDER — PHENYLEPHRINE HCL (PRESSORS) 10 MG/ML IV SOLN
INTRAVENOUS | Status: DC | PRN
  Administered 2018-07-31 (×3): 80 ug via INTRAVENOUS

## 2018-07-31 MED ORDER — ONDANSETRON HCL 4 MG/2ML IJ SOLN
INTRAMUSCULAR | Status: AC
  Filled 2018-07-31: qty 4

## 2018-07-31 MED ORDER — PHENYLEPHRINE 40 MCG/ML (10ML) SYRINGE FOR IV PUSH (FOR BLOOD PRESSURE SUPPORT)
PREFILLED_SYRINGE | INTRAVENOUS | Status: AC
  Filled 2018-07-31: qty 10

## 2018-07-31 MED ORDER — ONDANSETRON HCL 4 MG/2ML IJ SOLN
INTRAMUSCULAR | Status: DC | PRN
  Administered 2018-07-31: 4 mg via INTRAVENOUS

## 2018-07-31 MED ORDER — LIDOCAINE HCL (CARDIAC) PF 100 MG/5ML IV SOSY
PREFILLED_SYRINGE | INTRAVENOUS | Status: DC | PRN
  Administered 2018-07-31: 80 mg via INTRAVENOUS

## 2018-07-31 MED ORDER — LACTATED RINGERS IV SOLN
INTRAVENOUS | Status: DC | PRN
  Administered 2018-07-31: 12:00:00 via INTRAVENOUS
  Administered 2018-07-31: 1000 mL

## 2018-07-31 MED ORDER — SCOPOLAMINE 1 MG/3DAYS TD PT72
MEDICATED_PATCH | TRANSDERMAL | Status: AC
  Filled 2018-07-31: qty 1

## 2018-07-31 MED ORDER — IOHEXOL 300 MG/ML  SOLN
INTRAMUSCULAR | Status: DC | PRN
  Administered 2018-07-31: 10 mL via URETHRAL

## 2018-07-31 MED ORDER — PROPOFOL 10 MG/ML IV BOLUS
INTRAVENOUS | Status: DC | PRN
  Administered 2018-07-31: 150 mg via INTRAVENOUS

## 2018-07-31 MED ORDER — DEXAMETHASONE SODIUM PHOSPHATE 10 MG/ML IJ SOLN
INTRAMUSCULAR | Status: AC
  Filled 2018-07-31: qty 2

## 2018-07-31 MED ORDER — LIDOCAINE 2% (20 MG/ML) 5 ML SYRINGE
INTRAMUSCULAR | Status: AC
  Filled 2018-07-31: qty 15

## 2018-07-31 MED ORDER — CIPROFLOXACIN IN D5W 400 MG/200ML IV SOLN
400.0000 mg | INTRAVENOUS | Status: AC
  Administered 2018-07-31: 400 mg via INTRAVENOUS
  Filled 2018-07-31: qty 200

## 2018-07-31 MED ORDER — DEXAMETHASONE SODIUM PHOSPHATE 4 MG/ML IJ SOLN
INTRAMUSCULAR | Status: DC | PRN
  Administered 2018-07-31: 10 mg via INTRAVENOUS

## 2018-07-31 MED ORDER — MIDAZOLAM HCL 5 MG/5ML IJ SOLN
INTRAMUSCULAR | Status: DC | PRN
  Administered 2018-07-31: 2 mg via INTRAVENOUS

## 2018-07-31 SURGICAL SUPPLY — 26 items
BAG URO CATCHER STRL LF (MISCELLANEOUS) ×3 IMPLANT
BASKET LASER NITINOL 1.9FR (BASKET) IMPLANT
BASKET ZERO TIP NITINOL 2.4FR (BASKET) IMPLANT
CATH INTERMIT  6FR 70CM (CATHETERS) ×3 IMPLANT
CATH URET 5FR 28IN CONE TIP (BALLOONS)
CATH URET 5FR 70CM CONE TIP (BALLOONS) IMPLANT
CLOTH BEACON ORANGE TIMEOUT ST (SAFETY) ×3 IMPLANT
COVER WAND RF STERILE (DRAPES) IMPLANT
EXTRACTOR STONE 1.7FRX115CM (UROLOGICAL SUPPLIES) ×3 IMPLANT
FIBER LASER FLEXIVA 365 (UROLOGICAL SUPPLIES) IMPLANT
FIBER LASER TRAC TIP (UROLOGICAL SUPPLIES) ×3 IMPLANT
GLOVE BIO SURGEON STRL SZ7.5 (GLOVE) ×3 IMPLANT
GLOVE BIOGEL PI IND STRL 7.0 (GLOVE) ×1 IMPLANT
GLOVE BIOGEL PI INDICATOR 7.0 (GLOVE) ×2
GOWN STRL REUS W/ TWL LRG LVL3 (GOWN DISPOSABLE) ×1 IMPLANT
GOWN STRL REUS W/TWL LRG LVL3 (GOWN DISPOSABLE) ×2
GOWN STRL REUS W/TWL XL LVL3 (GOWN DISPOSABLE) ×3 IMPLANT
GUIDEWIRE ANG ZIPWIRE 038X150 (WIRE) IMPLANT
GUIDEWIRE STR DUAL SENSOR (WIRE) ×6 IMPLANT
KIT TURNOVER KIT A (KITS) ×3 IMPLANT
MANIFOLD NEPTUNE II (INSTRUMENTS) ×3 IMPLANT
PACK CYSTO (CUSTOM PROCEDURE TRAY) ×3 IMPLANT
SHEATH URETERAL 12FRX28CM (UROLOGICAL SUPPLIES) IMPLANT
SHEATH URETERAL 12FRX35CM (MISCELLANEOUS) ×3 IMPLANT
STENT URET 6FRX22 CONTOUR (STENTS) ×3 IMPLANT
TUBING UROLOGY SET (TUBING) ×3 IMPLANT

## 2018-07-31 NOTE — Transfer of Care (Signed)
Immediate Anesthesia Transfer of Care Note  Patient: Joyce Ross  Procedure(s) Performed: Procedure(s): CYSTOSCOPY/URETEROSCOPY/HOLMIUM LASER/STENT PLACEMENT (Left)  Patient Location: PACU  Anesthesia Type:General  Level of Consciousness:  sedated, patient cooperative and responds to stimulation  Airway & Oxygen Therapy:Patient Spontanous Breathing and Patient connected to face mask oxgen  Post-op Assessment:  Report given to PACU RN and Post -op Vital signs reviewed and stable  Post vital signs:  Reviewed and stable  Last Vitals:  Vitals:   07/31/18 1108  BP: (!) 158/83  Pulse: 90  Resp: 18  Temp: 36.8 C  SpO2: 19%    Complications: No apparent anesthesia complications

## 2018-07-31 NOTE — Anesthesia Procedure Notes (Signed)
Procedure Name: LMA Insertion Date/Time: 07/31/2018 1:00 PM Performed by: Lavina Hamman, CRNA Pre-anesthesia Checklist: Patient identified, Emergency Drugs available, Suction available and Patient being monitored Patient Re-evaluated:Patient Re-evaluated prior to induction Oxygen Delivery Method: Circle System Utilized Preoxygenation: Pre-oxygenation with 100% oxygen Induction Type: IV induction Ventilation: Mask ventilation without difficulty LMA: LMA inserted LMA Size: 3.0 Number of attempts: 1 Airway Equipment and Method: Bite block Placement Confirmation: positive ETCO2 Tube secured with: Tape Dental Injury: Teeth and Oropharynx as per pre-operative assessment

## 2018-07-31 NOTE — Anesthesia Postprocedure Evaluation (Signed)
Anesthesia Post Note  Patient: Joyce Ross  Procedure(s) Performed: CYSTOSCOPY/URETEROSCOPY/HOLMIUM LASER/STENT PLACEMENT (Left )     Patient location during evaluation: PACU Anesthesia Type: General Level of consciousness: awake and alert Pain management: pain level controlled Vital Signs Assessment: post-procedure vital signs reviewed and stable Respiratory status: spontaneous breathing, nonlabored ventilation, respiratory function stable and patient connected to nasal cannula oxygen Cardiovascular status: blood pressure returned to baseline and stable Postop Assessment: no apparent nausea or vomiting Anesthetic complications: no    Last Vitals:  Vitals:   07/31/18 1415 07/31/18 1426  BP: (!) 157/67 (!) 150/70  Pulse: 84 85  Resp: 14 12  Temp: 36.8 C 36.8 C  SpO2: 96% 93%    Last Pain:  Vitals:   07/31/18 1426  TempSrc:   PainSc: 0-No pain                 Veronika Heard DAVID

## 2018-07-31 NOTE — Op Note (Signed)
Operative Note  Preoperative diagnosis:  1.  Left ureteral calculi  Postoperative diagnosis: 1.  Left renal and ureteral calculi  Procedure(s): 1.  Cystoscopy, left retrograde pyelogram, left ureteroscopy with laser lithotripsy, a stone basketing, ureteral stent placement  Surgeon: Link Snuffer, MD  Assistants: None  Anesthesia: General  Complications: None immediate  EBL: Minimal  Specimens: 1.  None  Drains/Catheters: 1.  6 x 22 double-J ureteral stent  Intraoperative findings: 1.  Normal urethra and bladder 2.  Multiple small distal left ureteral calculi fragmented and then basket extracted.  Stone in the left lower pole was basket extracted.  Retrograde pyelogram revealed hydronephrosis.  Indication: 68 year old female with a history of left renal and ureteral calculi with persistent hydronephrosis found again to have more ureteral calculi presents for intervention.  Description of procedure:  The patient was identified and consent was obtained.  The patient was taken to the operating room and placed in the supine position.  The patient was placed under general anesthesia.  Perioperative antibiotics were administered.  The patient was placed in dorsal lithotomy.  Patient was prepped and draped in a standard sterile fashion and a timeout was performed.  A 21 French rigid cystoscope was advanced into the urethra and into the bladder.  Complete cystoscopy was performed with findings noted above.  The left ureter was cannulated with a sensor wire which was advanced up to the kidney under fluoroscopic guidance.  A semirigid ureteroscope was advanced alongside the wire up to the stone of interest.  Larger stones were fragmented with laser fiber followed by basket extraction of all small stone fragments.  I reinspected the ureter up to the renal pelvis and did not see any clinically significant stone fragments.  There was only a tiny calculus that was too small to even basket.  I then  shot a retrograde pyelogram through the scope with the findings noted above.  I then advanced a second sensor wire through the scope and into the kidney followed by removal of the scope visualizing the ureter upon removal.  I then advanced a 12 x 14 ureteral access sheath over 1 of the wires keeping the other wire as a safety wire.  I advanced the sheath under continuous fluoroscopic guidance with minimal resistance.  A flexible ureteroscope was used to access the kidney and a basket extracted one remaining stone fragment within the kidney.  No other stones were seen within the kidney.  I therefore withdrew the scope along with the access sheath visualizing the entire ureter upon removal.  There was no injury to the ureter identified.  There were no clinically significant ureteral calculi.  I then backloaded the wire onto a rigid cystoscope which was advanced into the bladder followed by routine placement of a 6 x 22 double-J ureteral stent followed by removal of the wire.  Fluoroscopy confirmed proximal placement and direct visualization confirmed a good coil within the bladder.  I drained the bladder withdrew the scope.  This concluded the operation.  Patient tolerated procedure well and was stable postoperatively.  Plan: Return in about 1 week for stent removal.

## 2018-07-31 NOTE — Discharge Instructions (Signed)

## 2018-07-31 NOTE — H&P (Signed)
CC: I have kidney stones.  HPI: Joyce Ross is a 68 year-old female established patient who is here for renal calculi.  The problem is on the left side. She first stated noticing pain on 09/28/2017. This is not her first kidney stone. She has had more than 5 stones prior to getting this one. She is not currently having flank pain, back pain, groin pain, nausea, vomiting, fever or chills. She has not caught a stone in her urine strainer since her symptoms began.   She has had eswl, ureteral stent, and ureteroscopy for treatment of her stones in the past.   11/22/17: Patient has a history of nephrolithiasis. She has required ESWL as well as ureteroscopy in the past. She has been getting stones since the 1970s. She underwent a CT of the chest on 09/28/2017. This incidentally revealed left-sided hydronephrosis. She subsequently underwent a renal ultrasound and then a CT of the abdomen and pelvis on 11/07/2017. CT revealed a 17 mm obstructing left proximal ureteral calculus with upstream hydronephrosis. The kidney looks mildly atrophic possibly secondary to this. There are several other nonobstructing calculi. She currently is asymptomatic. She has no complaints in regards to this.   12/19/17: She returns today for follow up. She is s/p left ESWL for above noted left proximal ureteral calculus on 10/21. Today she states that overall she is doing well following her procedure. She states that she has passed multiple stone fragments in the interim. She denies any current flank pain or abdominal pain. She denies difficulties voiding or exacerbation of voiding symptoms. She denies dysuria, gross hematuria, fever, chills, nausea, or vomiting.   01/18/18: She returns today for follow up and left ureteral stent removal. She is s/p left URS for removal of residual ureteral fragments on 11/27. Stone composition was noted to be calcium oxalate. Per operative note, she had somewhat of an atrophic appearing kidney on the left  side indicating that the stone may be long-standing and may have been obstructing for some time. Per her urologist note, she may benefit from a Lasix renogram to rule out non functional prior to considering further surgical intervention for lower pole calculi. Today she states that she has been doing well since her procedure. She had some pain and nausea on POD #1, but since then symptoms have improved. She denies any current flank pain or abdominal pain. She does have some dysuria and intermittent hematuria. However, she denies difficulties voiding or exacerbation of lower urinary tract symptoms. She denies fevers, chills, nausea, or vomiting.   02/23/2018  Patient presents for follow-up. She has been doing well with no pain. However, today's renal ultrasound revealed persistent severe left-sided hydronephrosis with cortical thinning. Her prior CT scan before intervention for the stone also showed an atrophic left kidney with hydronephrosis down to the level of the stone. However, there is minimal improvement in hydronephrosis despite lithotripsy of the stone. She had her stent removed about a month ago.   03/09/2018  Patient underwent a repeat CT scan. This revealed bilateral nephrolithiasis. Unfortunately, she had persistent stone in the distal left ureter. She also underwent a Lasix renogram to evaluate renal function prior to proceeding with definitive management of the stones. This revealed normal right renal function. It did confirm decreased renal function on the left at 17%. She has had no pain. Urinalysis is positive for moderate bacteria and WBCs but she is asymptomatic.   04/20/2018  Patient status post left ureteroscopy with laser lithotripsy and ureteral stent placement. She  presents today for stent removal.   06/14/2018  Patient presents for follow-up. She underwent a renal ultrasound that revealed moderate hydronephrosis on the left. KUB has a radiopaque area in the lower pelvis on the left  phlebolith versus stone. She does have a low-grade fever of 100.8. She however denies any associated symptoms including hematuria or dysuria. She denies flank pain. She has a chronic cough but it is no worse today than it has been before. She states she does not feel sick at all.   06/30/2018  Patient presents after undergoing a CT scan. This revealed multiple distal left ureteral calculi. She also had 1 left renal calculus in the lower pole. Prior scan in January showed multiple left-sided renal calculi. There was mild-to-moderate hydronephrosis. She is asymptomatic.     ALLERGIES: Penicillin    MEDICATIONS: Cetirizine Hcl 10 mg tablet  Cyclobenzaprine Hcl 10 mg tablet  Gabapentin 300 mg capsule  Ibuprofen  Pravachol 80 mg tablet  Ranitidine Hcl 150 mg capsule     GU PSH: Cysto Remove Stent FB Sim - 04/20/2018, 01/18/2018 ESWL, Left - 12/05/2017 Ureteroscopic laser litho, Left - 04/12/2018, Left - 01/11/2018    NON-GU PSH: Carpal tunnel surgery, Bilateral Knee replacement, Right    GU PMH: Hydronephrosis - 02/23/2018 Renal calculus (Stable) - 02/23/2018 Ureteral calculus - 01/18/2018, - 12/19/2017, - 11/22/2017 Ureteral obstruction secondary to calculous - 11/22/2017    NON-GU PMH: Anxiety Arthritis Asthma Depression GERD Hypercholesterolemia    FAMILY HISTORY: 1 Daughter - Other 1 son - Other Diabetes - Brother Heart Disease - Mother Myocardial Infarction - Father   SOCIAL HISTORY: Marital Status: Widowed Preferred Language: English; Race: White Current Smoking Status: Patient does not smoke anymore. Has not smoked since 11/15/2012.   Tobacco Use Assessment Completed: Used Tobacco in last 30 days? Drinks 2 caffeinated drinks per day.    REVIEW OF SYSTEMS:    GU Review Female:   Patient denies frequent urination, hard to postpone urination, burning /pain with urination, get up at night to urinate, leakage of urine, stream starts and stops, trouble starting your stream, have  to strain to urinate, and being pregnant.  Gastrointestinal (Upper):   Patient denies nausea, vomiting, and indigestion/ heartburn.  Gastrointestinal (Lower):   Patient denies diarrhea and constipation.  Constitutional:   Patient denies fever, night sweats, weight loss, and fatigue.  Skin:   Patient denies skin rash/ lesion and itching.  Eyes:   Patient denies blurred vision and double vision.  Ears/ Nose/ Throat:   Patient denies sore throat and sinus problems.  Hematologic/Lymphatic:   Patient denies swollen glands and easy bruising.  Cardiovascular:   Patient denies leg swelling and chest pains.  Respiratory:   Patient denies cough and shortness of breath.  Endocrine:   Patient denies excessive thirst.  Musculoskeletal:   Patient denies back pain and joint pain.  Neurological:   Patient denies headaches and dizziness.  Psychologic:   Patient denies depression and anxiety.   VITAL SIGNS:      06/30/2018 02:20 PM  BP 160/73 mmHg  Heart Rate 82 /min  Temperature 98.5 F / 36.9 C   MULTI-SYSTEM PHYSICAL EXAMINATION:    Constitutional: Well-nourished. No physical deformities. Normally developed. Good grooming.  Respiratory: No labored breathing, no use of accessory muscles.   Cardiovascular: Normal temperature, adequate perfusion of extremities  Skin: No paleness, no jaundice  Neurologic / Psychiatric: Oriented to time, oriented to place, oriented to person. No depression, no anxiety, no agitation.  Gastrointestinal:  No mass, no tenderness, no rigidity, obese abdomen.   Eyes: Normal conjunctivae. Normal eyelids.  Musculoskeletal: Normal gait and station of head and neck.     PAST DATA REVIEWED:  Source Of History:  Patient  X-Ray Review: C.T. Abdomen/Pelvis: Reviewed Films. Reviewed Report. Discussed With Patient.     PROCEDURES:          Urinalysis w/Scope Dipstick Dipstick Cont'd Micro  Color: Yellow Bilirubin: Neg mg/dL WBC/hpf: 6 - 10/hpf  Appearance: Clear Ketones: Neg  mg/dL RBC/hpf: NS (Not Seen)  Specific Gravity: 1.025 Blood: Neg ery/uL Bacteria: NS (Not Seen)  pH: <=5.0 Protein: Neg mg/dL Cystals: NS (Not Seen)  Glucose: Neg mg/dL Urobilinogen: 0.2 mg/dL Casts: NS (Not Seen)    Nitrites: Neg Trichomonas: Not Present    Leukocyte Esterase: 1+ leu/uL Mucous: Not Present      Epithelial Cells: NS (Not Seen)      Yeast: NS (Not Seen)      Sperm: Not Present    ASSESSMENT:      ICD-10 Details  1 GU:   Renal and ureteral calculus - N20.2   2   Ureteral obstruction secondary to calculous - N13.2 Stable   PLAN:           Document Letter(s):  Created for Patient: Clinical Summary         Notes:   proceed with left ureteroscopy with laser lithotripsy and ureteral stent placement   Signed by Link Snuffer, III, M.D. on 06/30/18 at 3:00 PM (EDT

## 2018-08-01 ENCOUNTER — Encounter (HOSPITAL_COMMUNITY): Payer: Self-pay | Admitting: Urology

## 2018-08-11 ENCOUNTER — Encounter (HOSPITAL_COMMUNITY): Payer: Self-pay | Admitting: Urology

## 2018-08-11 DIAGNOSIS — N130 Hydronephrosis with ureteropelvic junction obstruction: Secondary | ICD-10-CM | POA: Diagnosis not present

## 2018-08-11 DIAGNOSIS — N202 Calculus of kidney with calculus of ureter: Secondary | ICD-10-CM | POA: Diagnosis not present

## 2018-08-11 NOTE — Addendum Note (Signed)
Addendum  created 08/11/18 1013 by Lillia Abed, MD   Intraprocedure Event edited, Intraprocedure Staff edited

## 2018-09-05 ENCOUNTER — Other Ambulatory Visit: Payer: Self-pay | Admitting: *Deleted

## 2018-09-05 DIAGNOSIS — G629 Polyneuropathy, unspecified: Secondary | ICD-10-CM

## 2018-09-05 DIAGNOSIS — E785 Hyperlipidemia, unspecified: Secondary | ICD-10-CM

## 2018-09-05 MED ORDER — PRAVASTATIN SODIUM 80 MG PO TABS: 80.0000 mg | ORAL_TABLET | Freq: Every day | ORAL | 0 refills | Status: DC

## 2018-09-05 MED ORDER — GABAPENTIN 300 MG PO CAPS: ORAL_CAPSULE | ORAL | 0 refills | Status: DC

## 2018-09-08 ENCOUNTER — Telehealth: Payer: Self-pay | Admitting: Physician Assistant

## 2018-09-08 ENCOUNTER — Encounter: Payer: Self-pay | Admitting: Physician Assistant

## 2018-09-08 ENCOUNTER — Ambulatory Visit (INDEPENDENT_AMBULATORY_CARE_PROVIDER_SITE_OTHER): Payer: Medicare Other | Admitting: Physician Assistant

## 2018-09-08 ENCOUNTER — Other Ambulatory Visit: Payer: Self-pay

## 2018-09-08 DIAGNOSIS — Z0000 Encounter for general adult medical examination without abnormal findings: Secondary | ICD-10-CM

## 2018-09-08 DIAGNOSIS — I2510 Atherosclerotic heart disease of native coronary artery without angina pectoris: Secondary | ICD-10-CM

## 2018-09-08 DIAGNOSIS — G629 Polyneuropathy, unspecified: Secondary | ICD-10-CM

## 2018-09-08 DIAGNOSIS — Z0001 Encounter for general adult medical examination with abnormal findings: Secondary | ICD-10-CM | POA: Diagnosis not present

## 2018-09-08 DIAGNOSIS — J309 Allergic rhinitis, unspecified: Secondary | ICD-10-CM

## 2018-09-08 DIAGNOSIS — F339 Major depressive disorder, recurrent, unspecified: Secondary | ICD-10-CM | POA: Diagnosis not present

## 2018-09-08 DIAGNOSIS — E785 Hyperlipidemia, unspecified: Secondary | ICD-10-CM

## 2018-09-08 MED ORDER — PRAVASTATIN SODIUM 80 MG PO TABS: 80.0000 mg | ORAL_TABLET | Freq: Every day | ORAL | 3 refills | Status: DC

## 2018-09-08 MED ORDER — CITALOPRAM HYDROBROMIDE 40 MG PO TABS: 40.0000 mg | ORAL_TABLET | Freq: Every day | ORAL | 3 refills | Status: DC

## 2018-09-08 MED ORDER — OMEPRAZOLE 20 MG PO CPDR: 20.0000 mg | DELAYED_RELEASE_CAPSULE | Freq: Every day | ORAL | 3 refills | Status: DC

## 2018-09-08 MED ORDER — CETIRIZINE HCL 10 MG PO TABS: 10.0000 mg | ORAL_TABLET | Freq: Every day | ORAL | 3 refills | Status: DC

## 2018-09-08 MED ORDER — GABAPENTIN 300 MG PO CAPS: ORAL_CAPSULE | ORAL | 5 refills | Status: DC

## 2018-09-08 NOTE — Telephone Encounter (Signed)
Patient states she has spoken with Glenard Haring.

## 2018-09-08 NOTE — Progress Notes (Signed)
Telephone visit  Subjective: CC: Recheck on chronic conditions and medications PCP: Terald Sleeper, PA-C [id number] Joyce Ross is a 68 y.o. female calls for telephone consult today. Patient provides verbal consent for consult held via phone.  Patient is identified with 2 separate identifiers.  At this time the entire area is on COVID-19 social distancing and stay home orders are in place.  Patient is of higher risk and therefore we are performing this by a virtual method.  Location of patient: Home Location of provider: HOME Others present for call: No  This patient is having a 8-monthfollow-up on her chronic medical conditions.  At this time she states she is fairly stable and not having any difficulties.  She will come in for labs at a future date.  The order has been placed. The only issue that she has been dealing with is a kidney that has several stones, she had a procedure performed to try to remove them.  She states that this time she is healing well and not having any difficulties. She still continues with neuropathy in her feet.  She is continuing to take the gabapentin.  She does need refills on her medication for GERD, hyperlipidemia, depression, allergic rhinitis.  The patient has been placed on metoprolol to try to help with blood pressure a.m. heart rate.  However she had nightmares on the medication so we have added this to her allergy list.  ROS: Per HPI  Allergies  Allergen Reactions  . Adhesive [Tape] Other (See Comments)    "leaves red places on skin"  . Metoprolol     nightmares  . Penicillins Other (See Comments)    Thrush Has patient had a PCN reaction causing immediate rash, facial/tongue/throat swelling, SOB or lightheadedness with hypotension: No Has patient had a PCN reaction causing severe rash involving mucus membranes or skin necrosis: No Has patient had a PCN reaction that required hospitalization: No Has patient had a PCN reaction occurring within  the last 10 years: Yes If all of the above answers are "NO", then may proceed with Cephalosporin use.   . Radish [Raphanus Sativus] Hives  . Yellow Jacket Venom Itching and Rash   Past Medical History:  Diagnosis Date  . Coronary artery calcification seen on CAT scan 09/28/2017  . Dyspnea   . Full dentures   . GERD (gastroesophageal reflux disease)   . Heart murmur    found a couple of years ago no problems  . History of kidney stones   . History of recurrent UTIs   . Hyperlipidemia   . Left-sided Bell's palsy 1990s   residual left facial drooping  . Mild intermittent asthma    followed by pcp  . Numbness and tingling in left arm    residual from cervical neck surgery going from shoulder down to hand, not completely  . OA (osteoarthritis)    hands, spine, hips, right leg  . Peripheral neuropathy    feet  . PONV (postoperative nausea and vomiting)   . Thoracic ascending aortic aneurysm ([facility]    CT 09-28-2017--- 4.3 cm  . Thyroid nodule    ultrasound w/ bx 09/ 2019,  per pt was benign  . White coat syndrome without hypertension     Current Outpatient Medications:  .  acetaminophen (TYLENOL) 325 MG tablet, Take 325 mg by mouth every 6 (six) hours as needed for moderate pain. , Disp: , Rfl:  .  albuterol (PROVENTIL HFA;VENTOLIN HFA) 108 (90 Base) MCG/ACT  inhaler, Inhale 1-2 puffs into the lungs every 6 (six) hours as needed for wheezing or shortness of breath., Disp: , Rfl:  .  budesonide-formoterol (SYMBICORT) 80-4.5 MCG/ACT inhaler, Inhale 2 puffs into the lungs 2 (two) times daily. (Patient taking differently: Inhale 2 puffs into the lungs 2 (two) times daily. ), Disp: 1 Inhaler, Rfl: 12 .  cetirizine (ZYRTEC) 10 MG tablet, Take 1 tablet (10 mg total) by mouth daily., Disp: 90 tablet, Rfl: 3 .  citalopram (CELEXA) 40 MG tablet, Take 1 tablet (40 mg total) by mouth daily., Disp: 90 tablet, Rfl: 3 .  cyclobenzaprine (FLEXERIL) 10 MG tablet, Take 1 tablet (10 mg total) by mouth 3  (three) times daily as needed. for muscle spams, Disp: 60 tablet, Rfl: 2 .  fluticasone (FLONASE) 50 MCG/ACT nasal spray, Place 2 sprays into both nostrils daily., Disp: 16 g, Rfl: 6 .  gabapentin (NEURONTIN) 300 MG capsule, TAKE ONE CAPSULE BY MOUTH three TIMES A DAY, Disp: 90 capsule, Rfl: 5 .  HYDROcodone-acetaminophen (NORCO) 5-325 MG tablet, Take 1 tablet by mouth every 4 (four) hours as needed for moderate pain., Disp: 10 tablet, Rfl: 0 .  ibuprofen (ADVIL) 200 MG tablet, Take 400 mg by mouth every 6 (six) hours as needed., Disp: , Rfl:  .  Melatonin 1 MG TABS, Take 1 mg by mouth at bedtime as needed (sleep). , Disp: , Rfl:  .  omeprazole (PRILOSEC) 20 MG capsule, Take 1 capsule (20 mg total) by mouth daily., Disp: 90 capsule, Rfl: 3 .  pravastatin (PRAVACHOL) 80 MG tablet, Take 1 tablet (80 mg total) by mouth at bedtime., Disp: 90 tablet, Rfl: 3 .  tamsulosin (FLOMAX) 0.4 MG CAPS capsule, Take 1 capsule (0.4 mg total) by mouth daily., Disp: 10 capsule, Rfl: 3  Assessment/ Plan: 68 y.o. female   1. Depression, recurrent ([place]) - citalopram (CELEXA) 40 MG tablet; Take 1 tablet (40 mg total) by mouth daily.  Dispense: 90 tablet; Refill: 3  2. Hyperlipidemia, unspecified hyperlipidemia type - pravastatin (PRAVACHOL) 80 MG tablet; Take 1 tablet (80 mg total) by mouth at bedtime.  Dispense: 90 tablet; Refill: 3 - CMP14+EGFR; Future - CBC with Differential/Platelet; Future - Lipid panel; Future - TSH; Future - Bayer DCA Hb A1c Waived; Future  3. Coronary artery disease involving native heart without angina pectoris, unspecified vessel or lesion type - CMP14+EGFR; Future - CBC with Differential/Platelet; Future - Lipid panel; Future - TSH; Future - Bayer DCA Hb A1c Waived; Future  4. Neuropathy - gabapentin (NEURONTIN) 300 MG capsule; TAKE ONE CAPSULE BY MOUTH three TIMES A DAY  Dispense: 90 capsule; Refill: 5  5. Allergic rhinitis, unspecified seasonality, unspecified trigger -  cetirizine (ZYRTEC) 10 MG tablet; Take 1 tablet (10 mg total) by mouth daily.  Dispense: 90 tablet; Refill: 3  6. Well adult exam - CMP14+EGFR; Future - CBC with Differential/Platelet; Future - Lipid panel; Future - TSH; Future - Bayer DCA Hb A1c Waived; Future   Return in about 6 months (around 03/11/2019).  Continue all other maintenance medications as listed above.  Start time: 10:39 AM End time: 10:55 AM  Meds ordered this encounter  Medications  . citalopram (CELEXA) 40 MG tablet    Sig: Take 1 tablet (40 mg total) by mouth daily.    Dispense:  90 tablet    Refill:  3    Order Specific Question:   Supervising Provider    Answer:   Janora Norlander [[id number]]  . pravastatin (PRAVACHOL) 80  MG tablet    Sig: Take 1 tablet (80 mg total) by mouth at bedtime.    Dispense:  90 tablet    Refill:  3    Order Specific Question:   Supervising Provider    Answer:   Janora Norlander [[id number]]  . omeprazole (PRILOSEC) 20 MG capsule    Sig: Take 1 capsule (20 mg total) by mouth daily.    Dispense:  90 capsule    Refill:  3    Order Specific Question:   Supervising Provider    Answer:   Janora Norlander [[id number]]  . gabapentin (NEURONTIN) 300 MG capsule    Sig: TAKE ONE CAPSULE BY MOUTH three TIMES A DAY    Dispense:  90 capsule    Refill:  5    Order Specific Question:   Supervising Provider    Answer:   Janora Norlander [[id number]]  . cetirizine (ZYRTEC) 10 MG tablet    Sig: Take 1 tablet (10 mg total) by mouth daily.    Dispense:  90 tablet    Refill:  3    Order Specific Question:   Supervising Provider    Answer:   Janora Norlander [[id number]]    Joyce Nearing PA-C [place] [phone]

## 2018-09-12 ENCOUNTER — Other Ambulatory Visit: Payer: Self-pay | Admitting: Physician Assistant

## 2018-09-12 ENCOUNTER — Telehealth: Payer: Self-pay | Admitting: *Deleted

## 2018-09-12 MED ORDER — PANTOPRAZOLE SODIUM 20 MG PO TBEC: 20.0000 mg | DELAYED_RELEASE_TABLET | Freq: Every day | ORAL | 3 refills | Status: DC

## 2018-09-12 NOTE — Telephone Encounter (Signed)
Please discontinue omeprazole.  I have sent in pantoprazole, this will go along with the Celexa.

## 2018-09-12 NOTE — Telephone Encounter (Signed)
Tc from [facility] between Omeprazole & Celexa, increased heart issues Please advise

## 2018-09-12 NOTE — Telephone Encounter (Signed)
Patient aware and verbalizes understanding. 

## 2018-09-14 DIAGNOSIS — Z87442 Personal history of urinary calculi: Secondary | ICD-10-CM | POA: Diagnosis not present

## 2018-09-14 DIAGNOSIS — N130 Hydronephrosis with ureteropelvic junction obstruction: Secondary | ICD-10-CM | POA: Diagnosis not present

## 2018-10-27 ENCOUNTER — Ambulatory Visit (INDEPENDENT_AMBULATORY_CARE_PROVIDER_SITE_OTHER): Payer: Medicare Other | Admitting: *Deleted

## 2018-10-27 DIAGNOSIS — Z0000 Encounter for general adult medical examination without abnormal findings: Secondary | ICD-10-CM

## 2018-10-27 NOTE — Patient Instructions (Signed)
Preventive Care 68 Years and Older, Female Preventive care refers to lifestyle choices and visits with your health care provider that can promote health and wellness. This includes:  A yearly physical exam. This is also called an annual well check.  Regular dental and eye exams.  Immunizations.  Screening for certain conditions.  Healthy lifestyle choices, such as diet and exercise. What can I expect for my preventive care visit? Physical exam Your health care provider will check:  Height and weight. These may be used to calculate body mass index (BMI), which is a measurement that tells if you are at a healthy weight.  Heart rate and blood pressure.  Your skin for abnormal spots. Counseling Your health care provider may ask you questions about:  Alcohol, tobacco, and drug use.  Emotional well-being.  Home and relationship well-being.  Sexual activity.  Eating habits.  History of falls.  Memory and ability to understand (cognition).  Work and work Statistician.  Pregnancy and menstrual history. What immunizations do I need?  Influenza (flu) vaccine  This is recommended every year. Tetanus, diphtheria, and pertussis (Tdap) vaccine  You may need a Td booster every 10 years. Varicella (chickenpox) vaccine  You may need this vaccine if you have not already been vaccinated. Zoster (shingles) vaccine  You may need this after age 33. Pneumococcal conjugate (PCV13) vaccine  One dose is recommended after age 33. Pneumococcal polysaccharide (PPSV23) vaccine  One dose is recommended after age 72. Measles, mumps, and rubella (MMR) vaccine  You may need at least one dose of MMR if you were born in 1957 or later. You may also need a second dose. Meningococcal conjugate (MenACWY) vaccine  You may need this if you have certain conditions. Hepatitis A vaccine  You may need this if you have certain conditions or if you travel or work in places where you may be exposed  to hepatitis A. Hepatitis B vaccine  You may need this if you have certain conditions or if you travel or work in places where you may be exposed to hepatitis B. Haemophilus influenzae type b (Hib) vaccine  You may need this if you have certain conditions. You may receive vaccines as individual doses or as more than one vaccine together in one shot (combination vaccines). Talk with your health care provider about the risks and benefits of combination vaccines. What tests do I need? Blood tests  Lipid and cholesterol levels. These may be checked every 5 years, or more frequently depending on your overall health.  Hepatitis C test.  Hepatitis B test. Screening  Lung cancer screening. You may have this screening every year starting at age 39 if you have a 30-pack-year history of smoking and currently smoke or have quit within the past 15 years.  Colorectal cancer screening. All adults should have this screening starting at age 36 and continuing until age 15. Your health care provider may recommend screening at age 23 if you are at increased risk. You will have tests every 1-10 years, depending on your results and the type of screening test.  Diabetes screening. This is done by checking your blood sugar (glucose) after you have not eaten for a while (fasting). You may have this done every 1-3 years.  Mammogram. This may be done every 1-2 years. Talk with your health care provider about how often you should have regular mammograms.  BRCA-related cancer screening. This may be done if you have a family history of breast, ovarian, tubal, or peritoneal cancers.  Other tests  Sexually transmitted disease (STD) testing.  Bone density scan. This is done to screen for osteoporosis. You may have this done starting at age 76. Follow these instructions at home: Eating and drinking  Eat a diet that includes fresh fruits and vegetables, whole grains, lean protein, and low-fat dairy products. Limit  your intake of foods with high amounts of sugar, saturated fats, and salt.  Take vitamin and mineral supplements as recommended by your health care provider.  Do not drink alcohol if your health care provider tells you not to drink.  If you drink alcohol: ? Limit how much you have to 0-1 drink a day. ? Be aware of how much alcohol is in your drink. In the [place] one drink equals one 12 oz bottle of beer (355 mL), one 5 oz glass of wine (148 mL), or one 1 oz glass of hard liquor (44 mL). Lifestyle  Take daily care of your teeth and gums.  Stay active. Exercise for at least 30 minutes on 5 or more days each week.  Do not use any products that contain nicotine or tobacco, such as cigarettes, e-cigarettes, and chewing tobacco. If you need help quitting, ask your health care provider.  If you are sexually active, practice safe sex. Use a condom or other form of protection in order to prevent STIs (sexually transmitted infections).  Talk with your health care provider about taking a low-dose aspirin or statin. What's next?  Go to your health care provider once a year for a well check visit.  Ask your health care provider how often you should have your eyes and teeth checked.  Stay up to date on all vaccines. This information is not intended to replace advice given to you by your health care provider. Make sure you discuss any questions you have with your health care provider. Document Released: 02/28/2015 Document Revised: 01/26/2018 Document Reviewed: 01/26/2018 Elsevier Patient Education  2020 [employer].

## 2018-10-27 NOTE — Progress Notes (Signed)
MEDICARE ANNUAL WELLNESS VISIT  10/27/2018  Telephone Visit Disclaimer This Medicare AWV was conducted by telephone due to national recommendations for restrictions regarding the COVID-19 Pandemic (e.g. social distancing).  I verified, using two identifiers, that I am speaking with Joyce Ross or their authorized healthcare agent. I discussed the limitations, risks, security, and privacy concerns of performing an evaluation and management service by telephone and the potential availability of an in-person appointment in the future. The patient expressed understanding and agreed to proceed.   Subjective:  Joyce Ross is a 68 y.o. female patient of Terald Sleeper, PA-C who had a Medicare Annual Wellness Visit today via telephone. Tineka is Retired and lives with their son. she has 2 children. she reports that she is socially active and does interact with friends/family regularly. she is minimally physically active and enjoys reading, making wreaths, knitting, spending time with her family and walking her dogs.  Patient Care Team: Theodoro Clock as PCP - General (Physician Assistant)  Advanced Directives 10/27/2018 07/31/2018 07/26/2018 01/05/2018 12/05/2017 10/25/2017  Does Patient Have a Medical Advance Directive? No No No No No No  Would patient like information on creating a medical advance directive? No - Patient declined No - Patient declined Yes (MAU/Ambulatory/Procedural Areas - Information given) - No - Patient declined Yes (MAU/Ambulatory/Procedural Areas - Information given)    Hospital Utilization Over the Past 12 Months: # of hospitalizations or ER visits: 1 # of surgeries: 5- all for kidney stones  Review of Systems    Patient reports that her overall health is unchanged compared to last year.  History obtained from chart review  Patient Reported Readings (BP, Pulse, CBG, Weight, etc) none  Pain Assessment Pain : No/denies pain     Current  Medications & Allergies (verified) Allergies as of 10/27/2018      Reactions   Adhesive [tape] Other (See Comments)   "leaves red places on skin"   Metoprolol    nightmares   Penicillins Other (See Comments)   Thrush Has patient had a PCN reaction causing immediate rash, facial/tongue/throat swelling, SOB or lightheadedness with hypotension: No Has patient had a PCN reaction causing severe rash involving mucus membranes or skin necrosis: No Has patient had a PCN reaction that required hospitalization: No Has patient had a PCN reaction occurring within the last 10 years: Yes If all of the above answers are "NO", then may proceed with Cephalosporin use.   Radish [raphanus Sativus] Hives   Yellow Jacket Venom Itching, Rash      Medication List       Accurate as of October 27, 2018  8:54 AM. If you have any questions, ask your nurse or doctor.        STOP taking these medications   HYDROcodone-acetaminophen 5-325 MG tablet Commonly known as: Norco   tamsulosin 0.4 MG Caps capsule Commonly known as: FLOMAX     TAKE these medications   acetaminophen 325 MG tablet Commonly known as: TYLENOL Take 325 mg by mouth every 6 (six) hours as needed for moderate pain.   albuterol 108 (90 Base) MCG/ACT inhaler Commonly known as: VENTOLIN HFA Inhale 1-2 puffs into the lungs every 6 (six) hours as needed for wheezing or shortness of breath.   budesonide-formoterol 80-4.5 MCG/ACT inhaler Commonly known as: Symbicort Inhale 2 puffs into the lungs 2 (two) times daily.   cetirizine 10 MG tablet Commonly known as: ZYRTEC Take 1 tablet (10 mg total) by mouth daily.  citalopram 40 MG tablet Commonly known as: CELEXA Take 1 tablet (40 mg total) by mouth daily.   cyclobenzaprine 10 MG tablet Commonly known as: FLEXERIL Take 1 tablet (10 mg total) by mouth 3 (three) times daily as needed. for muscle spams   fluticasone 50 MCG/ACT nasal spray Commonly known as: FLONASE Place 2 sprays  into both nostrils daily.   gabapentin 300 MG capsule Commonly known as: NEURONTIN TAKE ONE CAPSULE BY MOUTH three TIMES A DAY   ibuprofen 200 MG tablet Commonly known as: ADVIL Take 400 mg by mouth every 6 (six) hours as needed.   Melatonin 1 MG Tabs Take 1 mg by mouth at bedtime as needed (sleep).   pantoprazole 20 MG tablet Commonly known as: Protonix Take 1 tablet (20 mg total) by mouth daily.   pravastatin 80 MG tablet Commonly known as: PRAVACHOL Take 1 tablet (80 mg total) by mouth at bedtime.       History (reviewed): Past Medical History:  Diagnosis Date  . Coronary artery calcification seen on CAT scan 09/28/2017  . Dyspnea   . Full dentures   . GERD (gastroesophageal reflux disease)   . Heart murmur    found a couple of years ago no problems  . History of kidney stones   . History of recurrent UTIs   . Hyperlipidemia   . Left-sided Bell's palsy 1990s   residual left facial drooping  . Mild intermittent asthma    followed by pcp  . Numbness and tingling in left arm    residual from cervical neck surgery going from shoulder down to hand, not completely  . OA (osteoarthritis)    hands, spine, hips, right leg  . Peripheral neuropathy    feet  . PONV (postoperative nausea and vomiting)   . Thoracic ascending aortic aneurysm ([place])    CT 09-28-2017--- 4.3 cm  . Thyroid nodule    ultrasound w/ bx 09/ 2019,  per pt was benign  . White coat syndrome without hypertension    Past Surgical History:  Procedure Laterality Date  . ANTERIOR CERVICAL DECOMP/DISCECTOMY FUSION  1980s  . CARPAL TUNNEL RELEASE Bilateral 2000  . CESAREAN SECTION  x2 last one 77  . CYSTOSCOPY WITH RETROGRADE PYELOGRAM, URETEROSCOPY AND STENT PLACEMENT Left 01/11/2018   Procedure: CYSTOSCOPY, URETEROSCOPY AND STENT PLACEMENT;  Surgeon: Lucas Mallow, MD;  Location: [facility];  Service: Urology;  Laterality: Left;  . CYSTOSCOPY/URETEROSCOPY/HOLMIUM LASER/STENT PLACEMENT Left 07/31/2018    Procedure: CYSTOSCOPY/URETEROSCOPY/HOLMIUM LASER/STENT PLACEMENT;  Surgeon: Lucas Mallow, MD;  Location: [facility];  Service: Urology;  Laterality: Left;  . EXTRACORPOREAL SHOCK WAVE LITHOTRIPSY Left 12/05/2017   Procedure: LEFT EXTRACORPOREAL SHOCK WAVE LITHOTRIPSY (ESWL);  Surgeon: Franchot Gallo, MD;  Location: [facility];  Service: Urology;  Laterality: Left;  . EXTRACORPOREAL SHOCK WAVE LITHOTRIPSY  1990s  . HOLMIUM LASER APPLICATION Left [id number]   Procedure: HOLMIUM LASER APPLICATION;  Surgeon: Lucas Mallow, MD;  Location: [facility];  Service: Urology;  Laterality: Left;  . MOUTH SURGERY     all teeth pulled   Family History  Problem Relation Age of Onset  . Heart disease Mother   . Heart disease Father        heart attack  . Heart disease Sister   . Diabetes Brother   . Diabetes Brother        sepsis  . Heart disease Brother    Social History   Socioeconomic History  . Marital status: Widowed  Spouse name: Not on file  . Number of children: 2  . Years of education: 12th Grade  . Highest education level: High school graduate  Occupational History  . Occupation: retired  Scientific laboratory technician  . Financial resource strain: Not hard at all  . Food insecurity    Worry: Never true    Inability: Never true  . Transportation needs    Medical: No    Non-medical: No  Tobacco Use  . Smoking status: Former Smoker    Years: 44.00    Types: Cigarettes    Quit date: 10/09/2012    Years since quitting: 6.0  . Smokeless tobacco: Never Used  . Tobacco comment: since age 39  Substance and Sexual Activity  . Alcohol use: No  . Drug use: No    Comment: per pt last used "pot" 1970s  . Sexual activity: Not Currently    Birth control/protection: Post-menopausal  Lifestyle  . Physical activity    Days per week: 7 days    Minutes per session: 10 min  . Stress: Not at all  Relationships  . Social connections    Talks on phone: More than three times a week    Gets together:  More than three times a week    Attends religious service: Never    Active member of club or organization: No    Attends meetings of clubs or organizations: Never    Relationship status: Widowed  Other Topics Concern  . Not on file  Social History Narrative  . Not on file    Activities of Daily Living In your present state of health, do you have any difficulty performing the following activities: 10/27/2018 07/26/2018  Hearing? Y Y  Comment pt has to have people repeat what they said frequently Hard of hearing in both ear  Vision? Y N  Comment last eye exam > 5 years ago-she is going to make an appointment to go get her eyes checked -  Difficulty concentrating or making decisions? Y Y  Comment memory isn't as good as it used to be Mild memory deficit  Walking or climbing stairs? Y Y  Comment due to hip pain -  Dressing or bathing? N N  Doing errands, shopping? N N  Preparing Food and eating ? N -  Using the Toilet? N -  In the past six months, have you accidently leaked urine? Y -  Comment wears pads at all times -  Do you have problems with loss of bowel control? N -  Managing your Medications? N -  Managing your Finances? N -  Housekeeping or managing your Housekeeping? N -  Some recent data might be hidden    Patient Education/ Literacy How often do you need to have someone help you when you read instructions, pamphlets, or other written materials from your doctor or pharmacy?: 1 - Never What is the last grade level you completed in school?: 12th grade  Exercise Current Exercise Habits: Home exercise routine, Type of exercise: walking, Time (Minutes): 15, Frequency (Times/Week): 7, Weekly Exercise (Minutes/Week): 105, Intensity: Mild, Exercise limited by: orthopedic condition(s)  Diet Patient reports consuming 2 meals a day and 2 snack(s) a day Patient reports that her primary diet is: Regular Patient reports that she does have regular access to food.   Depression  Screen PHQ 2/9 Scores 10/27/2018 05/09/2018 12/14/2017 10/25/2017 09/08/2017 06/09/2017 12/02/2016  PHQ - 2 Score 0 2 2 2 2  0 0  PHQ- 9 Score - 6 7  9 8 - -     Fall Risk Fall Risk  10/27/2018 05/09/2018 12/14/2017 10/25/2017 09/08/2017  Falls in the past year? 1 1 Yes Yes No  Number falls in past yr: 1 0 2 or more 2 or more -  Injury with Fall? 1 1 No No -  Risk Factor Category  - - - - -  Risk for fall due to : - - History of fall(s) History of fall(s) -  Follow up Falls prevention discussed - - - -  Comment get rid of all throw rugs in the house, adequate lighting in the walkways and grab bars in the bathroom - - - -     Objective:  Joyce Ross seemed alert and oriented and she participated appropriately during our telephone visit.  Blood Pressure Weight BMI  BP Readings from Last 3 Encounters:  07/31/18 (!) 150/70  07/26/18 (!) 134/94  05/09/18 136/73   Wt Readings from Last 3 Encounters:  07/26/18 174 lb (78.9 kg)  05/09/18 178 lb 3.2 oz (80.8 kg)  01/11/18 175 lb 2 oz (79.4 kg)   BMI Readings from Last 1 Encounters:  07/26/18 35.44 kg/m    *Unable to obtain current vital signs, weight, and BMI due to telephone visit type  Hearing/Vision  . Emerey did not seem to have difficulty with hearing/understanding during the telephone conversation . Reports that she has not had a formal eye exam by an eye care professional within the past year . Reports that she has not had a formal hearing evaluation within the past year *Unable to fully assess hearing and vision during telephone visit type  Cognitive Function: 6CIT Screen 10/27/2018  What Year? 0 points  What month? 0 points  What time? 0 points  Count back from 20 0 points  Months in reverse 0 points  Repeat phrase 6 points  Total Score 6   (Normal:0-7, Significant for Dysfunction: >8)  Normal Cognitive Function Screening: Yes   Immunization & Health Maintenance Record Immunization History  Administered Date(s)  Administered  . Pneumococcal Conjugate-13 10/25/2017  . Tdap 02/05/2013    Health Maintenance  Topic Date Due  . COLONOSCOPY  11/26/2000  . INFLUENZA VACCINE  09/16/2018  . PNA vac Low Risk Adult (2 of 2 - PPSV23) 10/26/2018  . MAMMOGRAM  01/14/2020  . TETANUS/TDAP  02/06/2023  . DEXA SCAN  Completed  . Hepatitis C Screening  Completed       Assessment  This is a routine wellness examination for Joyce Ross.  Health Maintenance: Due or Overdue Health Maintenance Due  Topic Date Due  . COLONOSCOPY  11/26/2000  . INFLUENZA VACCINE  09/16/2018  . PNA vac Low Risk Adult (2 of 2 - PPSV23) 10/26/2018    Joyce Ross does not need a referral for Community Assistance: Care Management:   no Social Work:    no Prescription Assistance:  no Nutrition/Diabetes Education:  no   Plan:  Personalized Goals Goals Addressed            This Visit's Progress   . DIET - INCREASE WATER INTAKE       Try to drink 6-8 glasses of water daily.      Personalized Health Maintenance & Screening Recommendations  Pneumococcal vaccine  Influenza vaccine Colorectal cancer screening Shingles vaccine  Lung Cancer Screening Recommended: no (Low Dose CT Chest recommended if Age 61-80 years, 30 pack-year currently smoking OR have quit w/in past 15 years) Hepatitis C Screening recommended:  no HIV Screening recommended: no  Advanced Directives: Written information was not prepared per patient's request.  Referrals & Orders No orders of the defined types were placed in this encounter.   Follow-up Plan . Follow-up with Terald Sleeper, PA-C as planned . Consider Flu, Pneumovax and Shingles vaccines at your next visit with your PCP . Pt declined referral for colonoscopy at this time but will consider in the future   I have personally reviewed and noted the following in the patient's chart:   . Medical and social history . Use of alcohol, tobacco or illicit drugs  . Current  medications and supplements . Functional ability and status . Nutritional status . Physical activity . Advanced directives . List of other physicians . Hospitalizations, surgeries, and ER visits in previous 12 months . Vitals . Screenings to include cognitive, depression, and falls . Referrals and appointments  In addition, I have reviewed and discussed with Joyce Ross certain preventive protocols, quality metrics, and best practice recommendations. A written personalized care plan for preventive services as well as general preventive health recommendations is available and can be mailed to the patient at her request.      Marylin Crosby, LPN  [id number]

## 2018-12-04 ENCOUNTER — Other Ambulatory Visit: Payer: Self-pay | Admitting: *Deleted

## 2018-12-04 DIAGNOSIS — M545 Low back pain, unspecified: Secondary | ICD-10-CM

## 2018-12-04 MED ORDER — CYCLOBENZAPRINE HCL 10 MG PO TABS: 10.0000 mg | ORAL_TABLET | Freq: Three times a day (TID) | ORAL | 2 refills | Status: DC | PRN

## 2018-12-29 ENCOUNTER — Telehealth: Payer: Self-pay | Admitting: Physician Assistant

## 2018-12-29 NOTE — Chronic Care Management (AMB) (Signed)
Chronic Care Management   Note  12/29/2018 Name: Joyce Ross MRN: [id number] DOB: January 15, 1951  Joyce Ross is a 68 y.o. year old female who is a primary care patient of Terald Sleeper, PA-C. I reached out to Shade Flood by phone today in response to a referral sent by Ms. Mabeline Caras Veal's health plan.     [name] was given information about Chronic Care Management services today including:  1. CCM service includes personalized support from designated clinical staff supervised by her physician, including individualized plan of care and coordination with other care providers 2. 24/7 contact phone numbers for assistance for urgent and routine care needs. 3. Service will only be billed when office clinical staff spend 20 minutes or more in a month to coordinate care. 4. Only one practitioner may furnish and bill the service in a calendar month. 5. The patient may stop CCM services at any time (effective at the end of the month) by phone call to the office staff. 6. The patient will be responsible for cost sharing (co-pay) of up to 20% of the service fee (after annual deductible is met).  Patient did not agree to enrollment in care management services and does not wish to consider at this time.  Follow up plan: The patient has been provided with contact information for the chronic care management team and has been advised to call with any health related questions or concerns.   [place], [place] [address] Direct Dial: [email or web address]  Website: [email or web address]

## 2019-01-03 ENCOUNTER — Other Ambulatory Visit: Payer: Self-pay

## 2019-01-03 DIAGNOSIS — Z20822 Contact with and (suspected) exposure to covid-19: Secondary | ICD-10-CM

## 2019-01-04 ENCOUNTER — Telehealth: Payer: Self-pay | Admitting: *Deleted

## 2019-01-04 NOTE — Telephone Encounter (Signed)
Patient called for results,patient was advised to call back as results are still pending.

## 2019-01-05 LAB — NOVEL CORONAVIRUS, NAA: SARS-CoV-2, NAA: NOT DETECTED

## 2019-01-05 LAB — SPECIMEN STATUS REPORT

## 2019-01-09 ENCOUNTER — Ambulatory Visit: Payer: Medicare Other | Admitting: *Deleted

## 2019-01-09 DIAGNOSIS — G629 Polyneuropathy, unspecified: Secondary | ICD-10-CM

## 2019-01-09 DIAGNOSIS — J449 Chronic obstructive pulmonary disease, unspecified: Secondary | ICD-10-CM

## 2019-01-09 DIAGNOSIS — F339 Major depressive disorder, recurrent, unspecified: Secondary | ICD-10-CM

## 2019-01-09 DIAGNOSIS — I10 Essential (primary) hypertension: Secondary | ICD-10-CM

## 2019-01-09 NOTE — Patient Instructions (Signed)
  Follow Up Plan:  RN will reach out to patient again over the next 30 days if no return call is received  Chong Sicilian, BSN, RN-BC [place] [facility] [place] [facility] Direct Dial: [phone]

## 2019-01-09 NOTE — Chronic Care Management (AMB) (Signed)
  Chronic Care Management   Outreach Note  01/09/2019 Name: Joyce Ross MRN: [id number] DOB: August 26, 1950  Referred by: Terald Sleeper, PA-C Reason for referral : Chronic Care Management (CCM Initial Outreach)  I reached out to [name] by telephone today for an Initial CCM visit but was unable to talk with her. I did leave a HIPAA compliant voicemail requesting a return phone call.    Follow Up Plan:  RN will reach out to patient again over the next 30 days if no return call is received  Chong Sicilian, BSN, RN-BC [place] [facility] [place] [facility] Direct Dial: [phone]

## 2019-02-02 ENCOUNTER — Telehealth: Payer: Medicare Other

## 2019-02-20 ENCOUNTER — Ambulatory Visit (INDEPENDENT_AMBULATORY_CARE_PROVIDER_SITE_OTHER): Payer: Medicare Other | Admitting: *Deleted

## 2019-02-20 ENCOUNTER — Encounter: Payer: Self-pay | Admitting: *Deleted

## 2019-02-20 DIAGNOSIS — I10 Essential (primary) hypertension: Secondary | ICD-10-CM

## 2019-02-20 DIAGNOSIS — J449 Chronic obstructive pulmonary disease, unspecified: Secondary | ICD-10-CM

## 2019-02-20 DIAGNOSIS — M1990 Unspecified osteoarthritis, unspecified site: Secondary | ICD-10-CM | POA: Insufficient documentation

## 2019-02-20 NOTE — Chronic Care Management (AMB) (Signed)
Chronic Care Management   Initial Visit Note  02/20/2019 Name: Joyce Ross MRN: [id number] DOB: April 29, 1950  Referred by: Terald Sleeper, PA-C Reason for referral : Chronic Care Management (RN initial visit)   Joyce Ross is a 69 y.o. year old female who is a primary care patient of Terald Sleeper, PA-C. The CCM team was consulted for assistance with chronic disease management and care coordination needs related to hypertension, asthma, GERD, osteoarthritis, neuropathy, adjustment disorder with anxiety and depression.  Review of patient status, including review of consultants reports, relevant laboratory and other test results, and collaboration with appropriate care team members and the patient's provider was performed as part of comprehensive patient evaluation and provision of chronic care management services.    SDOH (Social Determinants of Health) screening performed today: Social Connections Physical Activity. See Care Plan for related entries.    Subjective: I spoke with Joyce Ross by telephone today. She is working with the [facility] program to get her medications from the manufacturer for free. She is concerned about some ongoing lower back pain and right shoulder pain that she developed after a fall on Thanksgiving. Reports a history of bursitis in that shoulder.   Objective: Outpatient Encounter Medications as of 02/20/2019  Medication Sig  . acetaminophen (TYLENOL) 325 MG tablet Take 325 mg by mouth every 6 (six) hours as needed for moderate pain.   Marland Kitchen albuterol (PROVENTIL HFA;VENTOLIN HFA) 108 (90 Base) MCG/ACT inhaler Inhale 1-2 puffs into the lungs every 6 (six) hours as needed for wheezing or shortness of breath.  . budesonide-formoterol (SYMBICORT) 80-4.5 MCG/ACT inhaler Inhale 2 puffs into the lungs 2 (two) times daily. (Patient taking differently: Inhale 2 puffs into the lungs 2 (two) times daily. )  . cetirizine (ZYRTEC) 10 MG tablet  Take 1 tablet (10 mg total) by mouth daily.  . citalopram (CELEXA) 40 MG tablet Take 1 tablet (40 mg total) by mouth daily.  . cyclobenzaprine (FLEXERIL) 10 MG tablet Take 1 tablet (10 mg total) by mouth 3 (three) times daily as needed. for muscle spams  . fluticasone (FLONASE) 50 MCG/ACT nasal spray Place 2 sprays into both nostrils daily.  Marland Kitchen gabapentin (NEURONTIN) 300 MG capsule TAKE ONE CAPSULE BY MOUTH three TIMES A DAY  . ibuprofen (ADVIL) 200 MG tablet Take 400 mg by mouth every 6 (six) hours as needed.  . Melatonin 1 MG TABS Take 1 mg by mouth at bedtime as needed (sleep).   . pantoprazole (PROTONIX) 20 MG tablet Take 1 tablet (20 mg total) by mouth daily.  . pravastatin (PRAVACHOL) 80 MG tablet Take 1 tablet (80 mg total) by mouth at bedtime.   No facility-administered encounter medications on file as of 02/20/2019.     Objective:   RN Assessment & Care Plan   . Hearing Aids (pt-stated)       Current Barriers:  Marland Kitchen Knowledge Deficits related to how to obtain affordable hearing aids  Nurse Case Manager Clinical Goal(s):  Marland Kitchen Over the next 60 days, patient will meet with RN Care Manager to address hearing loss and need for hearing evaluation and potential hearing aids  Interventions:  . Talked with patient regarding perceived need for hearing aids . Discussed potential insurance coverage . Care Guide referral to research affordable hearing aids  Patient Self Care Activities:  . Performs ADL's independently . Performs IADL's independently  Initial goal documentation     . Shoulder Pain (pt-stated)  Right shoulder pain in patient with osteoarthritis  Current Barriers:  Marland Kitchen Knowledge Deficits related to right shoulder pain treatment  Nurse Case Manager Clinical Goal(s):  Marland Kitchen Over the next 30 days, patient will work with Consulting civil engineer and PCP to address needs related to right shoulder pain s/p fall on Thanksgiving  Interventions:  . Evaluation of current treatment plan  related to right shoulder pain and patient's adherence to plan as established by provider. . Reviewed medications with patient and discussed NSAIDs . Discussed plans with patient for ongoing care management follow up and provided patient with direct contact information for care management team . Recommended 2 ibuprofen and 1 acetaminophen TID PRN pain  Patient Self Care Activities:  . Performs ADL's independently . Performs IADL's independently   Initial goal documentation       . Chronic Disease Management Needs       Current Barriers:  . Chronic Disease Management support, education, and care coordination needs related to hypertension, asthma, GERD, osteoarthritis, neuropathy, adjustment disorder with anxiety and depression  Clinical Goal(s) related to hypertension, asthma, GERD, osteoarthritis, neuropathy, adjustment disorder with anxiety and depression:  Over the next 90 days, patient will:  . Work with the care management team to address educational, disease management, and care coordination needs   . Call provider office for new or worsened signs and symptoms  . Call care management team with questions or concerns . Verbalize basic understanding of patient centered plan of care established today  Interventions related to hypertension, asthma, GERD, osteoarthritis, neuropathy, adjustment disorder with anxiety and depression:  . Evaluation of current treatment plans and patient's adherence to plan as established by provider . Assessed patient understanding of disease states . Assessed patient's education and care coordination needs . Provided disease specific education to patient  . Collaborated with appropriate clinical care team members regarding patient needs  Patient Self Care Activities related to hypertension, asthma, GERD, osteoarthritis, neuropathy, adjustment disorder with anxiety and depression:  . Patient is unable to independently self-manage chronic health  conditions  Initial goal documentation     . Low Back Pain       Low back pain in patient with osteoarthritis  Current Barriers:  . Chronic Disease Management support and education needs related to osteoarthritis and low back pain  Nurse Case Manager Clinical Goal(s):  Marland Kitchen Over the next 30 days, patient will work with Consulting civil engineer and PCP to address needs related to low back pain.   Interventions:  . Evaluation of current treatment plan related to low back pain and patient's adherence to plan as established by provider. . Reviewed medications with patient and discussed NSAID use . Discussed plans with patient for ongoing care management follow up and provided patient with direct contact information for care management team . Recommended taking two ibuprofen and one acetaminophen TID for pain . RN will schedule patient appointment to discuss low back pain and potential referral for imaging/specialist  Patient Self Care Activities:  . Performs ADL's independently  Initial goal documentation         Follow-up Plan:   The care management team will reach out to the patient again over the next 3 days.   Provider Signature

## 2019-02-20 NOTE — Patient Instructions (Signed)
Visit Information  Goals Addressed            This Visit's Progress     Patient Stated   . Hearing Aids (pt-stated)       Current Barriers:  Marland Kitchen Knowledge Deficits related to how to obtain affordable hearing aids  Nurse Case Manager Clinical Goal(s):  Marland Kitchen Over the next 60 days, patient will meet with RN Care Manager to address hearing loss and need for hearing evaluation and potential hearing aids  Interventions:  . Talked with patient regarding perceived need for hearing aids . Discussed potential insurance coverage . Care Guide referral to research affordable hearing aids  Patient Self Care Activities:  . Performs ADL's independently . Performs IADL's independently  Initial goal documentation     . Shoulder Pain (pt-stated)       Right shoulder pain in patient with osteoarthritis  Current Barriers:  Marland Kitchen Knowledge Deficits related to right shoulder pain treatment  Nurse Case Manager Clinical Goal(s):  Marland Kitchen Over the next 30 days, patient will work with Consulting civil engineer and PCP to address needs related to right shoulder pain s/p fall on Thanksgiving  Interventions:  . Evaluation of current treatment plan related to right shoulder pain and patient's adherence to plan as established by provider. . Reviewed medications with patient and discussed NSAIDs . Discussed plans with patient for ongoing care management follow up and provided patient with direct contact information for care management team . Recommended 2 ibuprofen and 1 acetaminophen TID PRN pain  Patient Self Care Activities:  . Performs ADL's independently . Performs IADL's independently   Initial goal documentation       Other   . Chronic Disease Management Needs       Current Barriers:  . Chronic Disease Management support, education, and care coordination needs related to hypertension, asthma, GERD, osteoarthritis, neuropathy, adjustment disorder with anxiety and depression  Clinical Goal(s) related to  hypertension, asthma, GERD, osteoarthritis, neuropathy, adjustment disorder with anxiety and depression:  Over the next 90 days, patient will:  . Work with the care management team to address educational, disease management, and care coordination needs   . Call provider office for new or worsened signs and symptoms  . Call care management team with questions or concerns . Verbalize basic understanding of patient centered plan of care established today  Interventions related to hypertension, asthma, GERD, osteoarthritis, neuropathy, adjustment disorder with anxiety and depression:  . Evaluation of current treatment plans and patient's adherence to plan as established by provider . Assessed patient understanding of disease states . Assessed patient's education and care coordination needs . Provided disease specific education to patient  . Collaborated with appropriate clinical care team members regarding patient needs  Patient Self Care Activities related to hypertension, asthma, GERD, osteoarthritis, neuropathy, adjustment disorder with anxiety and depression:  . Patient is unable to independently self-manage chronic health conditions  Initial goal documentation     . Low Back Pain       Low back pain in patient with osteoarthritis  Current Barriers:  . Chronic Disease Management support and education needs related to osteoarthritis and low back pain  Nurse Case Manager Clinical Goal(s):  Marland Kitchen Over the next 30 days, patient will work with Consulting civil engineer and PCP to address needs related to low back pain.   Interventions:  . Evaluation of current treatment plan related to low back pain and patient's adherence to plan as established by provider. . Reviewed medications with patient and  discussed NSAID use . Discussed plans with patient for ongoing care management follow up and provided patient with direct contact information for care management team . Recommended taking two ibuprofen and  one acetaminophen TID for pain . RN will schedule patient appointment to discuss low back pain and potential referral for imaging/specialist  Patient Self Care Activities:  . Performs ADL's independently  Initial goal documentation       The care management team will reach out to the patient again over the next 3 days.    Joyce Ross was given information about Chronic Care Management services today including:  1. CCM service includes personalized support from designated clinical staff supervised by her physician, including individualized plan of care and coordination with other care providers 2. 24/7 contact phone numbers for assistance for urgent and routine care needs. 3. Service will only be billed when office clinical staff spend 20 minutes or more in a month to coordinate care. 4. Only one practitioner may furnish and bill the service in a calendar month. 5. The patient may stop CCM services at any time (effective at the end of the month) by phone call to the office staff. 6. The patient will be responsible for cost sharing (co-pay) of up to 20% of the service fee (after annual deductible is met).  Patient agreed to services and verbal consent obtained.   Chong Sicilian, BSN, RN-BC Embedded Chronic Care Manager [facility] / [place] [facility] Direct Dial: [phone]   The patient verbalized understanding of instructions provided today and declined a print copy of patient instruction materials.

## 2019-02-21 ENCOUNTER — Telehealth: Payer: Self-pay

## 2019-02-21 NOTE — Telephone Encounter (Signed)
-----   Message from Ilean China, RN sent at 02/20/2019  5:37 PM EST ----- Regarding: Schedule Appt Please call patient to schedule an appointment with [facility] for right shoulder pain s/p fall on Thanksgiving and for ongoing low back pain.   Thank you! Joyce Ross

## 2019-02-21 NOTE — Telephone Encounter (Signed)
Apt scheduled.  

## 2019-02-22 ENCOUNTER — Telehealth: Payer: Medicare Other

## 2019-02-23 ENCOUNTER — Ambulatory Visit: Payer: Medicare Other | Admitting: Physician Assistant

## 2019-02-23 ENCOUNTER — Ambulatory Visit: Payer: Medicare Other | Admitting: *Deleted

## 2019-02-23 DIAGNOSIS — M1990 Unspecified osteoarthritis, unspecified site: Secondary | ICD-10-CM

## 2019-02-23 NOTE — Patient Instructions (Signed)
Visit Information  Goals Addressed            This Visit's Progress     Patient Stated   . Shoulder Pain (pt-stated)       Right shoulder pain in patient with osteoarthritis  Current Barriers:  Marland Kitchen Knowledge Deficits related to right shoulder pain treatment  Nurse Case Manager Clinical Goal(s):  Marland Kitchen Over the next 30 days, patient will work with Consulting civil engineer and PCP to address needs related to right shoulder pain s/p fall on Thanksgiving  Interventions:  . Chart reviewed . Collaborated with PCP office to schedule appointment with PCP  . Patient is scheduled for 02/22/2018 with Particia Nearing, PA-C to discuss shoulder pain . RN will follow-up with patient over the next 15 days to discuss plan  Patient Self Care Activities:  . Performs ADL's independently . Performs IADL's independently   Please see past updates related to this goal by clicking on the "Past Updates" button in the selected goal          Chong Sicilian, BSN, RN-BC [place] / [place]: [phone]

## 2019-02-23 NOTE — Chronic Care Management (AMB) (Signed)
  Chronic Care Management   Care Coordination Note   02/23/2019 Name: Joyce Ross MRN: [id number] DOB: 1950/07/14  Referred by: Terald Sleeper, PA-C Reason for referral : Chronic Care Management (Care Coordination)   Joyce Ross is a 69 y.o. year old female who is a primary care patient of Terald Sleeper, PA-C. The CCM team was consulted for assistance with chronic disease management and care coordination needs.    Review of patient status, including review of consultants reports, relevant laboratory and other test results, and collaboration with appropriate care team members and the patient's provider was performed as part of comprehensive patient evaluation and provision of chronic care management services.      RN Care Plan   . Shoulder Pain       Right shoulder pain in patient with osteoarthritis  Current Barriers:  Marland Kitchen Knowledge Deficits related to right shoulder pain treatment  Nurse Case Manager Clinical Goal(s):  Marland Kitchen Over the next 30 days, patient will work with Consulting civil engineer and PCP to address needs related to right shoulder pain s/p fall on Thanksgiving  Interventions:  . Chart reviewed . Collaborated with PCP office to schedule appointment with PCP  . Patient is scheduled for 02/22/2018 with Particia Nearing, PA-C to discuss shoulder pain . RN will follow-up with patient over the next 15 days to discuss plan  Patient Self Care Activities:  . Performs ADL's independently . Performs IADL's independently   Please see past updates related to this goal by clicking on the "Past Updates" button in the selected goal         Follow-up Plan The care management team will reach out to the patient again over the next 15 days.   Chong Sicilian, BSN, RN-BC Embedded Chronic Care Manager [facility] / [place] [facility] Direct Dial: [phone]

## 2019-03-08 ENCOUNTER — Ambulatory Visit: Payer: Medicare Other | Admitting: *Deleted

## 2019-03-08 DIAGNOSIS — M1990 Unspecified osteoarthritis, unspecified site: Secondary | ICD-10-CM

## 2019-03-08 NOTE — Chronic Care Management (AMB) (Signed)
  Chronic Care Management   Outreach Note  03/08/2019 Name: WAYNETTA WENNERSTROM MRN: [id number] DOB: 1950/12/25  Referred by: Terald Sleeper, PA-C Reason for referral : Chronic Care Management (RN follow up)   An unsuccessful telephone follow-up was attempted today. The patient was referred to the case management team by for assistance with care management and care coordination.   RN Care Plan   . Shoulder Pain       Right shoulder pain in patient with osteoarthritis  Current Barriers:  Marland Kitchen Knowledge Deficits related to right shoulder pain treatment  Nurse Case Manager Clinical Goal(s):  Marland Kitchen Over the next 30 days, patient will work with Consulting civil engineer and PCP to address needs related to right shoulder pain s/p fall on Thanksgiving  Interventions:  . Chart reviewed . Appt with PCP on 02/23/2019 was cancelled . Reached out to patient by telephone and left a voicemail requesting a return phone call . RN will f/u with patient again over the next 2 weeks if no return call received  Patient Self Care Activities:  . Performs ADL's independently . Performs IADL's independently   Please see past updates related to this goal by clicking on the "Past Updates" button in the selected goal         Follow Up Plan: A HIPPA compliant phone message was left for the patient providing contact information and requesting a return call.  The care management team will reach out to the patient again over the next 15 days.   Chong Sicilian, BSN, RN-BC Embedded Chronic Care Manager [facility] / [place] [facility] Direct Dial: [phone]

## 2019-03-08 NOTE — Patient Instructions (Signed)
Visit Information  Goals Addressed            This Visit's Progress     Patient Stated   . Shoulder Pain (pt-stated)       Right shoulder pain in patient with osteoarthritis  Current Barriers:  Marland Kitchen Knowledge Deficits related to right shoulder pain treatment  Nurse Case Manager Clinical Goal(s):  Marland Kitchen Over the next 30 days, patient will work with Consulting civil engineer and PCP to address needs related to right shoulder pain s/p fall on Thanksgiving  Interventions:  . Chart reviewed . Appt with PCP on 02/23/2019 was cancelled . Reached out to patient by telephone and left a voicemail requesting a return phone call . RN will f/u with patient again over the next 2 weeks if no return call received  Patient Self Care Activities:  . Performs ADL's independently . Performs IADL's independently   Please see past updates related to this goal by clicking on the "Past Updates" button in the selected goal         The care management team will reach out to the patient again over the next 15 days.   Chong Sicilian, BSN, RN-BC Embedded Chronic Care Manager [facility] / [place] [facility] Direct Dial: [phone]

## 2019-03-19 ENCOUNTER — Ambulatory Visit: Payer: Medicare Other | Admitting: *Deleted

## 2019-03-19 NOTE — Chronic Care Management (AMB) (Signed)
  Chronic Care Management   Outreach Note  03/19/2019 Name: Joyce Ross MRN: [id number] DOB: 08-Oct-1950  Referred by: Terald Sleeper, PA-C Reason for referral : Chronic Care Management (RN follow up)   An unsuccessful telephone follow-up was attempted today. The patient was referred to the case management team for assistance with care management and care coordination. Her account is currently on-hold at [facility] due to a bad debt.   Follow Up Plan: A HIPPA compliant phone message was left for the patient providing contact information and requesting a return call.  The care management team will reach out to the patient again over the next 30 days.   Chong Sicilian, BSN, RN-BC Embedded Chronic Care Manager [facility] / [place] [facility] Direct Dial: [phone]

## 2019-03-23 ENCOUNTER — Other Ambulatory Visit: Payer: Self-pay

## 2019-03-26 ENCOUNTER — Ambulatory Visit (INDEPENDENT_AMBULATORY_CARE_PROVIDER_SITE_OTHER): Payer: Medicare Other | Admitting: Physician Assistant

## 2019-03-26 ENCOUNTER — Encounter: Payer: Self-pay | Admitting: Physician Assistant

## 2019-03-26 DIAGNOSIS — M25519 Pain in unspecified shoulder: Secondary | ICD-10-CM | POA: Diagnosis not present

## 2019-03-26 DIAGNOSIS — G8929 Other chronic pain: Secondary | ICD-10-CM | POA: Diagnosis not present

## 2019-03-26 MED ORDER — MELOXICAM 7.5 MG PO TABS: 7.5000 mg | ORAL_TABLET | Freq: Every day | ORAL | 0 refills | Status: DC

## 2019-03-26 NOTE — Progress Notes (Signed)
Telephone visit  Subjective: [id number] pain chronic PCP: Terald Sleeper, PA-C [id number] Joyce Ross is a 69 y.o. female calls for telephone consult today. Patient provides verbal consent for consult held via phone.  Patient is identified with 2 separate identifiers.  At this time the entire area is on COVID-19 social distancing and stay home orders are in place.  Patient is of higher risk and therefore we are performing this by a virtual method.  Location of patient: home Location of provider: [facility] Others present for call: no  The patient reports that she fell in early December and has had pain in her shoulder ever since.  Primarily it hurts to raise on the lateral side.  She is able to raise it above 90degrees as we discussed this on the phone and she did the movement.  She states it hurts most when she gets near the top.  It does not hurt significantly with flexion or extension anteriorly and posteriorly.  She states she has not had difficulty with fixing her hair.  It does hurt sometimes when she rolls over and sleeps.  She reports that some years ago she did have bursitis in her shoulder.  It was during the time when she was doing a lot of yard work.  She reports that she has been using Tylenol for the pain there has been minimal relief.  She has not taken any anti-inflammatory used any muscle rub.  We have discussed using heat and gentle stretching, plus an anti-inflammatory for the next 10 days.   ROS: Per HPI  Allergies  Allergen Reactions  . Adhesive [Tape] Other (See Comments)    "leaves red places on skin"  . Metoprolol     nightmares  . Penicillins Other (See Comments)    Thrush Has patient had a PCN reaction causing immediate rash, facial/tongue/throat swelling, SOB or lightheadedness with hypotension: No Has patient had a PCN reaction causing severe rash involving mucus membranes or skin necrosis: No Has patient had a PCN reaction that required  hospitalization: No Has patient had a PCN reaction occurring within the last 10 years: Yes If all of the above answers are "NO", then may proceed with Cephalosporin use.   . Radish [Raphanus Sativus] Hives  . Yellow Jacket Venom Itching and Rash   Past Medical History:  Diagnosis Date  . Coronary artery calcification seen on CAT scan 09/28/2017  . Dyspnea   . Full dentures   . GERD (gastroesophageal reflux disease)   . Heart murmur    found a couple of years ago no problems  . History of kidney stones   . History of recurrent UTIs   . Hyperlipidemia   . Left-sided Bell's palsy 1990s   residual left facial drooping  . Mild intermittent asthma    followed by pcp  . Numbness and tingling in left arm    residual from cervical neck surgery going from shoulder down to hand, not completely  . OA (osteoarthritis)    hands, spine, hips, right leg  . Peripheral neuropathy    feet  . PONV (postoperative nausea and vomiting)   . Thoracic ascending aortic aneurysm ([place])    CT 09-28-2017--- 4.3 cm  . Thyroid nodule    ultrasound w/ bx 09/ 2019,  per pt was benign  . White coat syndrome without hypertension     Current Outpatient Medications:  .  acetaminophen (TYLENOL) 325 MG tablet, Take 325 mg by mouth  every 6 (six) hours as needed for moderate pain. , Disp: , Rfl:  .  albuterol (PROVENTIL HFA;VENTOLIN HFA) 108 (90 Base) MCG/ACT inhaler, Inhale 1-2 puffs into the lungs every 6 (six) hours as needed for wheezing or shortness of breath., Disp: , Rfl:  .  budesonide-formoterol (SYMBICORT) 80-4.5 MCG/ACT inhaler, Inhale 2 puffs into the lungs 2 (two) times daily. (Patient taking differently: Inhale 2 puffs into the lungs 2 (two) times daily. ), Disp: 1 Inhaler, Rfl: 12 .  cetirizine (ZYRTEC) 10 MG tablet, Take 1 tablet (10 mg total) by mouth daily., Disp: 90 tablet, Rfl: 3 .  citalopram (CELEXA) 40 MG tablet, Take 1 tablet (40 mg total) by mouth daily., Disp: 90 tablet, Rfl: 3 .   cyclobenzaprine (FLEXERIL) 10 MG tablet, Take 1 tablet (10 mg total) by mouth 3 (three) times daily as needed. for muscle spams, Disp: 60 tablet, Rfl: 2 .  fluticasone (FLONASE) 50 MCG/ACT nasal spray, Place 2 sprays into both nostrils daily., Disp: 16 g, Rfl: 6 .  gabapentin (NEURONTIN) 300 MG capsule, TAKE ONE CAPSULE BY MOUTH three TIMES A DAY, Disp: 90 capsule, Rfl: 5 .  ibuprofen (ADVIL) 200 MG tablet, Take 400 mg by mouth every 6 (six) hours as needed., Disp: , Rfl:  .  Melatonin 1 MG TABS, Take 1 mg by mouth at bedtime as needed (sleep). , Disp: , Rfl:  .  meloxicam (MOBIC) 7.5 MG tablet, Take 1 tablet (7.5 mg total) by mouth daily., Disp: 30 tablet, Rfl: 0 .  pantoprazole (PROTONIX) 20 MG tablet, Take 1 tablet (20 mg total) by mouth daily., Disp: 90 tablet, Rfl: 3 .  pravastatin (PRAVACHOL) 80 MG tablet, Take 1 tablet (80 mg total) by mouth at bedtime., Disp: 90 tablet, Rfl: 3  Assessment/ Plan: 69 y.o. female   1. Chronic shoulder pain, unspecified laterality Heat and stretching exercises - meloxicam (MOBIC) 7.5 MG tablet; Take 1 tablet (7.5 mg total) by mouth daily.  Dispense: 30 tablet; Refill: 0   No follow-ups on file.  Continue all other maintenance medications as listed above.  Start time: 10:01 AM End time: 10:12 AM  Meds ordered this encounter  Medications  . meloxicam (MOBIC) 7.5 MG tablet    Sig: Take 1 tablet (7.5 mg total) by mouth daily.    Dispense:  30 tablet    Refill:  0    Order Specific Question:   Supervising Provider    Answer:   Janora Norlander [id number]    Particia Nearing PA-C [place] [phone]

## 2019-04-16 ENCOUNTER — Ambulatory Visit (INDEPENDENT_AMBULATORY_CARE_PROVIDER_SITE_OTHER): Payer: Medicare Other | Admitting: *Deleted

## 2019-04-16 DIAGNOSIS — M1990 Unspecified osteoarthritis, unspecified site: Secondary | ICD-10-CM | POA: Diagnosis not present

## 2019-04-16 DIAGNOSIS — H9190 Unspecified hearing loss, unspecified ear: Secondary | ICD-10-CM

## 2019-04-16 DIAGNOSIS — M25519 Pain in unspecified shoulder: Secondary | ICD-10-CM

## 2019-04-16 DIAGNOSIS — G8929 Other chronic pain: Secondary | ICD-10-CM

## 2019-04-16 NOTE — Chronic Care Management (AMB) (Signed)
Chronic Care Management   Follow Up Note   04/16/2019 Name: Joyce Ross MRN: [id number] DOB: 17-Apr-1950  Referred by: Terald Sleeper, PA-C Reason for referral : Chronic Care Management (RN follow up)   Joyce Ross is a 69 y.o. year old female who is a primary care patient of Terald Sleeper, PA-C. The CCM team was consulted for assistance with chronic disease management and care coordination needs.    Review of patient status, including review of consultants reports, relevant laboratory and other test results, and collaboration with appropriate care team members and the patient's provider was performed as part of comprehensive patient evaluation and provision of chronic care management services.      Outpatient Encounter Medications as of 04/16/2019  Medication Sig  . acetaminophen (TYLENOL) 325 MG tablet Take 325 mg by mouth every 6 (six) hours as needed for moderate pain.   Marland Kitchen albuterol (PROVENTIL HFA;VENTOLIN HFA) 108 (90 Base) MCG/ACT inhaler Inhale 1-2 puffs into the lungs every 6 (six) hours as needed for wheezing or shortness of breath.  . budesonide-formoterol (SYMBICORT) 80-4.5 MCG/ACT inhaler Inhale 2 puffs into the lungs 2 (two) times daily. (Patient taking differently: Inhale 2 puffs into the lungs 2 (two) times daily. )  . cetirizine (ZYRTEC) 10 MG tablet Take 1 tablet (10 mg total) by mouth daily.  . citalopram (CELEXA) 40 MG tablet Take 1 tablet (40 mg total) by mouth daily.  . cyclobenzaprine (FLEXERIL) 10 MG tablet Take 1 tablet (10 mg total) by mouth 3 (three) times daily as needed. for muscle spams  . fluticasone (FLONASE) 50 MCG/ACT nasal spray Place 2 sprays into both nostrils daily.  Marland Kitchen gabapentin (NEURONTIN) 300 MG capsule TAKE ONE CAPSULE BY MOUTH three TIMES A DAY  . ibuprofen (ADVIL) 200 MG tablet Take 400 mg by mouth every 6 (six) hours as needed.  . Melatonin 1 MG TABS Take 1 mg by mouth at bedtime as needed (sleep).   . meloxicam (MOBIC) 7.5 MG tablet  Take 1 tablet (7.5 mg total) by mouth daily.  . pantoprazole (PROTONIX) 20 MG tablet Take 1 tablet (20 mg total) by mouth daily.  . pravastatin (PRAVACHOL) 80 MG tablet Take 1 tablet (80 mg total) by mouth at bedtime.   No facility-administered encounter medications on file as of 04/16/2019.     RN Care Plan   . Hearing Aids (pt-stated)       Current Barriers:  Marland Kitchen Knowledge Deficits related to how to obtain affordable hearing aids  Nurse Case Manager Clinical Goal(s):  Marland Kitchen Over the next 60 days, patient will meet with RN Care Manager to address hearing loss and need for hearing evaluation and potential hearing aids  Interventions:  . Talked with patient regarding perceived need for hearing aids . Discussed potential insurance coverage . Care Guide referral to research affordable hearing aids  Patient Self Care Activities:  . Performs ADL's independently . Performs IADL's independently  Please see past updates related to this goal by clicking on the "Past Updates" button in the selected goal      . Shoulder Pain (pt-stated)       Right shoulder pain in patient with osteoarthritis  Current Barriers:  Marland Kitchen Knowledge Deficits related to right shoulder pain treatment  Nurse Case Manager Clinical Goal(s):  Marland Kitchen Over the next 30 days, patient will work with Consulting civil engineer and PCP to address needs related to right shoulder pain s/p fall on Thanksgiving  Interventions:  . Chart reviewed o  Patient had visit with PCP on 03/26/2019 . Talked with patient by telephone  o Continues to have pain and is limiting use of right arm . Appt scheduled with PCP for 05/04/2019 . Encouraged patient to reach out to PCP [phone] with any new or worsening symptoms . Provided CCM contact information and encouraged to reach out as needed  Patient Self Care Activities:  . Performs ADL's independently . Performs IADL's independently   Please see past updates related to this goal by clicking on the "Past  Updates" button in the selected goal      . Low Back Pain       Low back pain in patient with osteoarthritis  Current Barriers:  . Chronic Disease Management support and education needs related to osteoarthritis and low back pain  Nurse Case Manager Clinical Goal(s):  Marland Kitchen Over the next 30 days, patient will work with Consulting civil engineer and PCP to address needs related to low back pain.   Interventions:  . Evaluation of current treatment plan related to low back pain and patient's adherence to plan as established by provider. . Reviewed medications with patient and discussed NSAID use . Discussed plans with patient for ongoing care management follow up and provided patient with direct contact information for care management team . Encouraged to reach out to PCP with any new or worsening symptoms  Patient Self Care Activities:  . Performs ADL's independently  Please see past updates related to this goal by clicking on the "Past Updates" button in the selected goal          Plan:   The care management team will reach out to the patient again over the next 45 days.    Chong Sicilian, BSN, RN-BC Embedded Chronic Care Manager [facility] / [place] [facility] Direct Dial: [phone]

## 2019-04-16 NOTE — Patient Instructions (Addendum)
Visit Information  Goals Addressed            This Visit's Progress     Patient Stated   . Hearing Aids (pt-stated)       Current Barriers:  Marland Kitchen Knowledge Deficits related to how to obtain affordable hearing aids  Nurse Case Manager Clinical Goal(s):  Marland Kitchen Over the next 60 days, patient will meet with RN Care Manager to address hearing loss and need for hearing evaluation and potential hearing aids  Interventions:  . Talked with patient regarding perceived need for hearing aids . Discussed potential insurance coverage . Care Guide referral to research affordable hearing aids  Patient Self Care Activities:  . Performs ADL's independently . Performs IADL's independently  Please see past updates related to this goal by clicking on the "Past Updates" button in the selected goal      . Shoulder Pain (pt-stated)       Right shoulder pain in patient with osteoarthritis  Current Barriers:  Marland Kitchen Knowledge Deficits related to right shoulder pain treatment  Nurse Case Manager Clinical Goal(s):  Marland Kitchen Over the next 30 days, patient will work with Consulting civil engineer and PCP to address needs related to right shoulder pain s/p fall on Thanksgiving  Interventions:  . Chart reviewed o Patient had visit with PCP on 03/26/2019 . Talked with patient by telephone  o Continues to have pain and is limiting use of right arm . Appt scheduled with PCP for 05/04/2019 . Encouraged patient to reach out to PCP [phone] with any new or worsening symptoms . Provided CCM contact information and encouraged to reach out as needed  Patient Self Care Activities:  . Performs ADL's independently . Performs IADL's independently   Please see past updates related to this goal by clicking on the "Past Updates" button in the selected goal        Other   . Low Back Pain       Low back pain in patient with osteoarthritis  Current Barriers:  . Chronic Disease Management support and education needs related to  osteoarthritis and low back pain  Nurse Case Manager Clinical Goal(s):  Marland Kitchen Over the next 30 days, patient will work with Consulting civil engineer and PCP to address needs related to low back pain.   Interventions:  . Evaluation of current treatment plan related to low back pain and patient's adherence to plan as established by provider. . Reviewed medications with patient and discussed NSAID use . Discussed plans with patient for ongoing care management follow up and provided patient with direct contact information for care management team . Encouraged to reach out to PCP with any new or worsening symptoms  Patient Self Care Activities:  . Performs ADL's independently  Please see past updates related to this goal by clicking on the "Past Updates" button in the selected goal         The patient verbalized understanding of instructions provided today and declined a print copy of patient instruction materials.   Follow-up Plan The care management team will reach out to the patient again over the next 45 days.   Chong Sicilian, BSN, RN-BC Embedded Chronic Care Manager [facility] Direct Dial: [phone]

## 2019-04-17 ENCOUNTER — Telehealth: Payer: Self-pay

## 2019-04-17 NOTE — Telephone Encounter (Signed)
04/17/2019 Left message on voicemail for patient to return my call regarding referral being placed with [facility] for assistance with hearing aids.   04/17/2019 Spoke with Bridgett at [employer] she sent email referral to [employer] in [place] regarding financial assistance with hearing aids. The [employer] in [place] should contact the patient by 04/27/2019.  I will follow up with patient next week if she has not been contacted I will contact the national office and they will send a referral to another office.  Ambrose Mantle [phone]

## 2019-04-25 ENCOUNTER — Telehealth: Payer: Self-pay

## 2019-04-25 NOTE — Telephone Encounter (Signed)
04/25/2019 Left message on voicemail for patient to return my call regarding referral being placed with [facility] for assistance with hearing aids. Ambrose Mantle [phone]

## 2019-05-04 ENCOUNTER — Ambulatory Visit (INDEPENDENT_AMBULATORY_CARE_PROVIDER_SITE_OTHER): Payer: Medicare Other

## 2019-05-04 ENCOUNTER — Ambulatory Visit (INDEPENDENT_AMBULATORY_CARE_PROVIDER_SITE_OTHER): Payer: Medicare Other | Admitting: Nurse Practitioner

## 2019-05-04 ENCOUNTER — Telehealth: Payer: Self-pay

## 2019-05-04 ENCOUNTER — Ambulatory Visit: Payer: Medicare Other | Admitting: Physician Assistant

## 2019-05-04 ENCOUNTER — Other Ambulatory Visit: Payer: Self-pay

## 2019-05-04 ENCOUNTER — Encounter: Payer: Self-pay | Admitting: Nurse Practitioner

## 2019-05-04 VITALS — BP 134/80 | HR 83 | Temp 98.0°F | Resp 20 | Ht <= 58 in | Wt 178.0 lb

## 2019-05-04 DIAGNOSIS — M25511 Pain in right shoulder: Secondary | ICD-10-CM | POA: Diagnosis not present

## 2019-05-04 MED ORDER — CELECOXIB 200 MG PO CAPS: 200.0000 mg | ORAL_CAPSULE | Freq: Two times a day (BID) | ORAL | 1 refills | Status: DC

## 2019-05-04 NOTE — Patient Instructions (Signed)
Shoulder Pain Many things can cause shoulder pain, including:  An injury.  Moving the shoulder in the same way again and again (overuse).  Joint pain (arthritis). Pain can come from:  Swelling and irritation (inflammation) of any part of the shoulder.  An injury to the shoulder joint.  An injury to: ? Tissues that connect muscle to bone (tendons). ? Tissues that connect bones to each other (ligaments). ? Bones. Follow these instructions at home: Watch for changes in your symptoms. Let your doctor know about them. Follow these instructions to help with your pain. If you have a sling:  Wear the sling as told by your doctor. Remove it only as told by your doctor.  Loosen the sling if your fingers: ? Tingle. ? Become numb. ? Turn cold and blue.  Keep the sling clean.  If the sling is not waterproof: ? Do not let it get wet. ? Take the sling off when you shower or bathe. Managing pain, stiffness, and swelling   If told, put ice on the painful area: ? Put ice in a plastic bag. ? Place a towel between your skin and the bag. ? Leave the ice on for 20 minutes, 2-3 times a day. Stop putting ice on if it does not help with the pain.  Squeeze a soft ball or a foam pad as much as possible. This prevents swelling in the shoulder. It also helps to strengthen the arm. General instructions  Take over-the-counter and prescription medicines only as told by your doctor.  Keep all follow-up visits as told by your doctor. This is important. Contact a doctor if:  Your pain gets worse.  Medicine does not help your pain.  You have new pain in your arm, hand, or fingers. Get help right away if:  Your arm, hand, or fingers: ? Tingle. ? Are numb. ? Are swollen. ? Are painful. ? Turn white or blue. Summary  Shoulder pain can be caused by many things. These include injury, moving the shoulder in the same away again and again, and joint pain.  Watch for changes in your symptoms.  Let your doctor know about them.  This condition may be treated with a sling, ice, and pain medicine.  Contact your doctor if the pain gets worse or you have new pain. Get help right away if your arm, hand, or fingers tingle or get numb, swollen, or painful.  Keep all follow-up visits as told by your doctor. This is important. This information is not intended to replace advice given to you by your health care provider. Make sure you discuss any questions you have with your health care provider. Document Revised: 08/16/2017 Document Reviewed: 08/16/2017 Elsevier Patient Education  2020 [employer] Inc.  

## 2019-05-04 NOTE — Progress Notes (Signed)
   Subjective:    [name], female    DOB: Jul 04, 1950, 69 y.o.   MRN: [id number]   Chief Complaint: Continued right shoulder pain   HPI Patient comes in today c/o continued shoulder pain. She was seen on 03/26/19 after falling and injurying her shoulder. She was given mobic. She says her shoulder pain has continued. She says that right shoulder is still hurting with some movement. Describes pain as a stabbing pain. Rates pain 0/10 wen sitting still but will go up to a 4-5/10 with movement. The mobic made her light headed so she has not been taking it. She has been taking tylenol and that has not helped.   Review of Systems  Constitutional: Negative for diaphoresis.  Eyes: Negative for pain.  Respiratory: Negative for shortness of breath.   Cardiovascular: Negative for chest pain, palpitations and leg swelling.  Gastrointestinal: Negative for abdominal pain.  Endocrine: Negative for polydipsia.  Skin: Negative for rash.  Neurological: Negative for dizziness, weakness and headaches.  Hematological: Does not bruise/bleed easily.  All other systems reviewed and are negative.      Objective:   Physical Exam Vitals and nursing note reviewed.  Constitutional:      Appearance: Normal appearance.  Cardiovascular:     Rate and Rhythm: Normal rate and regular rhythm.     Heart sounds: Normal heart sounds.  Pulmonary:     Breath sounds: Normal breath sounds.  Abdominal:     Tenderness: There is left CVA tenderness.  Musculoskeletal:     Comments: FROM of right shoulder with pain on internal rotation and extension. Grips equal bil Point tenderness along top of shoulder  Skin:    General: Skin is warm.  Neurological:     General: No focal deficit present.     Mental Status: She is alert and oriented to person, place, and time.    BP 134/80   Pulse 83   Temp 98 F (36.7 C) (Temporal)   Resp 20   Ht 4\' 10"  (1.473 m)   Wt 178 lb (80.7 kg)   SpO2 96%   BMI  37.20 kg/m   Xray- right shoulder- no acute findings.      Assessment & Plan:  Joyce Ross in today with chief complaint of Continued right shoulder pain   1. Acute pain of right shoulder Moist heat Rest If o better in 2 weeks will do ortho referral - DG Shoulder Right; Future - celecoxib (CELEBREX) 200 MG capsule; Take 1 capsule (200 mg total) by mouth 2 (two) times daily.  Dispense: 60 capsule; Refill: 1    The above assessment and management plan was discussed with the patient. The patient verbalized understanding of and has agreed to the management plan. Patient is aware to call the clinic if symptoms persist or worsen. Patient is aware when to return to the clinic for a follow-up visit. Patient educated on when it is appropriate to go to the emergency department.   Mary-Margaret Hassell Done, FNP

## 2019-05-04 NOTE — Telephone Encounter (Signed)
05/04/2019 Spoke with patient she thinks she may have missed the call from the [facility] because she had her phone block unknown number.  I gave her the [employer] number to contact and they can confirm her referral to the local [facility].  Patient does not have any other needs.  Closing referral. Ambrose Mantle [phone]

## 2019-05-27 IMAGING — US US RENAL
1 series · 14 of 25 positions shown · non-contrast
Comparison: Chest CT 09/28/2017

CLINICAL DATA: 66-year-old female with partially visible left
hydronephrosis and nephrolithiasis on chest CT last month.

EXAM:
RENAL / URINARY TRACT ULTRASOUND COMPLETE

[Series 1: us renal · 14 of 53 slices shown]
[im 1/53]
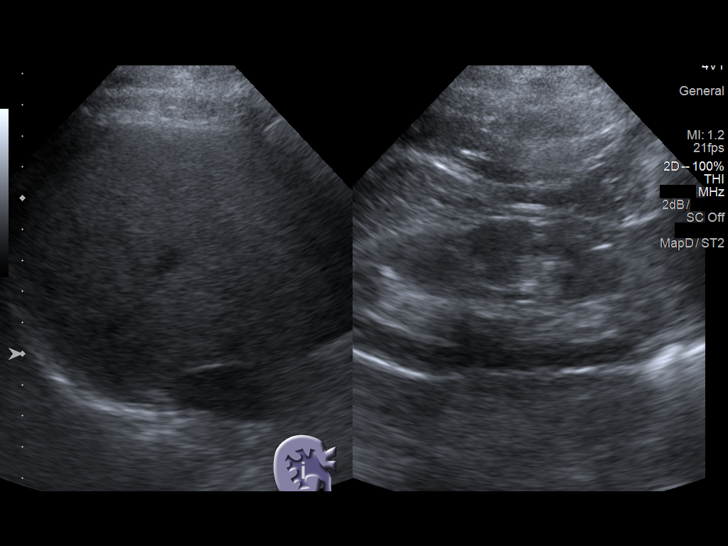
[im 5/53]
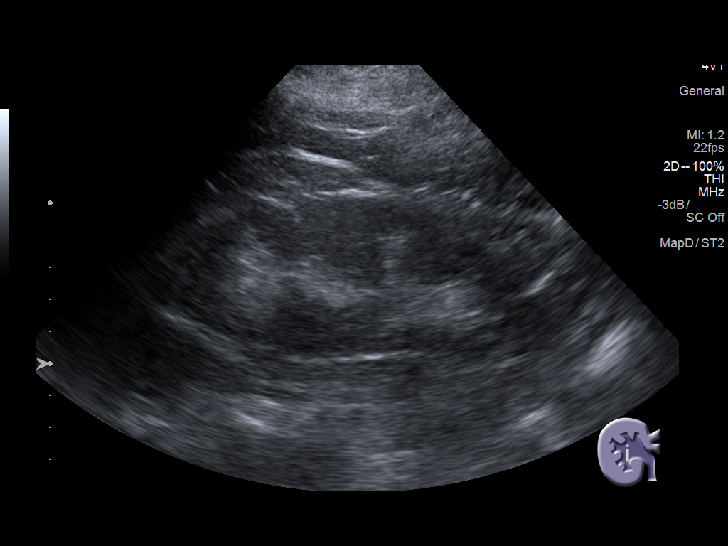
[im 9/53]
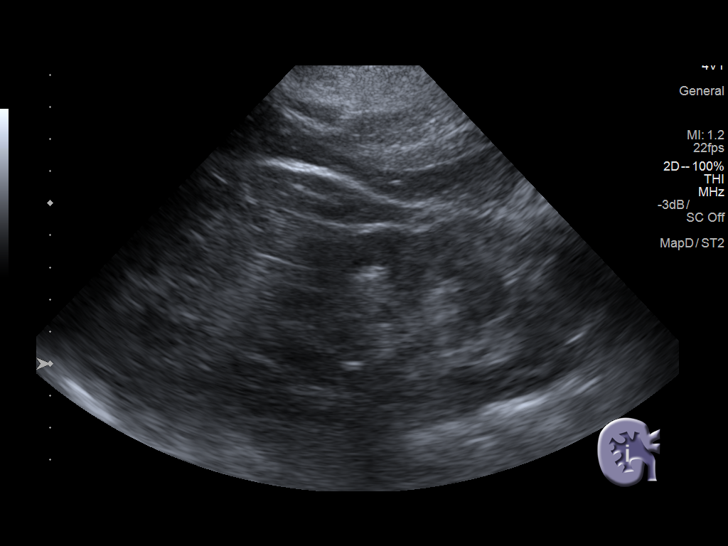
[im 14/53]
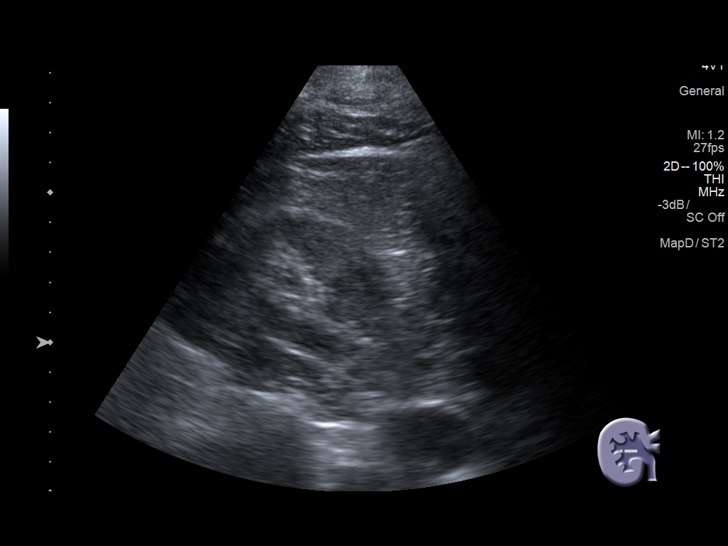
[im 18/53]
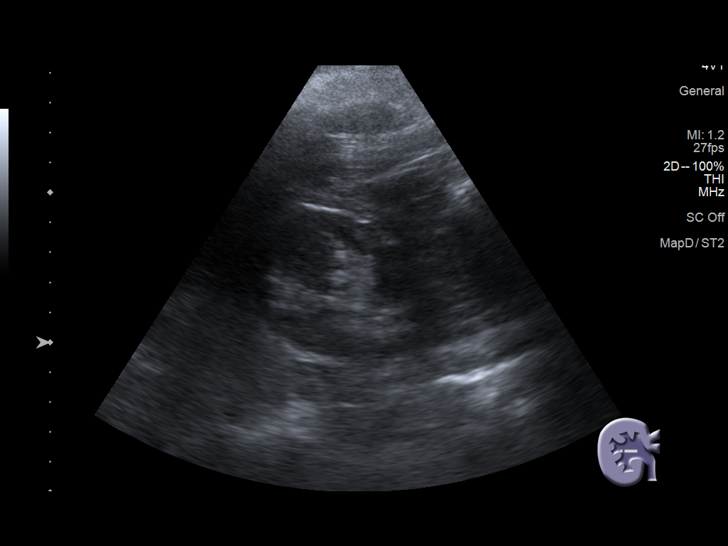
[im 20/53]
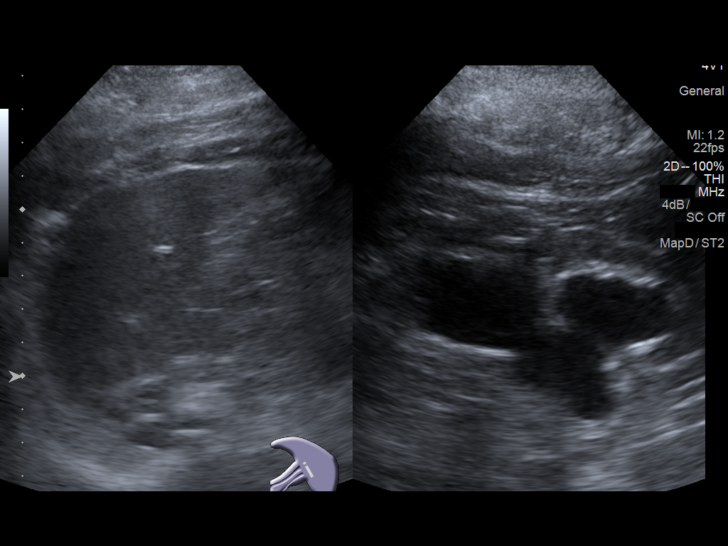
[im 24/53]
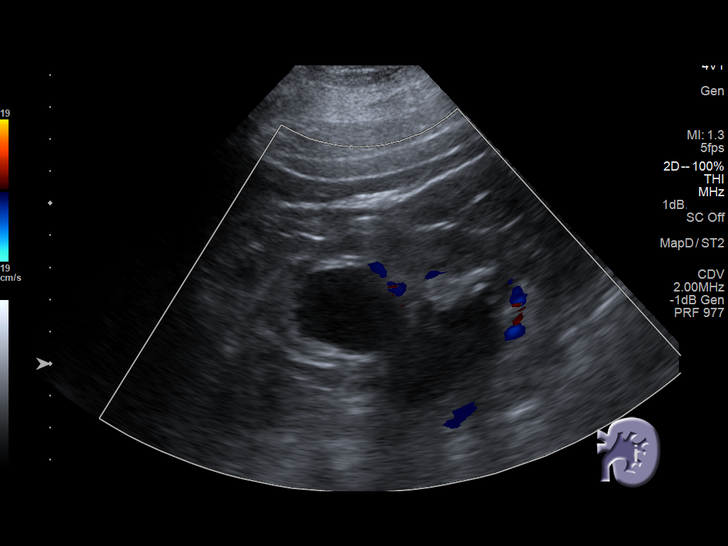
[im 29/53]
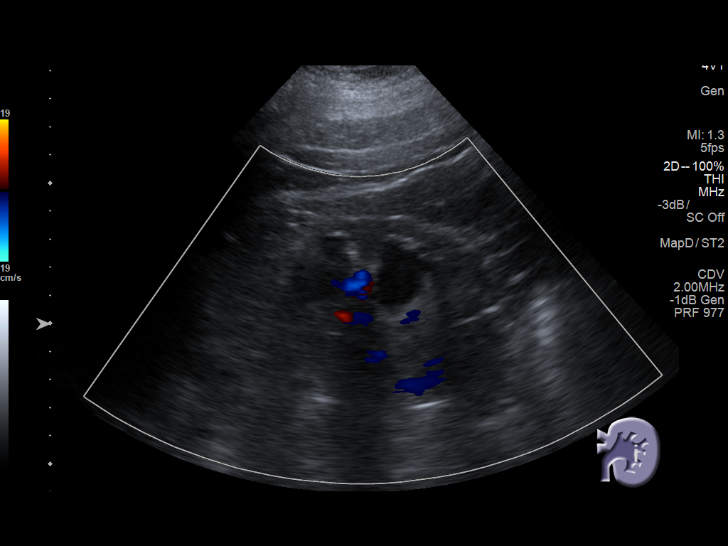
[im 33/53]
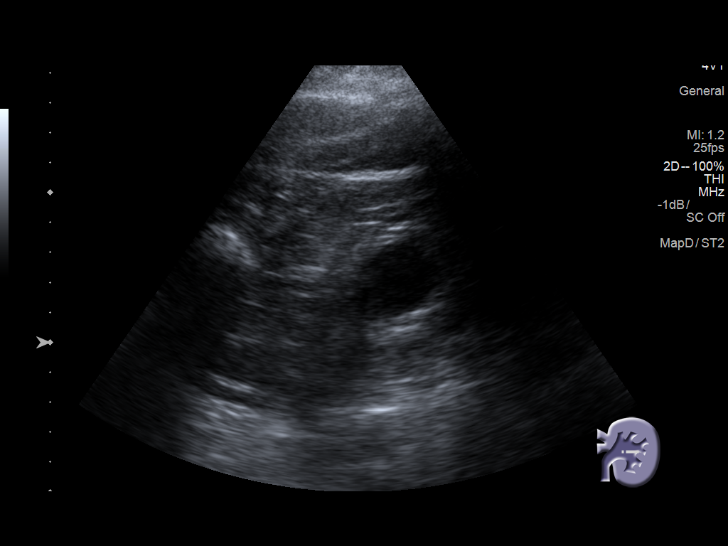
[im 35/53]
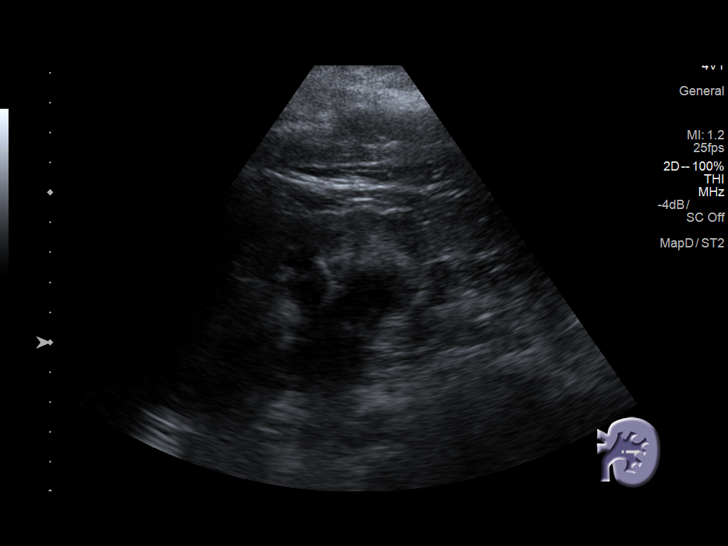
[im 40/53]
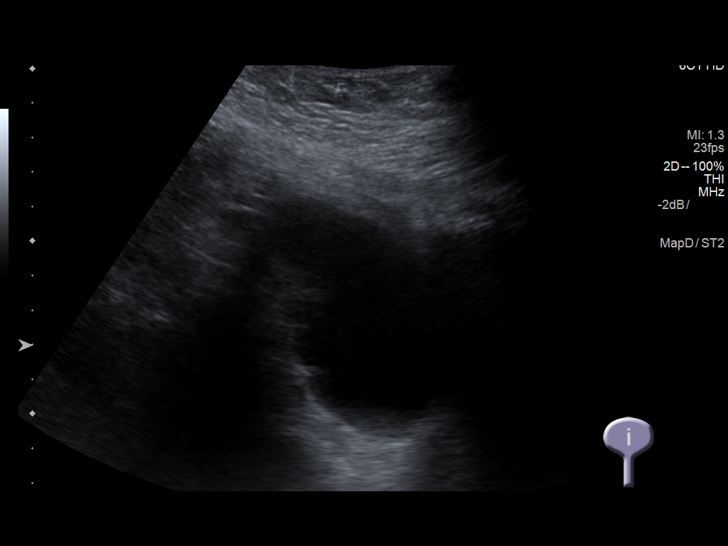
[im 44/53]
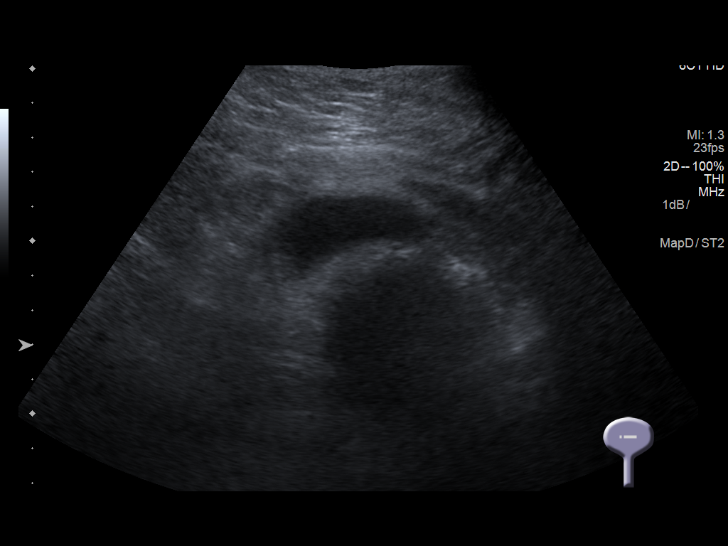
[im 48/53]
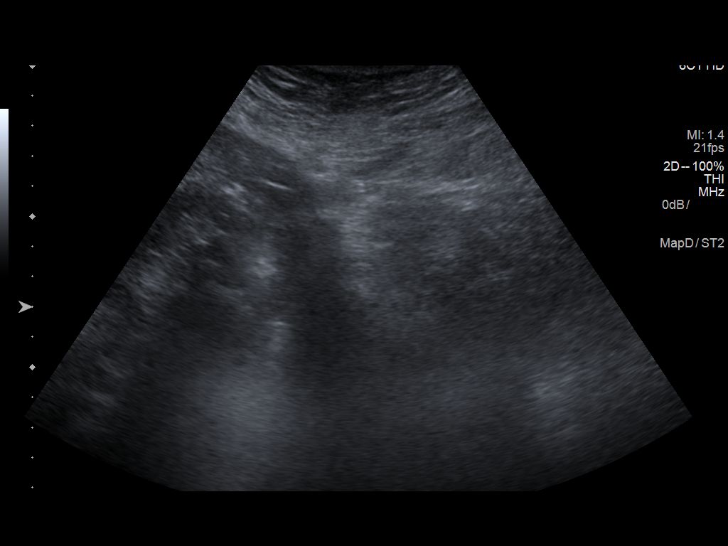
[im 53/53]
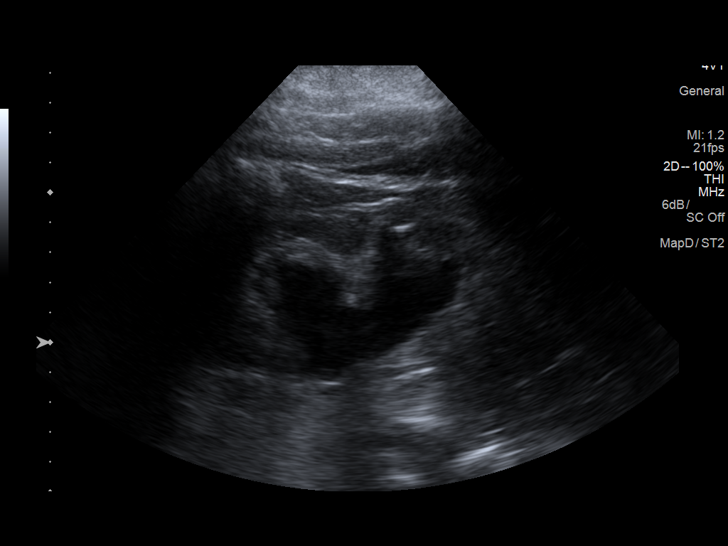

[14 of 25 positions shown; findings below may reference images not displayed]

FINDINGS: Right Kidney:

Length: 11.9 centimeters. Echogenicity within normal limits. No mass
or hydronephrosis visualized.

Left Kidney:

Length: 11.0 centimeters. Severe hydronephrosis (image 22). This
appears similar to the partially visible left kidney in [REDACTED]. The
left renal pelvis is enlarged, but no obstructing etiology is
evident. No nephrolithiasis is evident by ultrasound.

Bladder:

Appears normal for degree of bladder distention.  No urinary debris.
IMPRESSION: 1. Persistent and severe appearing left hydronephrosis, but
obstructing etiology is not evident. Follow-up noncontrast CT
Abdomen and Pelvis would best evaluate further.
2. Normal right kidney and unremarkable urinary bladder.

## 2019-06-05 ENCOUNTER — Other Ambulatory Visit: Payer: Self-pay | Admitting: *Deleted

## 2019-06-05 DIAGNOSIS — G629 Polyneuropathy, unspecified: Secondary | ICD-10-CM

## 2019-06-05 DIAGNOSIS — M545 Low back pain, unspecified: Secondary | ICD-10-CM

## 2019-06-05 MED ORDER — CYCLOBENZAPRINE HCL 10 MG PO TABS: 10.0000 mg | ORAL_TABLET | Freq: Three times a day (TID) | ORAL | 0 refills | Status: DC | PRN

## 2019-06-05 MED ORDER — GABAPENTIN 300 MG PO CAPS: ORAL_CAPSULE | ORAL | 0 refills | Status: DC

## 2019-06-05 NOTE — Telephone Encounter (Signed)
Please make sure patient has OV to establish care.  Ok to refill x1 month

## 2019-06-11 ENCOUNTER — Other Ambulatory Visit: Payer: Self-pay | Admitting: *Deleted

## 2019-06-11 DIAGNOSIS — G629 Polyneuropathy, unspecified: Secondary | ICD-10-CM

## 2019-06-14 ENCOUNTER — Other Ambulatory Visit: Payer: Self-pay | Admitting: Family Medicine

## 2019-06-14 DIAGNOSIS — G629 Polyneuropathy, unspecified: Secondary | ICD-10-CM

## 2019-06-14 NOTE — Telephone Encounter (Signed)
  Prescription Request  06/14/2019  What is the name of the medication or equipment? Gabapentin  Have you contacted your pharmacy to request a refill? (if applicable) Yes  Which pharmacy would you like this sent to? [facility], [place]  Pt says she has spoken to pharmacy and they said they have sent [place] requests to have this medicine refilled because they havent received permission for refill.    Patient notified that their request is being sent to the clinical staff for review and that they should receive a response within 2 business days.

## 2019-06-14 NOTE — Telephone Encounter (Signed)
You have not written please advise

## 2019-06-15 MED ORDER — GABAPENTIN 300 MG PO CAPS: ORAL_CAPSULE | ORAL | 0 refills | Status: DC

## 2019-07-05 ENCOUNTER — Ambulatory Visit (INDEPENDENT_AMBULATORY_CARE_PROVIDER_SITE_OTHER): Payer: Medicare Other | Admitting: Family Medicine

## 2019-07-05 ENCOUNTER — Other Ambulatory Visit: Payer: Self-pay

## 2019-07-05 ENCOUNTER — Encounter: Payer: Self-pay | Admitting: Family Medicine

## 2019-07-05 VITALS — BP 121/51 | HR 63 | Temp 98.4°F | Ht <= 58 in | Wt 184.4 lb

## 2019-07-05 DIAGNOSIS — K219 Gastro-esophageal reflux disease without esophagitis: Secondary | ICD-10-CM

## 2019-07-05 DIAGNOSIS — G629 Polyneuropathy, unspecified: Secondary | ICD-10-CM

## 2019-07-05 DIAGNOSIS — G8929 Other chronic pain: Secondary | ICD-10-CM

## 2019-07-05 DIAGNOSIS — M1990 Unspecified osteoarthritis, unspecified site: Secondary | ICD-10-CM

## 2019-07-05 DIAGNOSIS — E785 Hyperlipidemia, unspecified: Secondary | ICD-10-CM

## 2019-07-05 DIAGNOSIS — I10 Essential (primary) hypertension: Secondary | ICD-10-CM

## 2019-07-05 DIAGNOSIS — M5442 Lumbago with sciatica, left side: Secondary | ICD-10-CM

## 2019-07-05 DIAGNOSIS — R7303 Prediabetes: Secondary | ICD-10-CM

## 2019-07-05 DIAGNOSIS — F331 Major depressive disorder, recurrent, moderate: Secondary | ICD-10-CM

## 2019-07-05 DIAGNOSIS — F411 Generalized anxiety disorder: Secondary | ICD-10-CM

## 2019-07-05 DIAGNOSIS — M5441 Lumbago with sciatica, right side: Secondary | ICD-10-CM

## 2019-07-05 MED ORDER — BUSPIRONE HCL 7.5 MG PO TABS: 7.5000 mg | ORAL_TABLET | Freq: Two times a day (BID) | ORAL | 2 refills | Status: DC

## 2019-07-05 NOTE — Progress Notes (Signed)
Assessment & Plan:  1-2. Chronic bilateral low back pain with bilateral sciatica/Osteoarthritis - Education provided on low back exercises.  Patient was advised to stop taking ibuprofen due to her kidney function.  She was encouraged to try Tylenol 1,000 mg TID as needed, Lidoderm patches, Biofreeze, and a heating pad.  We did discuss that this was not enough to control her pain she would need to come back and we could try tramadol, but that she would need to complete a controlled substance agreement.  In anticipation of this I did go ahead and get a UDS today. - DG Lumbar Spine 2-3 Views - Compliance Drug Analysis, Ur  3. Pre-diabetes - Bayer DCA Hb A1c Waived  4. Neuropathy - Well controlled on current regimen.   5. Hyperlipidemia, unspecified hyperlipidemia type - No lipid panel since 2018.  Labs today to assess for control. - CMP14+EGFR - Lipid panel  6-7. Generalized anxiety disorder/Major depressive disorder, recurrent, moderate (HCC) - Continue Celexa 40 mg once daily.  Started patient on buspirone 7.5 mg twice daily. - busPIRone (BUSPAR) 7.5 MG tablet; Take 1 tablet (7.5 mg total) by mouth 2 (two) times daily.  Dispense: 60 tablet; Refill: 2  8. Essential hypertension - Well controlled on current regimen.   9. Gastroesophageal reflux disease, unspecified whether esophagitis present - Well controlled on current regimen.    Return for back pain, anxiety.  Hendricks Limes, MSN, APRN, FNP-C [facility]  Subjective:    [name], female    DOB: 08-Jun-1950, 69 y.o.   MRN: [id number]  Patient Care Team: Loman Brooklyn, FNP as PCP - General (Family Medicine) Ilean China, RN as Registered Nurse   Chief Complaint:  Chief Complaint  Patient presents with  . Establish Care    jones pt  . Back Pain    Patient states this has been ongoing.  . Medical Management of Chronic Issues    check up of chronic medical conditions     HPI: Joyce Ross is a 69 y.o. female presenting on 07/05/2019 for Establish Care (jones pt), Back Pain (Patient states this has been ongoing.), and Medical Management of Chronic Issues (check up of chronic medical conditions)  Back Pain: Patient presents for presents evaluation of low back pain that "works into both hips" and radiates down both legs.  Symptoms have been present many years but have gotten worse over the past 2 years.  Initial inciting event: none, but patient reports her pain is getting worse with her weight gain. Treatments so far initiated by patient: Ibuprofen 400 mg TID as needed, Flexeril, and she used to have Celebrex but she does not any longer.  Previous workup: none.   Patient reports she has been taking gabapentin for the neuropathy in her feet for the past 6 years.  Patient goes to [facility] urology.  She reports since she had a really bad kidney stone her kidney is swollen and only working at 17%.  Patient feels she is having a hard time with her anxiety.  She is taking Celexa 40 mg once daily and just does not feel it is enough.  GAD 7 : Generalized Anxiety Score 07/05/2019  Nervous, Anxious, on Edge 3  Control/stop worrying 2  Worry too much - different things 2  Trouble relaxing 3  Restless 3  Easily annoyed or irritable 3  Afraid - awful might happen 3  Total GAD 7 Score 19   Depression screen PHQ 2/9  07/05/2019 05/04/2019 10/27/2018  Decreased Interest 2 0 0  Down, Depressed, Hopeless 1 0 0  PHQ - 2 Score 3 0 0  Altered sleeping 0 - -  Tired, decreased energy 3 - -  Change in appetite 3 - -  Feeling bad or failure about yourself  3 - -  Trouble concentrating 1 - -  Moving slowly or fidgety/restless 0 - -  Suicidal thoughts 0 - -  PHQ-9 Score 13 - -  Difficult doing work/chores - - -    Social history:  Relevant past medical, surgical, family and social history reviewed and updated as indicated. Interim medical history since our last visit  reviewed.  Allergies and medications reviewed and updated.  DATA REVIEWED: CHART IN EPIC  ROS: Negative unless specifically indicated above in HPI.    Current Outpatient Medications:  .  acetaminophen (TYLENOL) 325 MG tablet, Take 325 mg by mouth every 6 (six) hours as needed for moderate pain. , Disp: , Rfl:  .  albuterol (PROVENTIL HFA;VENTOLIN HFA) 108 (90 Base) MCG/ACT inhaler, Inhale 1-2 puffs into the lungs every 6 (six) hours as needed for wheezing or shortness of breath., Disp: , Rfl:  .  budesonide-formoterol (SYMBICORT) 80-4.5 MCG/ACT inhaler, Inhale 2 puffs into the lungs 2 (two) times daily. (Patient taking differently: Inhale 2 puffs into the lungs 2 (two) times daily. ), Disp: 1 Inhaler, Rfl: 12 .  cetirizine (ZYRTEC) 10 MG tablet, Take 1 tablet (10 mg total) by mouth daily., Disp: 90 tablet, Rfl: 3 .  citalopram (CELEXA) 40 MG tablet, Take 1 tablet (40 mg total) by mouth daily., Disp: 90 tablet, Rfl: 3 .  cyclobenzaprine (FLEXERIL) 10 MG tablet, Take 1 tablet (10 mg total) by mouth 3 (three) times daily as needed. for muscle spams, Disp: 60 tablet, Rfl: 0 .  fluticasone (FLONASE) 50 MCG/ACT nasal spray, Place 2 sprays into both nostrils daily., Disp: 16 g, Rfl: 6 .  gabapentin (NEURONTIN) 300 MG capsule, TAKE ONE CAPSULE BY MOUTH three TIMES A DAY, Disp: 90 capsule, Rfl: 0 .  Melatonin 1 MG TABS, Take 1 mg by mouth at bedtime as needed (sleep). , Disp: , Rfl:  .  pantoprazole (PROTONIX) 20 MG tablet, Take 1 tablet (20 mg total) by mouth daily., Disp: 90 tablet, Rfl: 3 .  pravastatin (PRAVACHOL) 80 MG tablet, Take 1 tablet (80 mg total) by mouth at bedtime., Disp: 90 tablet, Rfl: 3 .  busPIRone (BUSPAR) 7.5 MG tablet, Take 1 tablet (7.5 mg total) by mouth 2 (two) times daily., Disp: 60 tablet, Rfl: 2   Allergies  Allergen Reactions  . Adhesive [Tape] Other (See Comments)    "leaves red places on skin"  . Metoprolol     nightmares  . Penicillins Other (See Comments)     Thrush Has patient had a PCN reaction causing immediate rash, facial/tongue/throat swelling, SOB or lightheadedness with hypotension: No Has patient had a PCN reaction causing severe rash involving mucus membranes or skin necrosis: No Has patient had a PCN reaction that required hospitalization: No Has patient had a PCN reaction occurring within the last 10 years: Yes If all of the above answers are "NO", then may proceed with Cephalosporin use.   . Radish [Raphanus Sativus] Hives  . Yellow Jacket Venom Itching and Rash   Past Medical History:  Diagnosis Date  . Coronary artery calcification seen on CAT scan 09/28/2017  . Dyspnea   . Full dentures   . GERD (gastroesophageal reflux disease)   .  Heart murmur    found a couple of years ago no problems  . History of kidney stones   . History of recurrent UTIs   . Hyperlipidemia   . Left-sided Bell's palsy 1990s   residual left facial drooping  . Mild intermittent asthma    followed by pcp  . Numbness and tingling in left arm    residual from cervical neck surgery going from shoulder down to hand, not completely  . OA (osteoarthritis)    hands, spine, hips, right leg  . Peripheral neuropathy    feet  . PONV (postoperative nausea and vomiting)   . Thoracic ascending aortic aneurysm ([place])    CT 09-28-2017--- 4.3 cm  . Thyroid nodule    ultrasound w/ bx 09/ 2019,  per pt was benign  . White coat syndrome without hypertension     Past Surgical History:  Procedure Laterality Date  . ANTERIOR CERVICAL DECOMP/DISCECTOMY FUSION  1980s  . CARPAL TUNNEL RELEASE Bilateral 2000  . CESAREAN SECTION  x2 last one 71  . CYSTOSCOPY WITH RETROGRADE PYELOGRAM, URETEROSCOPY AND STENT PLACEMENT Left 01/11/2018   Procedure: CYSTOSCOPY, URETEROSCOPY AND STENT PLACEMENT;  Surgeon: Lucas Mallow, MD;  Location: [facility];  Service: Urology;  Laterality: Left;  . CYSTOSCOPY/URETEROSCOPY/HOLMIUM LASER/STENT PLACEMENT Left 07/31/2018   Procedure:  CYSTOSCOPY/URETEROSCOPY/HOLMIUM LASER/STENT PLACEMENT;  Surgeon: Lucas Mallow, MD;  Location: [facility];  Service: Urology;  Laterality: Left;  . EXTRACORPOREAL SHOCK WAVE LITHOTRIPSY Left 12/05/2017   Procedure: LEFT EXTRACORPOREAL SHOCK WAVE LITHOTRIPSY (ESWL);  Surgeon: Franchot Gallo, MD;  Location: [facility];  Service: Urology;  Laterality: Left;  . EXTRACORPOREAL SHOCK WAVE LITHOTRIPSY  1990s  . HOLMIUM LASER APPLICATION Left 18/56/3149   Procedure: HOLMIUM LASER APPLICATION;  Surgeon: Lucas Mallow, MD;  Location: [facility];  Service: Urology;  Laterality: Left;  . MOUTH SURGERY     all teeth pulled    Social History   Socioeconomic History  . Marital status: Widowed    Spouse name: Not on file  . Number of children: 2  . Years of education: 12th Grade  . Highest education level: High school graduate  Occupational History  . Occupation: retired  Tobacco Use  . Smoking status: Former Smoker    Years: 44.00    Types: Cigarettes    Quit date: 10/09/2012    Years since quitting: 6.7  . Smokeless tobacco: Never Used  . Tobacco comment: since age 51  Substance and Sexual Activity  . Alcohol use: No  . Drug use: No    Comment: per pt last used "pot" 1970s  . Sexual activity: Not Currently    Birth control/protection: Post-menopausal  Other Topics Concern  . Not on file  Social History Narrative  . Not on file   Social Determinants of Health   Financial Resource Strain: Low Risk   . Difficulty of Paying Living Expenses: Not very hard  Food Insecurity: No Food Insecurity  . Worried About Charity fundraiser in the Last Year: Never true  . Ran Out of Food in the Last Year: Never true  Transportation Needs: No Transportation Needs  . Lack of Transportation (Medical): No  . Lack of Transportation (Non-Medical): No  Physical Activity: Insufficiently Active  . Days of Exercise per Week: 7 days  . Minutes of Exercise per Session: 10 min  Stress: No Stress Concern  Present  . Feeling of Stress : Not at all  Social Connections: Moderately Isolated  .  Frequency of Communication with Friends and Family: More than three times a week  . Frequency of Social Gatherings with Friends and Family: More than three times a week  . Attends Religious Services: Never  . Active Member of Clubs or Organizations: No  . Attends Archivist Meetings: Never  . Marital Status: Widowed  Intimate Partner Violence: Not At Risk  . Fear of Current or Ex-Partner: No  . Emotionally Abused: No  . Physically Abused: No  . Sexually Abused: No        Objective:    BP (!) 121/51   Pulse 63   Temp 98.4 F (36.9 C) (Temporal)   Ht _0  (1.473 m)   Wt 184 lb 6.4 oz (83.6 kg)   SpO2 94%   BMI 38.54 kg/m   Wt Readings from Last 3 Encounters:  07/05/19 184 lb 6.4 oz (83.6 kg)  05/04/19 178 lb (80.7 kg)  07/26/18 174 lb (78.9 kg)    Physical Exam Vitals reviewed.  Constitutional:      General: She is not in acute distress.    Appearance: Normal appearance. She is obese. She is not ill-appearing, toxic-appearing or diaphoretic.  HENT:     Head: Normocephalic and atraumatic.  Eyes:     General: No scleral icterus.       Right eye: No discharge.        Left eye: No discharge.     Conjunctiva/sclera: Conjunctivae normal.  Cardiovascular:     Rate and Rhythm: Normal rate and regular rhythm.     Heart sounds: Normal heart sounds. No murmur. No friction rub. No gallop.   Pulmonary:     Effort: Pulmonary effort is normal. No respiratory distress.     Breath sounds: Normal breath sounds. No stridor. No wheezing, rhonchi or rales.  Musculoskeletal:        General: Normal range of motion.     Cervical back: Normal range of motion.     Lumbar back: Tenderness present.  Skin:    General: Skin is warm and dry.     Capillary Refill: Capillary refill takes less than 2 seconds.  Neurological:     General: No focal deficit present.     Mental Status: She is alert  and oriented to person, place, and time. Mental status is at baseline.  Psychiatric:        Mood and Affect: Mood normal.        Behavior: Behavior normal.        Thought Content: Thought content normal.        Judgment: Judgment normal.    No results found for: TSH Lab Results  Component Value Date   WBC 9.2 07/26/2018   HGB 12.8 07/26/2018   HCT 40.4 07/26/2018   MCV 99.0 07/26/2018   PLT 296 07/26/2018   Lab Results  Component Value Date   NA 145 (H) 07/05/2019   K 4.5 07/05/2019   CO2 26 07/05/2019   GLUCOSE 74 07/05/2019   BUN 25 07/05/2019   CREATININE 1.07 (H) 07/05/2019   BILITOT <0.2 07/05/2019   ALKPHOS 130 (H) 07/05/2019   AST 14 07/05/2019   ALT 13 07/05/2019   PROT 6.9 07/05/2019   ALBUMIN 3.9 07/05/2019   CALCIUM 9.0 07/05/2019   ANIONGAP 10 07/26/2018   Lab Results  Component Value Date   CHOL 160 07/05/2019   Lab Results  Component Value Date   HDL 52 07/05/2019   Lab Results  Component Value  Date   LDLCALC 94 07/05/2019   Lab Results  Component Value Date   TRIG 74 07/05/2019   Lab Results  Component Value Date   CHOLHDL 3.1 07/05/2019   Lab Results  Component Value Date   HGBA1C 6.3 07/05/2019

## 2019-07-05 NOTE — Patient Instructions (Addendum)
Stop taking Ibuprofen Tylenol 1,000 mg three times a day as needed. Lidoderm patches Biofreeze Heating pad   Low Back Sprain or Strain Rehab Ask your health care provider which exercises are safe for you. Do exercises exactly as told by your health care provider and adjust them as directed. It is normal to feel mild stretching, pulling, tightness, or discomfort as you do these exercises. Stop right away if you feel sudden pain or your pain gets worse. Do not begin these exercises until told by your health care provider. Stretching and range-of-motion exercises These exercises warm up your muscles and joints and improve the movement and flexibility of your back. These exercises also help to relieve pain, numbness, and tingling. Lumbar rotation  1. Lie on your back on a firm surface and bend your knees. 2. Straighten your arms out to your sides so each arm forms a 90-degree angle (right angle) with a side of your body. 3. Slowly move (rotate) both of your knees to one side of your body until you feel a stretch in your lower back (lumbar). Try not to let your shoulders lift off the floor. 4. Hold this position for __________ seconds. 5. Tense your abdominal muscles and slowly move your knees back to the starting position. 6. Repeat this exercise on the other side of your body. Repeat __________ times. Complete this exercise __________ times a day. Single knee to chest  1. Lie on your back on a firm surface with both legs straight. 2. Bend one of your knees. Use your hands to move your knee up toward your chest until you feel a gentle stretch in your lower back and buttock. ? Hold your leg in this position by holding on to the front of your knee. ? Keep your other leg as straight as possible. 3. Hold this position for __________ seconds. 4. Slowly return to the starting position. 5. Repeat with your other leg. Repeat __________ times. Complete this exercise __________ times a day. Prone  extension on elbows  1. Lie on your abdomen on a firm surface (prone position). 2. Prop yourself up on your elbows. 3. Use your arms to help lift your chest up until you feel a gentle stretch in your abdomen and your lower back. ? This will place some of your body weight on your elbows. If this is uncomfortable, try stacking pillows under your chest. ? Your hips should stay down, against the surface that you are lying on. Keep your hip and back muscles relaxed. 4. Hold this position for __________ seconds. 5. Slowly relax your upper body and return to the starting position. Repeat __________ times. Complete this exercise __________ times a day. Strengthening exercises These exercises build strength and endurance in your back. Endurance is the ability to use your muscles for a long time, even after they get tired. Pelvic tilt This exercise strengthens the muscles that lie deep in the abdomen. 1. Lie on your back on a firm surface. Bend your knees and keep your feet flat on the floor. 2. Tense your abdominal muscles. Tip your pelvis up toward the ceiling and flatten your lower back into the floor. ? To help with this exercise, you may place a small towel under your lower back and try to push your back into the towel. 3. Hold this position for __________ seconds. 4. Let your muscles relax completely before you repeat this exercise. Repeat __________ times. Complete this exercise __________ times a day. Alternating arm and leg raises  1. Get on your hands and knees on a firm surface. If you are on a hard floor, you may want to use padding, such as an exercise mat, to cushion your knees. 2. Line up your arms and legs. Your hands should be directly below your shoulders, and your knees should be directly below your hips. 3. Lift your left leg behind you. At the same time, raise your right arm and straighten it in front of you. ? Do not lift your leg higher than your hip. ? Do not lift your arm  higher than your shoulder. ? Keep your abdominal and back muscles tight. ? Keep your hips facing the ground. ? Do not arch your back. ? Keep your balance carefully, and do not hold your breath. 4. Hold this position for __________ seconds. 5. Slowly return to the starting position. 6. Repeat with your right leg and your left arm. Repeat __________ times. Complete this exercise __________ times a day. Abdominal set with straight leg raise  1. Lie on your back on a firm surface. 2. Bend one of your knees and keep your other leg straight. 3. Tense your abdominal muscles and lift your straight leg up, 4-6 inches (10-15 cm) off the ground. 4. Keep your abdominal muscles tight and hold this position for __________ seconds. ? Do not hold your breath. ? Do not arch your back. Keep it flat against the ground. 5. Keep your abdominal muscles tense as you slowly lower your leg back to the starting position. 6. Repeat with your other leg. Repeat __________ times. Complete this exercise __________ times a day. Single leg lower with bent knees 1. Lie on your back on a firm surface. 2. Tense your abdominal muscles and lift your feet off the floor, one foot at a time, so your knees and hips are bent in 90-degree angles (right angles). ? Your knees should be over your hips and your lower legs should be parallel to the floor. 3. Keeping your abdominal muscles tense and your knee bent, slowly lower one of your legs so your toe touches the ground. 4. Lift your leg back up to return to the starting position. ? Do not hold your breath. ? Do not let your back arch. Keep your back flat against the ground. 5. Repeat with your other leg. Repeat __________ times. Complete this exercise __________ times a day. Posture and body mechanics Good posture and healthy body mechanics can help to relieve stress in your body's tissues and joints. Body mechanics refers to the movements and positions of your body while you do  your daily activities. Posture is part of body mechanics. Good posture means:  Your spine is in its natural S-curve position (neutral).  Your shoulders are pulled back slightly.  Your head is not tipped forward. Follow these guidelines to improve your posture and body mechanics in your everyday activities. Standing   When standing, keep your spine neutral and your feet about hip width apart. Keep a slight bend in your knees. Your ears, shoulders, and hips should line up.  When you do a task in which you stand in one place for a long time, place one foot up on a stable object that is 2-4 inches (5-10 cm) high, such as a footstool. This helps keep your spine neutral. Sitting   When sitting, keep your spine neutral and keep your feet flat on the floor. Use a footrest, if necessary, and keep your thighs parallel to the floor. Avoid rounding your  shoulders, and avoid tilting your head forward.  When working at a desk or a computer, keep your desk at a height where your hands are slightly lower than your elbows. Slide your chair under your desk so you are close enough to maintain good posture.  When working at a computer, place your monitor at a height where you are looking straight ahead and you do not have to tilt your head forward or downward to look at the screen. Resting  When lying down and resting, avoid positions that are most painful for you.  If you have pain with activities such as sitting, bending, stooping, or squatting, lie in a position in which your body does not bend very much. For example, avoid curling up on your side with your arms and knees near your chest (fetal position).  If you have pain with activities such as standing for a long time or reaching with your arms, lie with your spine in a neutral position and bend your knees slightly. Try the following positions: ? Lying on your side with a pillow between your knees. ? Lying on your back with a pillow under your  knees. Lifting   When lifting objects, keep your feet at least shoulder width apart and tighten your abdominal muscles.  Bend your knees and hips and keep your spine neutral. It is important to lift using the strength of your legs, not your back. Do not lock your knees straight out.  Always ask for help to lift heavy or awkward objects. This information is not intended to replace advice given to you by your health care provider. Make sure you discuss any questions you have with your health care provider. Document Revised: 05/26/2018 Document Reviewed: 02/23/2018 Elsevier Patient Education  [place].

## 2019-07-06 LAB — CMP14+EGFR
ALT: 13 IU/L (ref 0–32)
AST: 14 IU/L (ref 0–40)
Albumin/Globulin Ratio: 1.3 (ref 1.2–2.2)
Albumin: 3.9 g/dL (ref 3.8–4.8)
Alkaline Phosphatase: 130 IU/L — ABNORMAL HIGH (ref 48–121)
BUN/Creatinine Ratio: 23 (ref 12–28)
BUN: 25 mg/dL (ref 8–27)
Bilirubin Total: <0.2 mg/dL (ref 0.0–1.2)
CO2: 26 mmol/L (ref 20–29)
Calcium: 9 mg/dL (ref 8.7–10.3)
Chloride: 103 mmol/L (ref 96–106)
Creatinine, Ser: 1.07 mg/dL — ABNORMAL HIGH (ref 0.57–1.00)
GFR calc Af Amer: 62 mL/min/{1.73_m2} (ref >59)
GFR calc non Af Amer: 53 mL/min/{1.73_m2} — ABNORMAL LOW (ref >59)
Globulin, Total: 3 g/dL (ref 1.5–4.5)
Glucose: 74 mg/dL (ref 65–99)
Potassium: 4.5 mmol/L (ref 3.5–5.2)
Sodium: 145 mmol/L — ABNORMAL HIGH (ref 134–144)
Total Protein: 6.9 g/dL (ref 6.0–8.5)

## 2019-07-06 LAB — LIPID PANEL
Chol/HDL Ratio: 3.1 ratio (ref 0.0–4.4)
Cholesterol, Total: 160 mg/dL (ref 100–199)
HDL: 52 mg/dL (ref >39)
LDL Chol Calc (NIH): 94 mg/dL (ref 0–99)
Triglycerides: 74 mg/dL (ref 0–149)
VLDL Cholesterol Cal: 14 mg/dL (ref 5–40)

## 2019-07-06 LAB — BAYER DCA HB A1C WAIVED: HB A1C (BAYER DCA - WAIVED): 6.3 % (ref <7.0)

## 2019-07-08 ENCOUNTER — Encounter: Payer: Self-pay | Admitting: Family Medicine

## 2019-07-09 LAB — COMPLIANCE DRUG ANALYSIS, UR

## 2019-08-03 ENCOUNTER — Other Ambulatory Visit: Payer: Self-pay | Admitting: Family Medicine

## 2019-08-03 DIAGNOSIS — M545 Low back pain, unspecified: Secondary | ICD-10-CM

## 2019-08-08 ENCOUNTER — Other Ambulatory Visit: Payer: Self-pay | Admitting: *Deleted

## 2019-08-08 DIAGNOSIS — M25519 Pain in unspecified shoulder: Secondary | ICD-10-CM

## 2019-08-08 DIAGNOSIS — G8929 Other chronic pain: Secondary | ICD-10-CM

## 2019-08-15 ENCOUNTER — Other Ambulatory Visit: Payer: Self-pay | Admitting: *Deleted

## 2019-08-17 ENCOUNTER — Encounter: Payer: Self-pay | Admitting: Family Medicine

## 2019-08-17 ENCOUNTER — Other Ambulatory Visit: Payer: Self-pay

## 2019-08-17 ENCOUNTER — Ambulatory Visit (INDEPENDENT_AMBULATORY_CARE_PROVIDER_SITE_OTHER): Payer: Medicare Other | Admitting: Family Medicine

## 2019-08-17 VITALS — BP 129/78 | HR 90 | Temp 97.4°F | Ht <= 58 in | Wt 183.0 lb

## 2019-08-17 DIAGNOSIS — F331 Major depressive disorder, recurrent, moderate: Secondary | ICD-10-CM

## 2019-08-17 DIAGNOSIS — F411 Generalized anxiety disorder: Secondary | ICD-10-CM | POA: Diagnosis not present

## 2019-08-17 DIAGNOSIS — G8929 Other chronic pain: Secondary | ICD-10-CM

## 2019-08-17 DIAGNOSIS — M5441 Lumbago with sciatica, right side: Secondary | ICD-10-CM

## 2019-08-17 DIAGNOSIS — M25552 Pain in left hip: Secondary | ICD-10-CM

## 2019-08-17 DIAGNOSIS — M25551 Pain in right hip: Secondary | ICD-10-CM | POA: Diagnosis not present

## 2019-08-17 DIAGNOSIS — M5442 Lumbago with sciatica, left side: Secondary | ICD-10-CM

## 2019-08-17 MED ORDER — TRAMADOL HCL 50 MG PO TABS: 50.0000 mg | ORAL_TABLET | Freq: Three times a day (TID) | ORAL | 0 refills | Status: AC | PRN

## 2019-08-17 MED ORDER — BUSPIRONE HCL 7.5 MG PO TABS: 7.5000 mg | ORAL_TABLET | Freq: Two times a day (BID) | ORAL | 1 refills | Status: DC

## 2019-08-17 NOTE — Progress Notes (Signed)
Assessment & Plan:  1. Generalized anxiety disorder - Well controlled on current regimen.  - busPIRone (BUSPAR) 7.5 MG tablet; Take 1 tablet (7.5 mg total) by mouth 2 (two) times daily.  Dispense: 180 tablet; Refill: 1  2. Major depressive disorder, recurrent, moderate (HCC) - Well controlled on current regimen.   3. Chronic bilateral low back pain with bilateral sciatica - Prescription provided for Tramadol to help with pain.  - Ambulatory referral to Orthopedic Surgery - traMADol (ULTRAM) 50 MG tablet; Take 1 tablet (50 mg total) by mouth every 8 (eight) hours as needed for up to 5 days.  Dispense: 15 tablet; Refill: 0  4. Chronic hip pain, bilateral - Ambulatory referral to Orthopedic Surgery - traMADol (ULTRAM) 50 MG tablet; Take 1 tablet (50 mg total) by mouth every 8 (eight) hours as needed for up to 5 days.  Dispense: 15 tablet; Refill: 0   Return in about 2 months (around 10/18/2019) for follow-up of chronic medication conditions.  Joyce Limes, MSN, APRN, FNP-C [facility]  Subjective:    [name], female    DOB: 1950-03-26, 69 y.o.   MRN: [id number]  Patient Care Team: Loman Brooklyn, FNP as PCP - General (Family Medicine) Ilean China, RN as Registered Nurse   Chief Complaint:  Chief Complaint  Patient presents with  . Back Pain    6 week follow up. Patient states that her back pain is not any better.  Marland Kitchen Anxiety    Patient states it is a little better.    HPI: Joyce Ross is a 69 y.o. female presenting on 08/17/2019 for Back Pain (6 week follow up. Patient states that her back pain is not any better.) and Anxiety (Patient states it is a little better.)  Anxiety/Depression: Patient is working on doing more around the house and getting out of the house. She feels good on current dosages of medications.  Depression screen [facility] 2/9 08/19/2019 07/05/2019 05/04/2019  Decreased Interest 1 2 0  Down, Depressed, Hopeless  1 1 0  PHQ - 2 Score 2 3 0  Altered sleeping 0 0 -  Tired, decreased energy 1 3 -  Change in appetite 0 3 -  Feeling bad or failure about yourself  1 3 -  Trouble concentrating 1 1 -  Moving slowly or fidgety/restless 0 0 -  Suicidal thoughts 0 0 -  PHQ-9 Score 5 13 -  Difficult doing work/chores Not difficult at all - -   GAD 7 : Generalized Anxiety Score 08/19/2019 07/05/2019  Nervous, Anxious, on Edge 1 3  Control/stop worrying 0 2  Worry too much - different things 1 2  Trouble relaxing 0 3  Restless 0 3  Easily annoyed or irritable 0 3  Afraid - awful might happen 0 3  Total GAD 7 Score 2 19  Anxiety Difficulty Not difficult at all -    Patient is also following up on chronic low back pain.  At her last visit she was advised to stop taking ibuprofen due to her kidney function and to tried Tylenol, Lidoderm patches, Biofreeze, and heating pad.  She was provided with exercises for her low back.  A lumbar spine x-ray was ordered that day in not completed for unknown reasons.  She reports today that her back is not any better.  She also feels she has a lot of pain in bilateral hips.   New complaints: None  Social history:  Relevant  past medical, surgical, family and social history reviewed and updated as indicated. Interim medical history since our last visit reviewed.  Allergies and medications reviewed and updated.  DATA REVIEWED: CHART IN EPIC  ROS: Negative unless specifically indicated above in HPI.    Current Outpatient Medications:  .  acetaminophen (TYLENOL) 325 MG tablet, Take 325 mg by mouth every 6 (six) hours as needed for moderate pain. , Disp: , Rfl:  .  albuterol (PROVENTIL HFA;VENTOLIN HFA) 108 (90 Base) MCG/ACT inhaler, Inhale 1-2 puffs into the lungs every 6 (six) hours as needed for wheezing or shortness of breath., Disp: , Rfl:  .  budesonide-formoterol (SYMBICORT) 80-4.5 MCG/ACT inhaler, Inhale 2 puffs into the lungs 2 (two) times daily. (Patient taking  differently: Inhale 2 puffs into the lungs 2 (two) times daily. ), Disp: 1 Inhaler, Rfl: 12 .  busPIRone (BUSPAR) 7.5 MG tablet, Take 1 tablet (7.5 mg total) by mouth 2 (two) times daily., Disp: 60 tablet, Rfl: 2 .  cetirizine (ZYRTEC) 10 MG tablet, Take 1 tablet (10 mg total) by mouth daily., Disp: 90 tablet, Rfl: 3 .  citalopram (CELEXA) 40 MG tablet, Take 1 tablet (40 mg total) by mouth daily., Disp: 90 tablet, Rfl: 3 .  cyclobenzaprine (FLEXERIL) 10 MG tablet, TAKE ONE TABLET BY MOUTH THREE TIMES A DAY AS NEEDED FOR MUSCLE SPASMS, Disp: 60 tablet, Rfl: 0 .  fluticasone (FLONASE) 50 MCG/ACT nasal spray, Place 2 sprays into both nostrils daily., Disp: 16 g, Rfl: 6 .  gabapentin (NEURONTIN) 300 MG capsule, TAKE ONE CAPSULE BY MOUTH three TIMES A DAY, Disp: 90 capsule, Rfl: 0 .  Melatonin 1 MG TABS, Take 1 mg by mouth at bedtime as needed (sleep). , Disp: , Rfl:  .  pantoprazole (PROTONIX) 20 MG tablet, Take 1 tablet (20 mg total) by mouth daily., Disp: 90 tablet, Rfl: 3 .  pravastatin (PRAVACHOL) 80 MG tablet, Take 1 tablet (80 mg total) by mouth at bedtime., Disp: 90 tablet, Rfl: 3   Allergies  Allergen Reactions  . Adhesive [Tape] Other (See Comments)    "leaves red places on skin"  . Metoprolol     nightmares  . Penicillins Other (See Comments)    Thrush Has patient had a PCN reaction causing immediate rash, facial/tongue/throat swelling, SOB or lightheadedness with hypotension: No Has patient had a PCN reaction causing severe rash involving mucus membranes or skin necrosis: No Has patient had a PCN reaction that required hospitalization: No Has patient had a PCN reaction occurring within the last 10 years: Yes If all of the above answers are "NO", then may proceed with Cephalosporin use.   . Radish [Raphanus Sativus] Hives  . Yellow Jacket Venom Itching and Rash   Past Medical History:  Diagnosis Date  . Coronary artery calcification seen on CAT scan 09/28/2017  . Dyspnea   .  Full dentures   . GERD (gastroesophageal reflux disease)   . Heart murmur    found a couple of years ago no problems  . History of kidney stones   . History of recurrent UTIs   . Hyperlipidemia   . Left-sided Bell's palsy 1990s   residual left facial drooping  . Mild intermittent asthma    followed by pcp  . Numbness and tingling in left arm    residual from cervical neck surgery going from shoulder down to hand, not completely  . OA (osteoarthritis)    hands, spine, hips, right leg  . Peripheral neuropathy  feet  . PONV (postoperative nausea and vomiting)   . Thoracic ascending aortic aneurysm ([place])    CT 09-28-2017--- 4.3 cm  . Thyroid nodule    ultrasound w/ bx 09/ 2019,  per pt was benign  . White coat syndrome without hypertension     Past Surgical History:  Procedure Laterality Date  . ANTERIOR CERVICAL DECOMP/DISCECTOMY FUSION  1980s  . CARPAL TUNNEL RELEASE Bilateral 2000  . CESAREAN SECTION  x2 last one 62  . CYSTOSCOPY WITH RETROGRADE PYELOGRAM, URETEROSCOPY AND STENT PLACEMENT Left 01/11/2018   Procedure: CYSTOSCOPY, URETEROSCOPY AND STENT PLACEMENT;  Surgeon: Lucas Mallow, MD;  Location: [facility];  Service: Urology;  Laterality: Left;  . CYSTOSCOPY/URETEROSCOPY/HOLMIUM LASER/STENT PLACEMENT Left 07/31/2018   Procedure: CYSTOSCOPY/URETEROSCOPY/HOLMIUM LASER/STENT PLACEMENT;  Surgeon: Lucas Mallow, MD;  Location: [facility];  Service: Urology;  Laterality: Left;  . EXTRACORPOREAL SHOCK WAVE LITHOTRIPSY Left 12/05/2017   Procedure: LEFT EXTRACORPOREAL SHOCK WAVE LITHOTRIPSY (ESWL);  Surgeon: Franchot Gallo, MD;  Location: [facility];  Service: Urology;  Laterality: Left;  . EXTRACORPOREAL SHOCK WAVE LITHOTRIPSY  1990s  . HOLMIUM LASER APPLICATION Left 24/26/8341   Procedure: HOLMIUM LASER APPLICATION;  Surgeon: Lucas Mallow, MD;  Location: [facility];  Service: Urology;  Laterality: Left;  . MOUTH SURGERY     all teeth pulled    Social History    Socioeconomic History  . Marital status: Widowed    Spouse name: Not on file  . Number of children: 2  . Years of education: 12th Grade  . Highest education level: High school graduate  Occupational History  . Occupation: retired  Tobacco Use  . Smoking status: Former Smoker    Years: 44.00    Types: Cigarettes    Quit date: 10/09/2012    Years since quitting: 6.8  . Smokeless tobacco: Never Used  . Tobacco comment: since age 38  Vaping Use  . Vaping Use: Never used  Substance and Sexual Activity  . Alcohol use: No  . Drug use: No    Comment: per pt last used "pot" 1970s  . Sexual activity: Not Currently    Birth control/protection: Post-menopausal  Other Topics Concern  . Not on file  Social History Narrative  . Not on file   Social Determinants of Health   Financial Resource Strain: Low Risk   . Difficulty of Paying Living Expenses: Not very hard  Food Insecurity: No Food Insecurity  . Worried About Charity fundraiser in the Last Year: Never true  . Ran Out of Food in the Last Year: Never true  Transportation Needs: No Transportation Needs  . Lack of Transportation (Medical): No  . Lack of Transportation (Non-Medical): No  Physical Activity: Insufficiently Active  . Days of Exercise per Week: 7 days  . Minutes of Exercise per Session: 10 min  Stress: No Stress Concern Present  . Feeling of Stress : Not at all  Social Connections: Socially Isolated  . Frequency of Communication with Friends and Family: More than three times a week  . Frequency of Social Gatherings with Friends and Family: More than three times a week  . Attends Religious Services: Never  . Active Member of Clubs or Organizations: No  . Attends Archivist Meetings: Never  . Marital Status: Widowed  Intimate Partner Violence: Not At Risk  . Fear of Current or Ex-Partner: No  . Emotionally Abused: No  . Physically Abused: No  . Sexually Abused: No  Objective:    BP 129/78    Pulse 90   Temp (!) 97.4 F (36.3 C) (Temporal)   Ht 4\' 10"  (1.473 m)   Wt 183 lb (83 kg)   SpO2 95%   BMI 38.25 kg/m   Wt Readings from Last 3 Encounters:  08/17/19 183 lb (83 kg)  07/05/19 184 lb 6.4 oz (83.6 kg)  05/04/19 178 lb (80.7 kg)    Physical Exam Vitals reviewed.  Constitutional:      General: She is not in acute distress.    Appearance: Normal appearance. She is obese. She is not ill-appearing, toxic-appearing or diaphoretic.  HENT:     Head: Normocephalic and atraumatic.  Eyes:     General: No scleral icterus.       Right eye: No discharge.        Left eye: No discharge.     Conjunctiva/sclera: Conjunctivae normal.  Cardiovascular:     Rate and Rhythm: Normal rate and regular rhythm.     Heart sounds: Normal heart sounds. No murmur heard.  No friction rub. No gallop.   Pulmonary:     Effort: Pulmonary effort is normal. No respiratory distress.     Breath sounds: Normal breath sounds. No stridor. No wheezing, rhonchi or rales.  Musculoskeletal:        General: Normal range of motion.     Cervical back: Normal range of motion.     Lumbar back: Tenderness present.  Skin:    General: Skin is warm and dry.     Capillary Refill: Capillary refill takes less than 2 seconds.  Neurological:     General: No focal deficit present.     Mental Status: She is alert and oriented to person, place, and time. Mental status is at baseline.  Psychiatric:        Mood and Affect: Mood normal.        Behavior: Behavior normal.        Thought Content: Thought content normal.        Judgment: Judgment normal.     No results found for: TSH Lab Results  Component Value Date   WBC 9.2 07/26/2018   HGB 12.8 07/26/2018   HCT 40.4 07/26/2018   MCV 99.0 07/26/2018   PLT 296 07/26/2018   Lab Results  Component Value Date   NA 145 (H) 07/05/2019   K 4.5 07/05/2019   CO2 26 07/05/2019   GLUCOSE 74 07/05/2019   BUN 25 07/05/2019   CREATININE 1.07 (H) 07/05/2019    BILITOT <0.2 07/05/2019   ALKPHOS 130 (H) 07/05/2019   AST 14 07/05/2019   ALT 13 07/05/2019   PROT 6.9 07/05/2019   ALBUMIN 3.9 07/05/2019   CALCIUM 9.0 07/05/2019   ANIONGAP 10 07/26/2018   Lab Results  Component Value Date   CHOL 160 07/05/2019   Lab Results  Component Value Date   HDL 52 07/05/2019   Lab Results  Component Value Date   LDLCALC 94 07/05/2019   Lab Results  Component Value Date   TRIG 74 07/05/2019   Lab Results  Component Value Date   CHOLHDL 3.1 07/05/2019   Lab Results  Component Value Date   HGBA1C 6.3 07/05/2019

## 2019-08-19 ENCOUNTER — Encounter: Payer: Self-pay | Admitting: Family Medicine

## 2019-08-19 DIAGNOSIS — F411 Generalized anxiety disorder: Secondary | ICD-10-CM | POA: Insufficient documentation

## 2019-08-21 ENCOUNTER — Telehealth: Payer: Self-pay | Admitting: Family Medicine

## 2019-08-21 DIAGNOSIS — F411 Generalized anxiety disorder: Secondary | ICD-10-CM

## 2019-08-21 MED ORDER — BUSPIRONE HCL 15 MG PO TABS: 15.0000 mg | ORAL_TABLET | Freq: Two times a day (BID) | ORAL | 2 refills | Status: DC

## 2019-08-21 NOTE — Telephone Encounter (Signed)
Pt aware of provider feedback and voiced understanding. 

## 2019-08-21 NOTE — Telephone Encounter (Signed)
I have increased her buspirone dosage to 15 mg BID but she needs to start by increasing from 7.5 BID to 15 mg in the AM and 7.5 mg in the PM x1 week. Then she can go up to the 15 mg BID.

## 2019-08-21 NOTE — Telephone Encounter (Signed)
Patient states that she thought she was doing good but this past weekend she has cried the whole weekend.  Patient is not sure what is going on.

## 2019-08-21 NOTE — Telephone Encounter (Signed)
Has something happened? When I saw her just end of last week she felt she was doing good on current dosages.

## 2019-09-03 ENCOUNTER — Other Ambulatory Visit: Payer: Self-pay | Admitting: Family Medicine

## 2019-09-03 DIAGNOSIS — G629 Polyneuropathy, unspecified: Secondary | ICD-10-CM

## 2019-09-03 NOTE — Addendum Note (Signed)
Addended by: Antonietta Barcelona D on: 09/03/2019 11:15 AM   Modules accepted: Orders

## 2019-09-04 MED ORDER — GABAPENTIN 300 MG PO CAPS: 300.0000 mg | ORAL_CAPSULE | Freq: Three times a day (TID) | ORAL | 1 refills | Status: DC

## 2019-09-12 ENCOUNTER — Ambulatory Visit: Payer: Medicare Other | Admitting: Family Medicine

## 2019-09-13 ENCOUNTER — Encounter: Payer: Self-pay | Admitting: Family Medicine

## 2019-09-26 ENCOUNTER — Other Ambulatory Visit: Payer: Self-pay

## 2019-09-26 DIAGNOSIS — E041 Nontoxic single thyroid nodule: Secondary | ICD-10-CM

## 2019-09-26 DIAGNOSIS — M1990 Unspecified osteoarthritis, unspecified site: Secondary | ICD-10-CM

## 2019-09-26 DIAGNOSIS — Z780 Asymptomatic menopausal state: Secondary | ICD-10-CM

## 2019-10-03 ENCOUNTER — Other Ambulatory Visit: Payer: Self-pay | Admitting: Family Medicine

## 2019-10-03 DIAGNOSIS — M25551 Pain in right hip: Secondary | ICD-10-CM

## 2019-10-03 DIAGNOSIS — M5442 Lumbago with sciatica, left side: Secondary | ICD-10-CM

## 2019-10-03 DIAGNOSIS — M545 Low back pain, unspecified: Secondary | ICD-10-CM

## 2019-10-03 DIAGNOSIS — G8929 Other chronic pain: Secondary | ICD-10-CM

## 2019-10-04 NOTE — Telephone Encounter (Signed)
Last OV 08/17/19. Last RF 08/03/19 . Next OV 10/18/19

## 2019-10-16 ENCOUNTER — Other Ambulatory Visit: Payer: Self-pay | Admitting: *Deleted

## 2019-10-16 DIAGNOSIS — E785 Hyperlipidemia, unspecified: Secondary | ICD-10-CM

## 2019-10-16 MED ORDER — PRAVASTATIN SODIUM 80 MG PO TABS: 80.0000 mg | ORAL_TABLET | Freq: Every day | ORAL | 0 refills | Status: DC

## 2019-10-18 ENCOUNTER — Ambulatory Visit (INDEPENDENT_AMBULATORY_CARE_PROVIDER_SITE_OTHER): Payer: Medicare Other | Admitting: Family Medicine

## 2019-10-18 DIAGNOSIS — G629 Polyneuropathy, unspecified: Secondary | ICD-10-CM | POA: Diagnosis not present

## 2019-10-18 DIAGNOSIS — K219 Gastro-esophageal reflux disease without esophagitis: Secondary | ICD-10-CM

## 2019-10-18 DIAGNOSIS — F411 Generalized anxiety disorder: Secondary | ICD-10-CM | POA: Diagnosis not present

## 2019-10-18 DIAGNOSIS — F331 Major depressive disorder, recurrent, moderate: Secondary | ICD-10-CM

## 2019-10-18 DIAGNOSIS — E785 Hyperlipidemia, unspecified: Secondary | ICD-10-CM

## 2019-10-18 DIAGNOSIS — R7303 Prediabetes: Secondary | ICD-10-CM

## 2019-10-18 NOTE — Progress Notes (Signed)
Virtual Visit via Telephone Note  I connected with Joyce Ross on 10/18/19 at 11:10 AM by telephone and verified that I am speaking with the correct person using two identifiers. Joyce Ross is currently located at home and nobody is currently with her during this visit. The provider, Loman Brooklyn, FNP is located in their office at time of visit.  I discussed the limitations, risks, security and privacy concerns of performing an evaluation and management service by telephone and the availability of in person appointments. I also discussed with the patient that there may be a patient responsible charge related to this service. The patient expressed understanding and agreed to proceed.  Subjective: PCP: Loman Brooklyn, FNP  Chief Complaint  Patient presents with  . Medical Management of Chronic Issues   Anxiety/Depression: patient reports some days she feels she okay and others she doesn't. She is only taking Buspirone 15 mg QHS.    ROS: Per HPI  Current Outpatient Medications:  .  acetaminophen (TYLENOL) 325 MG tablet, Take 325 mg by mouth every 6 (six) hours as needed for moderate pain. , Disp: , Rfl:  .  albuterol (PROVENTIL HFA;VENTOLIN HFA) 108 (90 Base) MCG/ACT inhaler, Inhale 1-2 puffs into the lungs every 6 (six) hours as needed for wheezing or shortness of breath., Disp: , Rfl:  .  budesonide-formoterol (SYMBICORT) 80-4.5 MCG/ACT inhaler, Inhale 2 puffs into the lungs 2 (two) times daily. (Patient taking differently: Inhale 2 puffs into the lungs 2 (two) times daily. ), Disp: 1 Inhaler, Rfl: 12 .  busPIRone (BUSPAR) 15 MG tablet, Take 1 tablet (15 mg total) by mouth 2 (two) times daily., Disp: 60 tablet, Rfl: 2 .  cetirizine (ZYRTEC) 10 MG tablet, Take 1 tablet (10 mg total) by mouth daily., Disp: 90 tablet, Rfl: 3 .  citalopram (CELEXA) 40 MG tablet, Take 1 tablet (40 mg total) by mouth daily., Disp: 90 tablet, Rfl: 3 .  cyclobenzaprine (FLEXERIL) 10 MG tablet,  TAKE ONE TABLET BY MOUTH THREE TIMES A DAY AS NEEDED FOR MUSCLE SPASMS, Disp: 60 tablet, Rfl: 0 .  fluticasone (FLONASE) 50 MCG/ACT nasal spray, Place 2 sprays into both nostrils daily., Disp: 16 g, Rfl: 6 .  gabapentin (NEURONTIN) 300 MG capsule, Take 1 capsule (300 mg total) by mouth 3 (three) times daily., Disp: 90 capsule, Rfl: 1 .  Melatonin 1 MG TABS, Take 1 mg by mouth at bedtime as needed (sleep). , Disp: , Rfl:  .  pantoprazole (PROTONIX) 20 MG tablet, Take 1 tablet (20 mg total) by mouth daily., Disp: 90 tablet, Rfl: 3 .  pravastatin (PRAVACHOL) 80 MG tablet, Take 1 tablet (80 mg total) by mouth at bedtime., Disp: 90 tablet, Rfl: 0  Allergies  Allergen Reactions  . Adhesive [Tape] Other (See Comments)    "leaves red places on skin"  . Metoprolol     nightmares  . Penicillins Other (See Comments)    Thrush Has patient had a PCN reaction causing immediate rash, facial/tongue/throat swelling, SOB or lightheadedness with hypotension: No Has patient had a PCN reaction causing severe rash involving mucus membranes or skin necrosis: No Has patient had a PCN reaction that required hospitalization: No Has patient had a PCN reaction occurring within the last 10 years: Yes If all of the above answers are "NO", then may proceed with Cephalosporin use.   . Radish [Raphanus Sativus] Hives  . Yellow Jacket Venom Itching and Rash   Past Medical History:  Diagnosis Date  .  Coronary artery calcification seen on CAT scan 09/28/2017  . Dyspnea   . Full dentures   . GERD (gastroesophageal reflux disease)   . Heart murmur    found a couple of years ago no problems  . History of kidney stones   . History of recurrent UTIs   . Hyperlipidemia   . Left-sided Bell's palsy 1990s   residual left facial drooping  . Mild intermittent asthma    followed by pcp  . Numbness and tingling in left arm    residual from cervical neck surgery going from shoulder down to hand, not completely  . OA  (osteoarthritis)    hands, spine, hips, right leg  . Peripheral neuropathy    feet  . PONV (postoperative nausea and vomiting)   . Thoracic ascending aortic aneurysm ([place])    CT 09-28-2017--- 4.3 cm  . Thyroid nodule    ultrasound w/ bx 09/ 2019,  per pt was benign  . White coat syndrome without hypertension     Observations/Objective: A&O  No respiratory distress or wheezing audible over the phone Mood, judgement, and thought processes all WNL  Assessment and Plan: 1-2. Major depressive disorder, recurrent, moderate (HCC)/Generalized anxiety disorder - Somewhat controlled. Encouraged to increase buspirone from 15 mg QHS to BID. Advised to take 7.5 mg in the AM x1 week and then she could increase to the whole tablet. - CMP14+EGFR; Future  3. Gastroesophageal reflux disease, unspecified whether esophagitis present - Well controlled on current regimen.  - CMP14+EGFR; Future - CBC with Differential/Platelet; Future  4. Neuropathy - Well controlled on current regimen.  - CMP14+EGFR; Future - CBC with Differential/Platelet; Future  5. Pre-diabetes - Bayer DCA Hb A1c Waived; Future  6. Hyperlipidemia, unspecified hyperlipidemia type - CMP14+EGFR; Future - CBC with Differential/Platelet; Future   Follow Up Instructions: Return as directed after labs result.  I discussed the assessment and treatment plan with the patient. The patient was provided an opportunity to ask questions and all were answered. The patient agreed with the plan and demonstrated an understanding of the instructions.   The patient was advised to call back or seek an in-person evaluation if the symptoms worsen or if the condition fails to improve as anticipated.  The above assessment and management plan was discussed with the patient. The patient verbalized understanding of and has agreed to the management plan. Patient is aware to call the clinic if symptoms persist or worsen. Patient is aware when to return  to the clinic for a follow-up visit. Patient educated on when it is appropriate to go to the emergency department.   Time call ended: 11:23 AM  I provided 15 minutes of non-face-to-face time during this encounter.  Hendricks Limes, MSN, APRN, FNP-C [facility] 10/18/19

## 2019-10-21 ENCOUNTER — Encounter: Payer: Self-pay | Admitting: Family Medicine

## 2019-10-29 ENCOUNTER — Ambulatory Visit (INDEPENDENT_AMBULATORY_CARE_PROVIDER_SITE_OTHER): Payer: Medicare Other | Admitting: *Deleted

## 2019-10-29 DIAGNOSIS — Z0000 Encounter for general adult medical examination without abnormal findings: Secondary | ICD-10-CM | POA: Diagnosis not present

## 2019-10-29 NOTE — Progress Notes (Signed)
MEDICARE ANNUAL WELLNESS VISIT  10/29/2019  Telephone Visit Disclaimer This Medicare AWV was conducted by telephone due to national recommendations for restrictions regarding the COVID-19 Pandemic (e.g. social distancing).  I verified, using two identifiers, that I am speaking with Joyce Ross or their authorized healthcare agent. I discussed the limitations, risks, security, and privacy concerns of performing an evaluation and management service by telephone and the potential availability of an in-person appointment in the future. The patient expressed understanding and agreed to proceed.  Location of Patient: Home Location of Provider (nurse):  In office  Subjective:    PURVI RUEHL is a 69 y.o. female patient of Loman Brooklyn, FNP who had a Medicare Annual Wellness Visit today via telephone. Elize is Retired and lives with their son. she has 2 children. she reports that she is socially active and does interact with friends/family regularly. she is minimally physically active and enjoys reading.  Patient Care Team: Loman Brooklyn, FNP as PCP - General (Family Medicine) Ilean China, RN as Registered Nurse  Advanced Directives 10/29/2019 10/27/2018 07/31/2018 07/26/2018 01/05/2018 12/05/2017 10/25/2017  Does Patient Have a Medical Advance Directive? No No No No No No No  Would patient like information on creating a medical advance directive? No - Patient declined No - Patient declined No - Patient declined Yes (MAU/Ambulatory/Procedural Areas - Information given) - No - Patient declined Yes (MAU/Ambulatory/Procedural Areas - Information given)    Hospital Utilization Over the Past 12 Months: # of hospitalizations or ER visits: 0 # of surgeries: 0  Review of Systems    Patient reports that her overall health is worse compared to last year.  History obtained from chart review and the patient  Patient Reported Readings (BP, Pulse, CBG, Weight, etc) none  Pain  Assessment Pain : No/denies pain     Current Medications & Allergies (verified) Allergies as of 10/29/2019      Reactions   Adhesive [tape] Other (See Comments)   "leaves red places on skin"   Metoprolol    nightmares   Penicillins Other (See Comments)   Thrush Has patient had a PCN reaction causing immediate rash, facial/tongue/throat swelling, SOB or lightheadedness with hypotension: No Has patient had a PCN reaction causing severe rash involving mucus membranes or skin necrosis: No Has patient had a PCN reaction that required hospitalization: No Has patient had a PCN reaction occurring within the last 10 years: Yes If all of the above answers are "NO", then may proceed with Cephalosporin use.   Radish [raphanus Sativus] Hives   Yellow Jacket Venom Itching, Rash      Medication List       Accurate as of October 29, 2019  9:17 AM. If you have any questions, ask your nurse or doctor.        STOP taking these medications   fluticasone 50 MCG/ACT nasal spray Commonly known as: FLONASE     TAKE these medications   acetaminophen 325 MG tablet Commonly known as: TYLENOL Take 325 mg by mouth every 6 (six) hours as needed for moderate pain.   albuterol 108 (90 Base) MCG/ACT inhaler Commonly known as: VENTOLIN HFA Inhale 1-2 puffs into the lungs every 6 (six) hours as needed for wheezing or shortness of breath.   budesonide-formoterol 80-4.5 MCG/ACT inhaler Commonly known as: Symbicort Inhale 2 puffs into the lungs 2 (two) times daily.   busPIRone 15 MG tablet Commonly known as: BUSPAR Take 1 tablet (15 mg total)  by mouth 2 (two) times daily.   cetirizine 10 MG tablet Commonly known as: ZYRTEC Take 1 tablet (10 mg total) by mouth daily.   citalopram 40 MG tablet Commonly known as: CELEXA Take 1 tablet (40 mg total) by mouth daily.   cyclobenzaprine 10 MG tablet Commonly known as: FLEXERIL TAKE ONE TABLET BY MOUTH THREE TIMES A DAY AS NEEDED FOR MUSCLE SPASMS     gabapentin 300 MG capsule Commonly known as: NEURONTIN Take 1 capsule (300 mg total) by mouth 3 (three) times daily.   melatonin 1 MG Tabs tablet Take 1 mg by mouth at bedtime as needed (sleep).   pantoprazole 20 MG tablet Commonly known as: Protonix Take 1 tablet (20 mg total) by mouth daily.   pravastatin 80 MG tablet Commonly known as: PRAVACHOL Take 1 tablet (80 mg total) by mouth at bedtime.       History (reviewed): Past Medical History:  Diagnosis Date  . Coronary artery calcification seen on CAT scan 09/28/2017  . Full dentures   . GERD (gastroesophageal reflux disease)   . Heart murmur    found a couple of years ago no problems  . History of kidney stones   . History of recurrent UTIs   . Hyperlipidemia   . Left-sided Bell's palsy 1990s   residual left facial drooping  . Mild intermittent asthma    followed by pcp  . Numbness and tingling in left arm    residual from cervical neck surgery going from shoulder down to hand, not completely  . OA (osteoarthritis)    hands, spine, hips, right leg  . Peripheral neuropathy    feet  . PONV (postoperative nausea and vomiting)   . Thoracic ascending aortic aneurysm ([place])    CT 09-28-2017--- 4.3 cm  . Thyroid nodule    ultrasound w/ bx 09/ 2019,  per pt was benign  . White coat syndrome without hypertension    Past Surgical History:  Procedure Laterality Date  . ANTERIOR CERVICAL DECOMP/DISCECTOMY FUSION  1980s  . CARPAL TUNNEL RELEASE Bilateral 2000  . CESAREAN SECTION  x2 last one 27  . CYSTOSCOPY WITH RETROGRADE PYELOGRAM, URETEROSCOPY AND STENT PLACEMENT Left 01/11/2018   Procedure: CYSTOSCOPY, URETEROSCOPY AND STENT PLACEMENT;  Surgeon: Lucas Mallow, MD;  Location: [facility];  Service: Urology;  Laterality: Left;  . CYSTOSCOPY/URETEROSCOPY/HOLMIUM LASER/STENT PLACEMENT Left 07/31/2018   Procedure: CYSTOSCOPY/URETEROSCOPY/HOLMIUM LASER/STENT PLACEMENT;  Surgeon: Lucas Mallow, MD;  Location: [facility];   Service: Urology;  Laterality: Left;  . EXTRACORPOREAL SHOCK WAVE LITHOTRIPSY Left 12/05/2017   Procedure: LEFT EXTRACORPOREAL SHOCK WAVE LITHOTRIPSY (ESWL);  Surgeon: Franchot Gallo, MD;  Location: [facility];  Service: Urology;  Laterality: Left;  . EXTRACORPOREAL SHOCK WAVE LITHOTRIPSY  1990s  . HOLMIUM LASER APPLICATION Left 57/02/7791   Procedure: HOLMIUM LASER APPLICATION;  Surgeon: Lucas Mallow, MD;  Location: [facility];  Service: Urology;  Laterality: Left;  . MOUTH SURGERY     all teeth pulled   Family History  Problem Relation Age of Onset  . Heart disease Mother   . Heart disease Father        heart attack  . Heart disease Sister   . Diabetes Brother   . Diabetes Brother        sepsis  . Heart disease Brother    Social History   Socioeconomic History  . Marital status: Widowed    Spouse name: Not on file  . Number of children: 2  .  Years of education: 12th Grade  . Highest education level: High school graduate  Occupational History  . Occupation: retired  Tobacco Use  . Smoking status: Former Smoker    Years: 44.00    Types: Cigarettes    Quit date: 10/09/2012    Years since quitting: 7.0  . Smokeless tobacco: Never Used  . Tobacco comment: since age 2  Vaping Use  . Vaping Use: Never used  Substance and Sexual Activity  . Alcohol use: No  . Drug use: No    Comment: per pt last used "pot" 1970s  . Sexual activity: Not Currently    Birth control/protection: Post-menopausal  Other Topics Concern  . Not on file  Social History Narrative  . Not on file   Social Determinants of Health   Financial Resource Strain: Low Risk   . Difficulty of Paying Living Expenses: Not very hard  Food Insecurity: No Food Insecurity  . Worried About Charity fundraiser in the Last Year: Never true  . Ran Out of Food in the Last Year: Never true  Transportation Needs: No Transportation Needs  . Lack of Transportation (Medical): No  . Lack of Transportation  (Non-Medical): No  Physical Activity: Insufficiently Active  . Days of Exercise per Week: 3 days  . Minutes of Exercise per Session: 20 min  Stress: No Stress Concern Present  . Feeling of Stress : Only a little  Social Connections: Moderately Isolated  . Frequency of Communication with Friends and Family: More than three times a week  . Frequency of Social Gatherings with Friends and Family: More than three times a week  . Attends Religious Services: More than 4 times per year  . Active Member of Clubs or Organizations: No  . Attends Archivist Meetings: Never  . Marital Status: Widowed    Activities of Daily Living In your present state of health, do you have any difficulty performing the following activities: 10/29/2019 02/20/2019  Hearing? N Y  Comment - reports having a quick hearing eval at [facility] and she was told that she might benefit from hearing aids  Vision? Y N  Comment wears glasses, had exam 05/2019 -  Difficulty concentrating or making decisions? N N  Walking or climbing stairs? Y Y  Comment bilateral knee pain lower back pain that worsens when walking  Dressing or bathing? N N  Doing errands, shopping? N N  Preparing Food and eating ? N -  Using the Toilet? N -  In the past six months, have you accidently leaked urine? N -  Do you have problems with loss of bowel control? N -  Managing your Medications? N -  Managing your Finances? N -  Housekeeping or managing your Housekeeping? N -  Some recent data might be hidden    Patient Education/ Literacy How often do you need to have someone help you when you read instructions, pamphlets, or other written materials from your doctor or pharmacy?: 1 - Never What is the last grade level you completed in school?: 12  Exercise Current Exercise Habits: The patient does not participate in regular exercise at present, Exercise limited by: orthopedic condition(s)  Diet Patient reports consuming 2 meals a day and 1  snack(s) a day Patient reports that her primary diet is: Regular Patient reports that she does have regular access to food.   Depression Screen PHQ 2/9 Scores 10/29/2019 08/19/2019 07/05/2019 05/04/2019 10/27/2018 05/09/2018 12/14/2017  PHQ - 2 Score 1 2 3  0 0 2 2  PHQ- 9 Score - 5 13 - - 6 7     Fall Risk Fall Risk  10/29/2019 08/17/2019 07/05/2019 05/04/2019 02/20/2019  Falls in the past year? 1 0 0 1 1  Number falls in past yr: 0 - - 0 0  Injury with Fall? 0 - - 1 1  Comment - - - shoulder pain -  Risk Factor Category  - - - - -  Risk for fall due to : History of fall(s);Orthopedic patient - - - History of fall(s)  Follow up - - - - -  Comment - - - - -     Objective:  Joyce Ross seemed alert and oriented and she participated appropriately during our telephone visit.  Blood Pressure Weight BMI  BP Readings from Last 3 Encounters:  08/17/19 129/78  07/05/19 (!) 121/51  05/04/19 134/80   Wt Readings from Last 3 Encounters:  08/17/19 183 lb (83 kg)  07/05/19 184 lb 6.4 oz (83.6 kg)  05/04/19 178 lb (80.7 kg)   BMI Readings from Last 1 Encounters:  08/17/19 38.25 kg/m    *Unable to obtain current vital signs, weight, and BMI due to telephone visit type  Hearing/Vision  . Ailen did not seem to have difficulty with hearing/understanding during the telephone conversation . Reports that she has had a formal eye exam by an eye care professional within the past year . Reports that she has not had a formal hearing evaluation within the past year *Unable to fully assess hearing and vision during telephone visit type  Cognitive Function: 6CIT Screen 10/29/2019 10/27/2018  What Year? 0 points 0 points  What month? 0 points 0 points  What time? 0 points 0 points  Count back from 20 0 points 0 points  Months in reverse 0 points 0 points  Repeat phrase 0 points 6 points  Total Score 0 6   (Normal:0-7, Significant for Dysfunction: >8)  Normal Cognitive Function Screening:  Yes   Immunization & Health Maintenance Record Immunization History  Administered Date(s) Administered  . Moderna SARS-COVID-2 Vaccination 07/18/2019  . PFIZER SARS-COV-2 Vaccination 05/31/2019, 06/22/2019  . Pneumococcal Conjugate-13 10/25/2017  . Tdap 02/05/2013    Health Maintenance  Topic Date Due  . INFLUENZA VACCINE  Never done  . COLONOSCOPY  08/16/2020 (Originally 11/26/2000)  . PNA vac Low Risk Adult (2 of 2 - PPSV23) 08/16/2020 (Originally 10/26/2018)  . MAMMOGRAM  01/14/2020  . TETANUS/TDAP  02/06/2023  . DEXA SCAN  Completed  . COVID-19 Vaccine  Completed  . Hepatitis C Screening  Completed       Assessment  This is a routine wellness examination for Joyce Ross.  Health Maintenance: Due or Overdue Health Maintenance Due  Topic Date Due  . INFLUENZA VACCINE  Never done    Joyce Ross does not need a referral for Community Assistance: Care Management:   no Social Work:    no Prescription Assistance:  no Nutrition/Diabetes Education:  no   Plan:  Personalized Goals Goals Addressed            This Visit's Progress   . Chronic Disease Management Needs   Not on track    Current Barriers:  . Chronic Disease Management support, education, and care coordination needs related to hypertension, asthma, GERD, osteoarthritis, neuropathy, adjustment disorder with anxiety and depression  Clinical Goal(s) related to hypertension, asthma, GERD, osteoarthritis, neuropathy, adjustment disorder with anxiety and depression:  Over the next 90  days, patient will:  . Work with the care management team to address educational, disease management, and care coordination needs   . Call provider office for new or worsened signs and symptoms  . Call care management team with questions or concerns . Verbalize basic understanding of patient centered plan of care established today  Interventions related to hypertension, asthma, GERD, osteoarthritis, neuropathy,  adjustment disorder with anxiety and depression:  . Evaluation of current treatment plans and patient's adherence to plan as established by provider . Assessed patient understanding of disease states . Assessed patient's education and care coordination needs . Provided disease specific education to patient  . Collaborated with appropriate clinical care team members regarding patient needs  Patient Self Care Activities related to hypertension, asthma, GERD, osteoarthritis, neuropathy, adjustment disorder with anxiety and depression:  . Patient is unable to independently self-manage chronic health conditions  Initial goal documentation     . Increase physical activity      . LIFESTYLE - DECREASE FALLS RISK        Personalized Health Maintenance & Screening Recommendations  Pneumococcal vaccine  Influenza vaccine  Lung Cancer Screening Recommended: no (Low Dose CT Chest recommended if Age 57-80 years, 30 pack-year currently smoking OR have quit w/in past 15 years) Hepatitis C Screening recommended: no HIV Screening recommended: no  Advanced Directives: Written information was not prepared per patient's request.  Referrals & Orders No orders of the defined types were placed in this encounter.   Follow-up Plan . Follow-up with Loman Brooklyn, FNP as planned . Pt has mammogram due in Nov. 2021 . Pt has previously declined pneumonia, informed pt about the need and this would be her last one, she will think about it. . Covid vaccines complete. . Pt is independent with all ADL's, uses a cane for short distances and a walker for longer distances. . Pt declined advanced directive information. . Pt will try to increase physical activities as her knees will allow. . Voices no healthcare concerns . AVS printed and mailed to pt.     I have personally reviewed and noted the following in the patient's chart:   . Medical and social history . Use of alcohol, tobacco or illicit drugs   . Current medications and supplements . Functional ability and status . Nutritional status . Physical activity . Advanced directives . List of other physicians . Hospitalizations, surgeries, and ER visits in previous 12 months . Vitals . Screenings to include cognitive, depression, and falls . Referrals and appointments  In addition, I have reviewed and discussed with Joyce Ross certain preventive protocols, quality metrics, and best practice recommendations. A written personalized care plan for preventive services as well as general preventive health recommendations is available and can be mailed to the patient at her request.      Rana Snare, LPN  8/34/1962

## 2019-11-05 ENCOUNTER — Other Ambulatory Visit: Payer: Self-pay | Admitting: *Deleted

## 2019-11-05 MED ORDER — PANTOPRAZOLE SODIUM 20 MG PO TBEC: 20.0000 mg | DELAYED_RELEASE_TABLET | Freq: Every day | ORAL | 1 refills | Status: DC

## 2019-11-14 ENCOUNTER — Other Ambulatory Visit: Payer: Self-pay

## 2019-11-14 ENCOUNTER — Ambulatory Visit (INDEPENDENT_AMBULATORY_CARE_PROVIDER_SITE_OTHER): Payer: Medicare Other

## 2019-11-14 DIAGNOSIS — M1990 Unspecified osteoarthritis, unspecified site: Secondary | ICD-10-CM

## 2019-11-14 DIAGNOSIS — Z780 Asymptomatic menopausal state: Secondary | ICD-10-CM

## 2019-11-14 DIAGNOSIS — E041 Nontoxic single thyroid nodule: Secondary | ICD-10-CM

## 2019-11-19 ENCOUNTER — Encounter: Payer: Self-pay | Admitting: Family Medicine

## 2019-11-19 DIAGNOSIS — M8580 Other specified disorders of bone density and structure, unspecified site: Secondary | ICD-10-CM | POA: Insufficient documentation

## 2019-12-10 ENCOUNTER — Other Ambulatory Visit: Payer: Self-pay | Admitting: Family Medicine

## 2019-12-10 DIAGNOSIS — M545 Low back pain, unspecified: Secondary | ICD-10-CM

## 2019-12-12 ENCOUNTER — Other Ambulatory Visit: Payer: Self-pay | Admitting: *Deleted

## 2019-12-12 DIAGNOSIS — J309 Allergic rhinitis, unspecified: Secondary | ICD-10-CM

## 2019-12-12 MED ORDER — CETIRIZINE HCL 10 MG PO TABS: 10.0000 mg | ORAL_TABLET | Freq: Every day | ORAL | 1 refills | Status: DC

## 2019-12-31 ENCOUNTER — Other Ambulatory Visit: Payer: Self-pay | Admitting: Family Medicine

## 2019-12-31 DIAGNOSIS — E785 Hyperlipidemia, unspecified: Secondary | ICD-10-CM

## 2020-01-28 IMAGING — CT CT CHEST W/O CM
2 of 3 series · 15 of 36 positions shown, 18 images · non-contrast
Comparison: Radiographs September 21, 2007.

CLINICAL DATA: Localized enlarged lymph nodes.

EXAM:
CT CHEST WITHOUT CONTRAST
TECHNIQUE: Multidetector CT imaging of the chest was performed following the
standard protocol without IV contrast.

[Series 2: thorax · axial · 0.67mm/px · z∈[-399,-143]mm · 12 of 152 slices shown, 15 images]
[im 12/152  mediastinal]
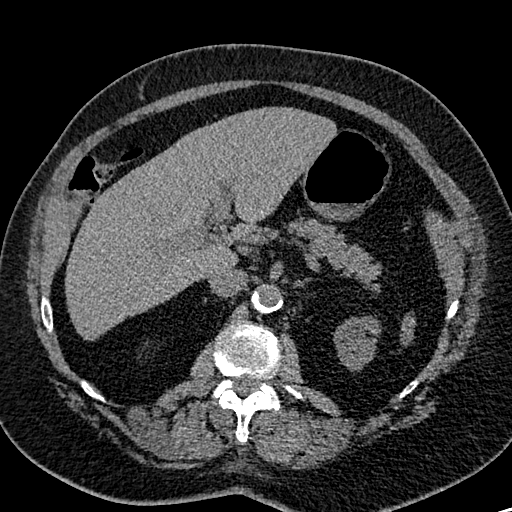
[im 12/152  lung]
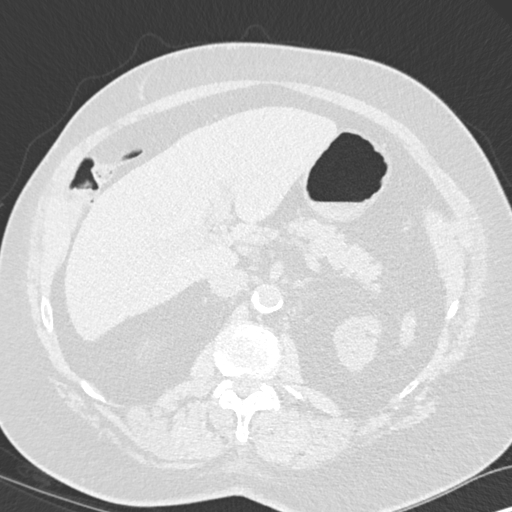
[im 23/152  lung]
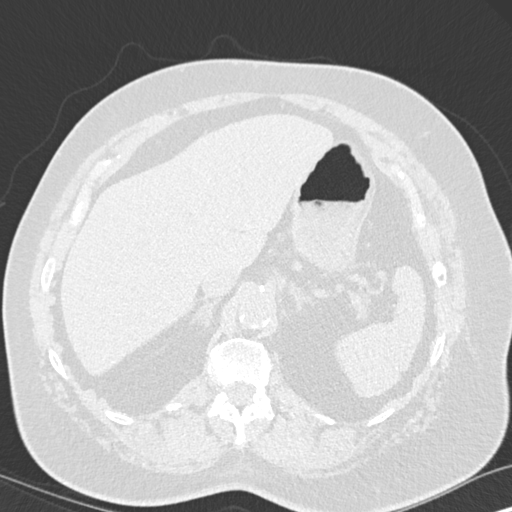
[im 34/152  lung]
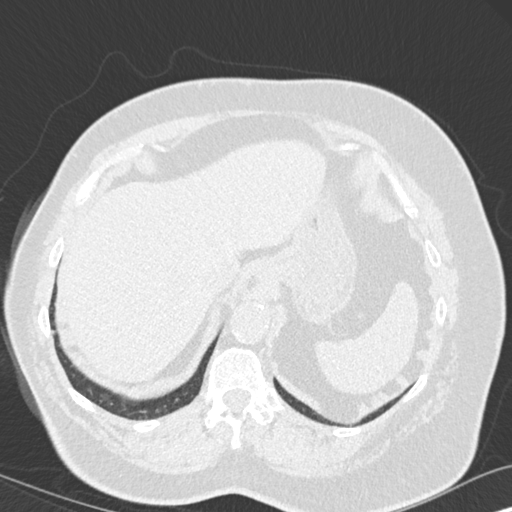
[im 45/152  lung]
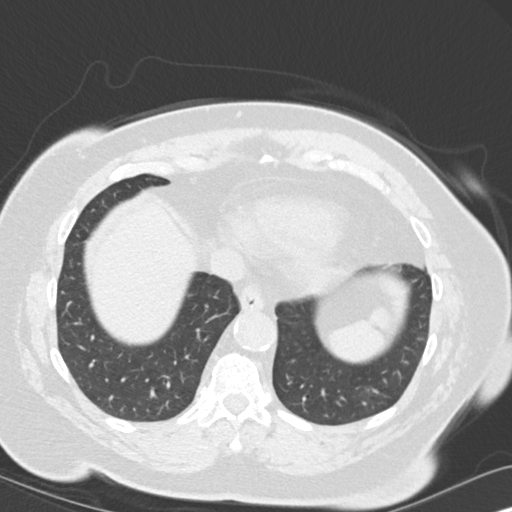
[im 56/152  mediastinal]
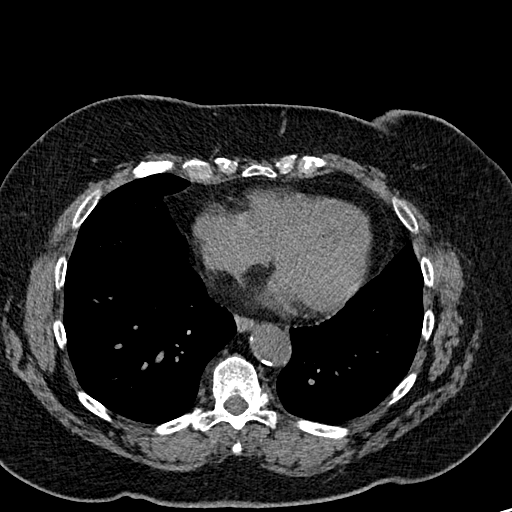
[im 56/152  lung]
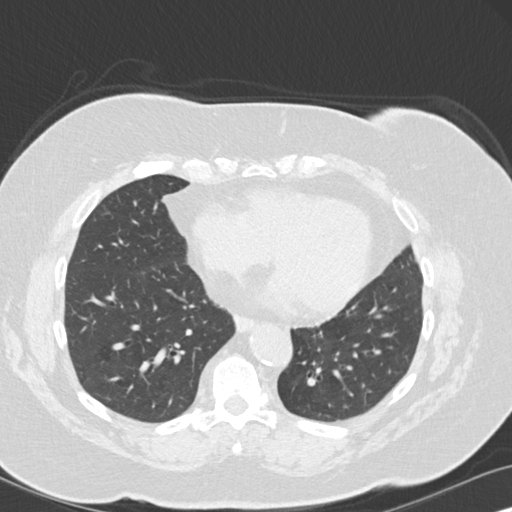
[im 68/152  lung]
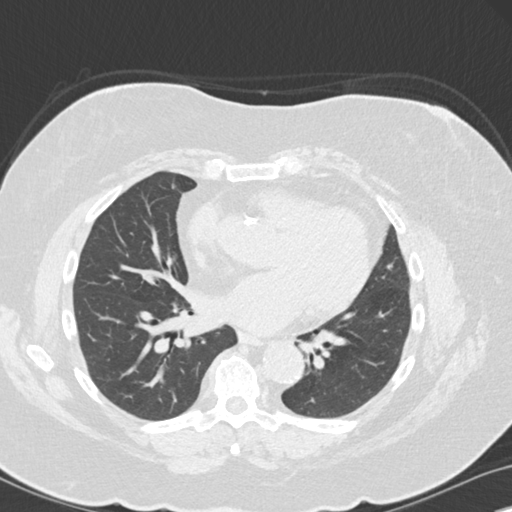
[im 84/152  lung]
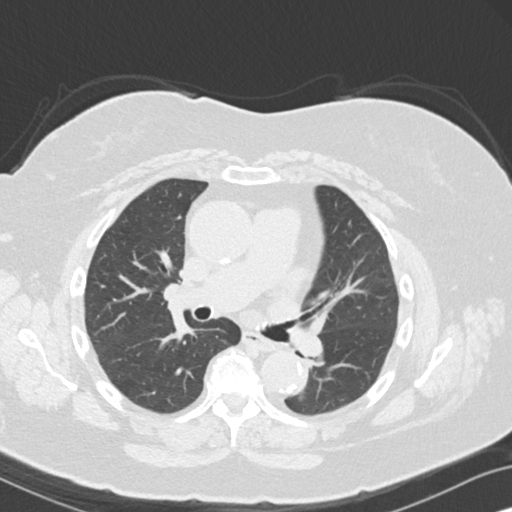
[im 96/152  lung]
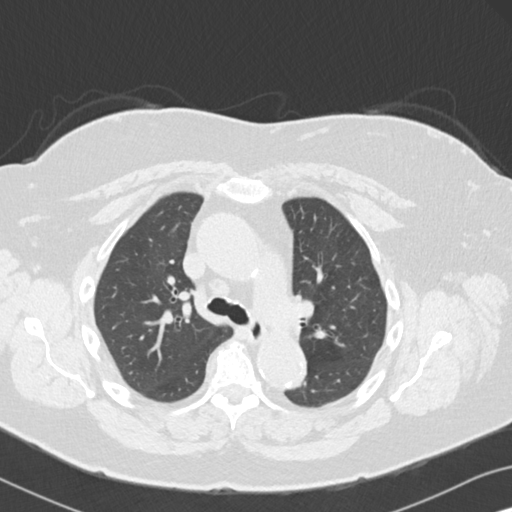
[im 107/152  mediastinal]
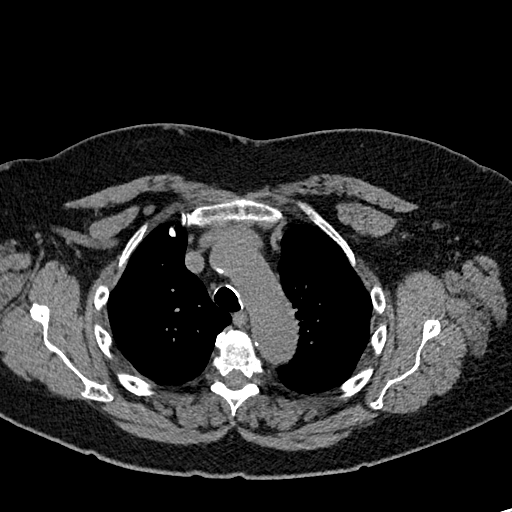
[im 107/152  lung]
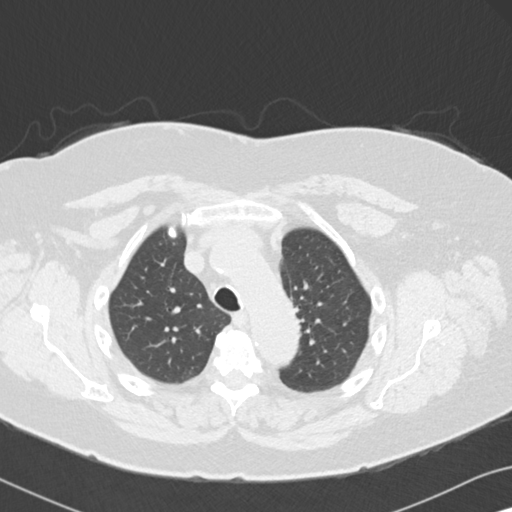
[im 118/152  lung]
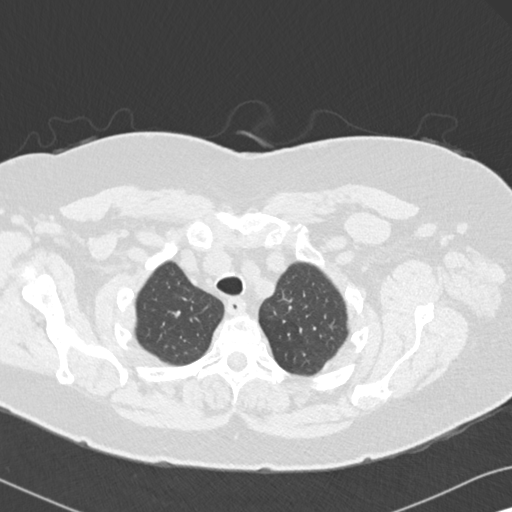
[im 129/152  lung]
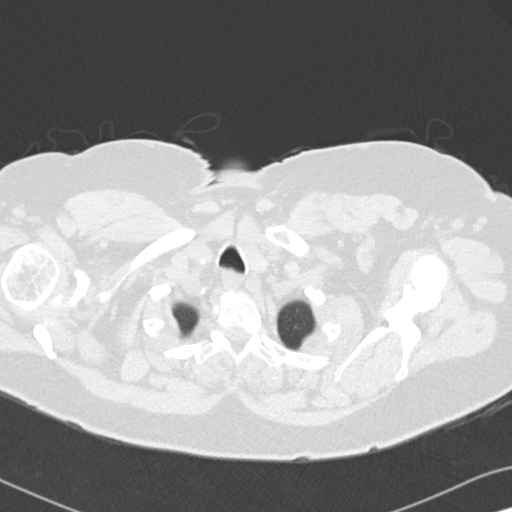
[im 140/152  lung]
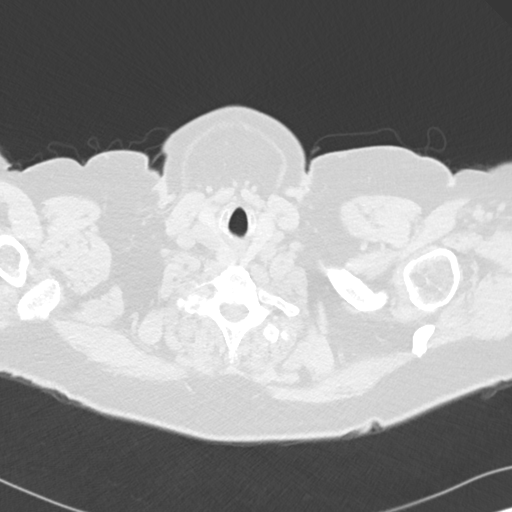

[Series 5: coronal · coronal · 0.62mm/px · 3 of 141 slices shown]
[im 29/141  lung]
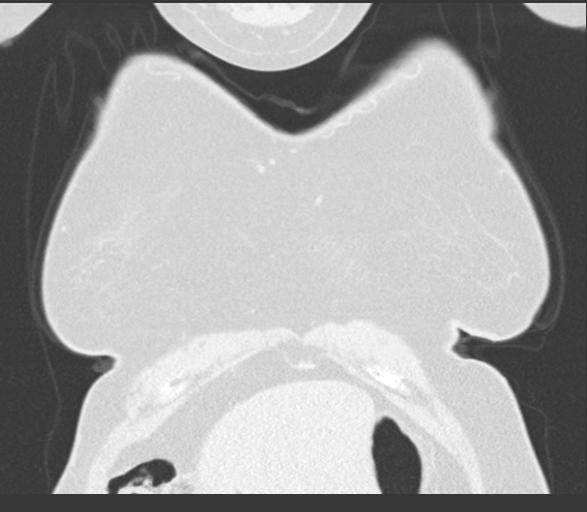
[im 57/141  lung]
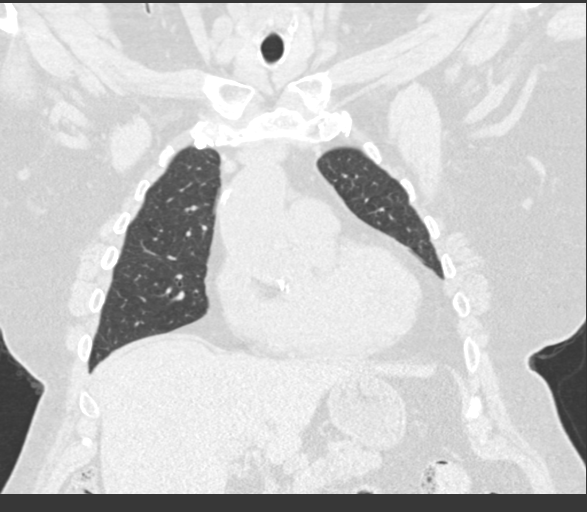
[im 85/141  lung]
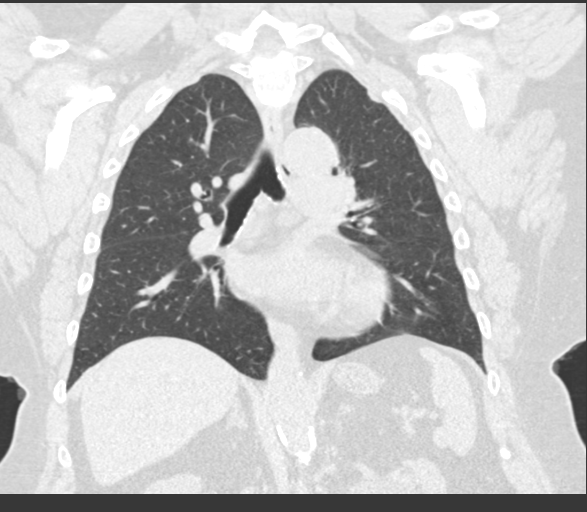

[15 of 36 positions shown; findings below may reference images not displayed]

FINDINGS: Cardiovascular: 4.3 cm ascending thoracic aortic aneurysm is noted.
Atherosclerosis of thoracic aorta is noted. Coronary calcifications
are noted. Normal cardiac size. No pericardial effusion.

Mediastinum/Nodes: No enlarged mediastinal or axillary lymph nodes.
Trachea and esophagus demonstrate no significant findings. Probable
2.3 cm nodule arising from lower pole of left thyroid lobe.

Lungs/Pleura: Lungs are clear. No pleural effusion or pneumothorax.

Upper Abdomen: Left renal calculus is noted with severe left
hydronephrosis involving visualized portion of a trophic left
kidney.

Musculoskeletal: No chest wall mass or suspicious bone lesions
identified.
IMPRESSION: 4.3 cm ascending thoracic aortic aneurysm. Recommend annual imaging
followup by CTA or MRA. This recommendation follows 4898
ACCF/AHA/AATS/ACR/ASA/SCA/RATON/MIKANDRO/ETOIL/SALMON HOROHAN Guidelines for the
Diagnosis and Management of Patients with Thoracic Aortic Disease.
Circulation. 4898; 121: [id number].

No significant mediastinal or supraclavicular adenopathy is noted on
this exam.

Coronary artery calcifications are noted suggesting coronary
disease.

Probable 2.3 cm solid nodule rising from lower pole of left thyroid
lobe. Thyroid ultrasound is recommended for further evaluation.

There appears to be severe hydronephrosis involving the visualized
portion of the left kidney with significant cortical atrophy
suggesting longstanding obstruction. 6 mm calculus is noted in upper
pole collecting system. Further evaluation with CT urogram or renal
ultrasound is recommended.

Aortic Atherosclerosis ([id number].T).

## 2020-02-12 ENCOUNTER — Other Ambulatory Visit: Payer: Self-pay | Admitting: Family Medicine

## 2020-02-12 DIAGNOSIS — F411 Generalized anxiety disorder: Secondary | ICD-10-CM

## 2020-02-19 ENCOUNTER — Other Ambulatory Visit: Payer: Self-pay | Admitting: *Deleted

## 2020-02-19 DIAGNOSIS — M545 Low back pain, unspecified: Secondary | ICD-10-CM

## 2020-02-19 MED ORDER — CYCLOBENZAPRINE HCL 10 MG PO TABS: ORAL_TABLET | ORAL | 0 refills | Status: DC

## 2020-02-19 NOTE — Telephone Encounter (Signed)
NOV. Please schedule appt with britney.

## 2020-02-22 NOTE — Telephone Encounter (Signed)
Appt made for 03/13/20.

## 2020-03-10 IMAGING — US US FNA BIOPSY THYROID 1ST LESION
1 series · 13 of 17 positions shown · non-contrast
Comparison: US Thyroid 10/19/2017

MEDICATIONS:
15 cc 1% lidocaine

COMPLICATIONS:
None immediate

INDICATION: Indeterminate thyroid nodule

2.4 cm Left inferior thyroid
EXAM:
ULTRASOUND GUIDED FINE NEEDLE ASPIRATION OF INDETERMINATE THYROID
NODULE
TECHNIQUE: Informed written consent was obtained from the patient after a
discussion of the risks, benefits and alternatives to treatment.
Questions regarding the procedure were encouraged and answered. A
timeout was performed prior to the initiation of the procedure.

[Series 1: us fna biopsy thyroid 1st lesion · 17 acquisitions, 13 frames shown]
[im 1/17]
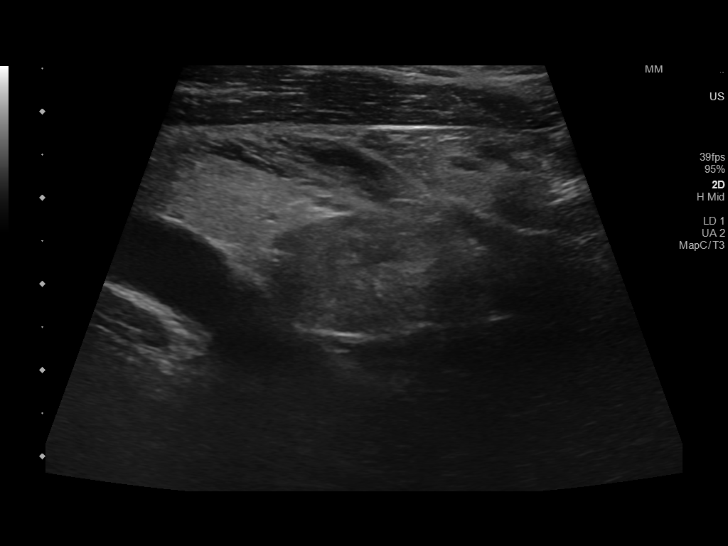
[im 2/17]
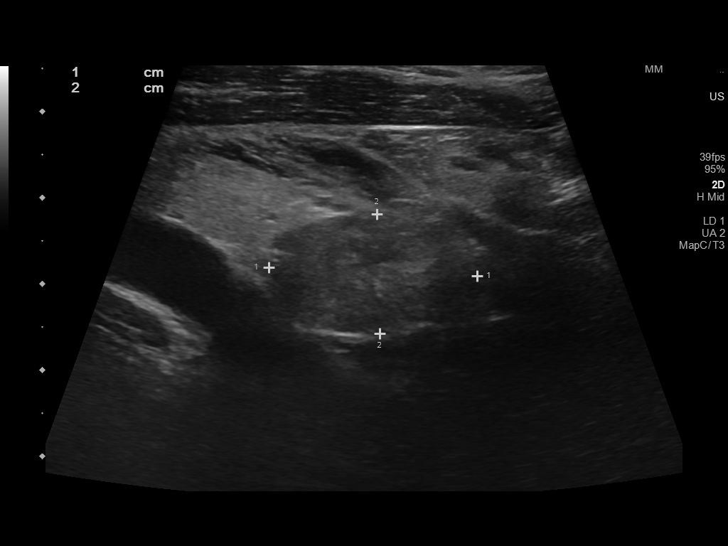
[im 4/17]
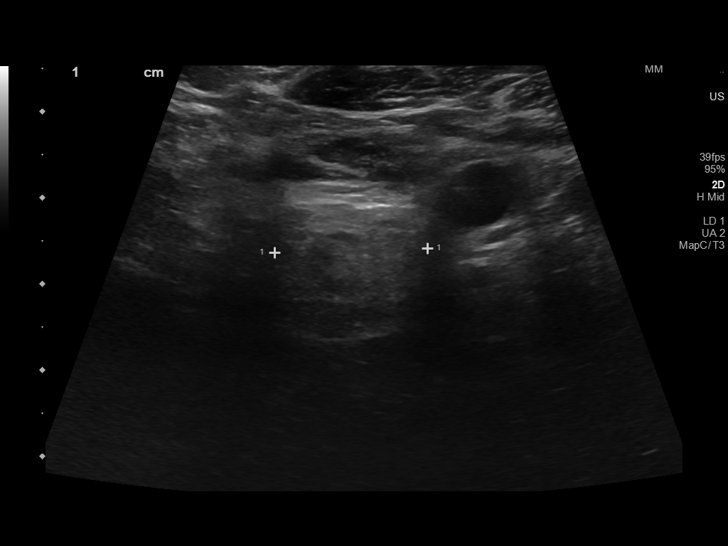
[im 5/17]
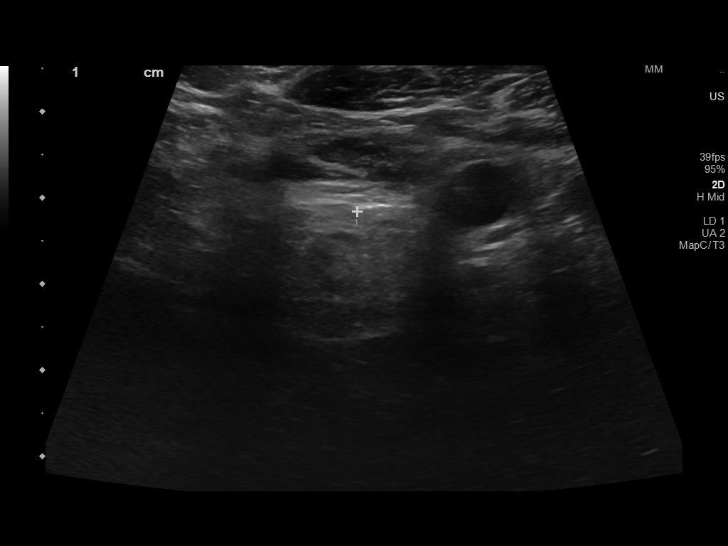
[im 6/17]
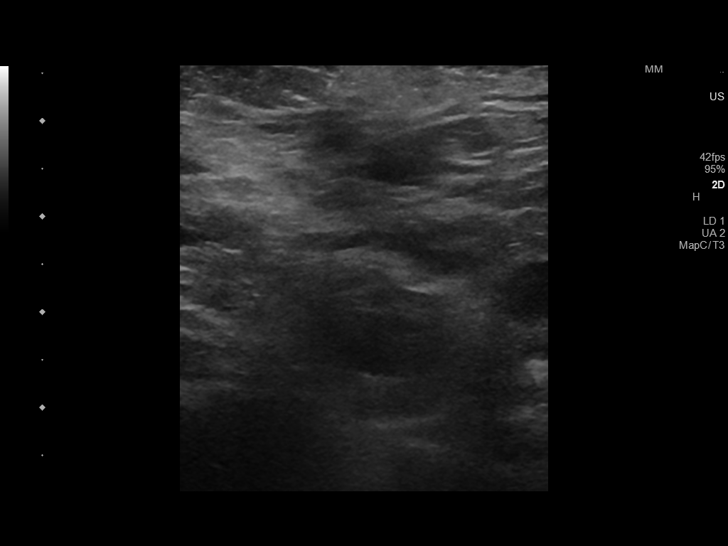
[im 8/17]
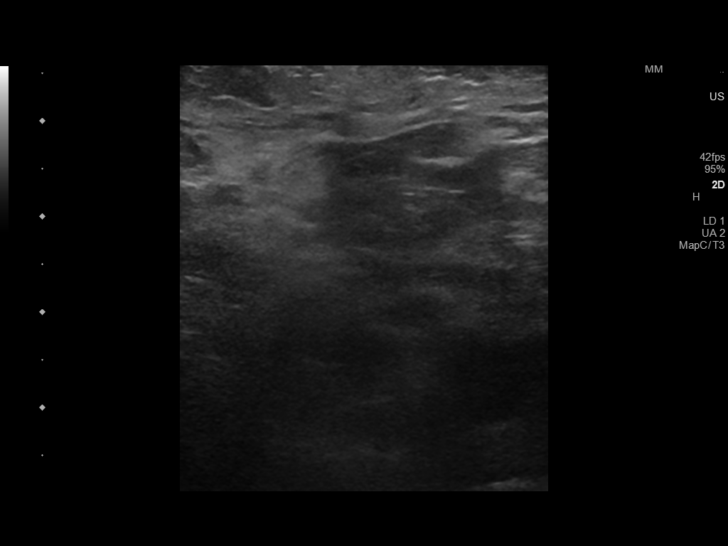
[im 9/17]
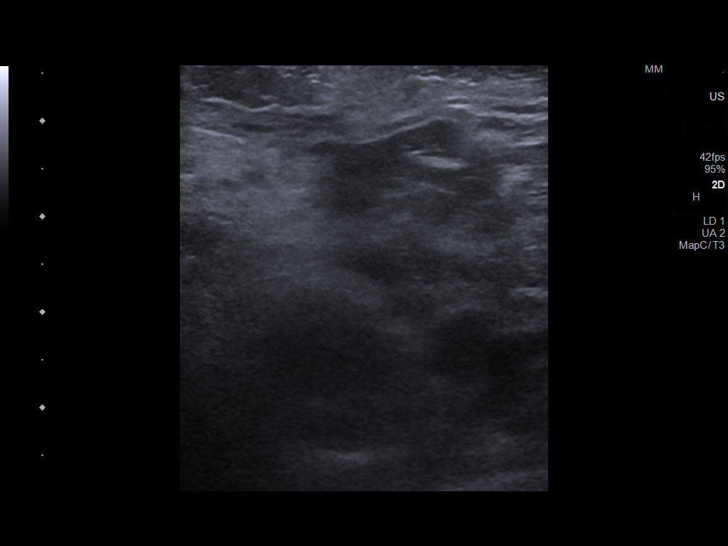
[im 10/17]
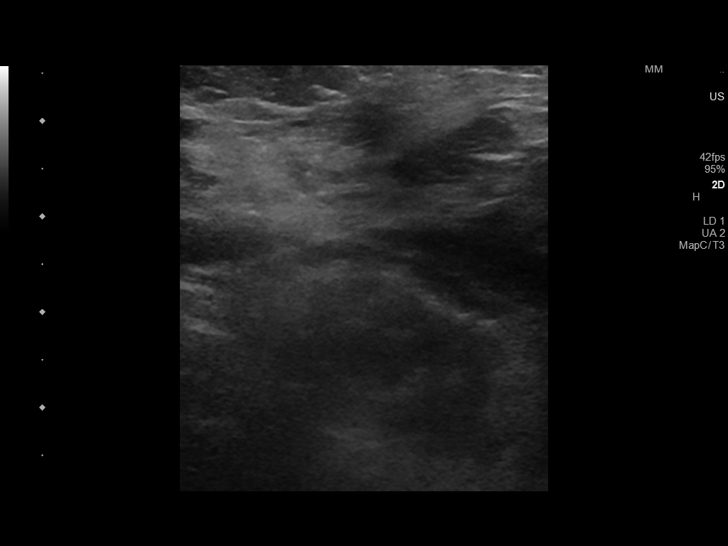
[im 12/17]
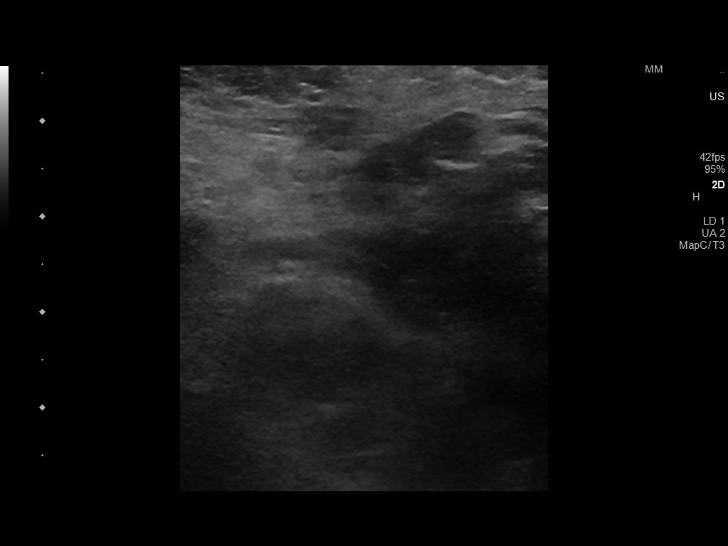
[im 13/17]
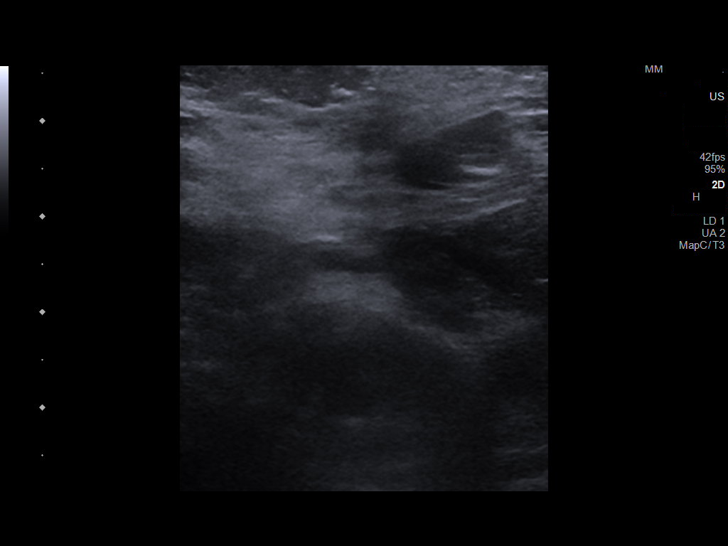
[im 14/17]
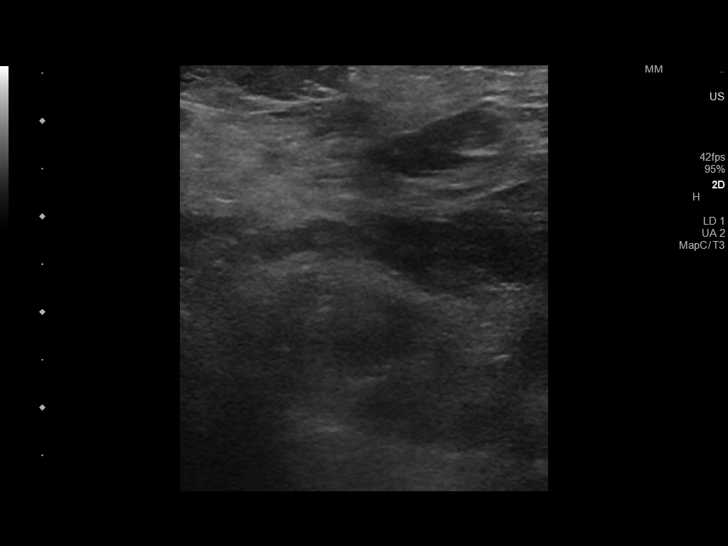
[im 16/17]
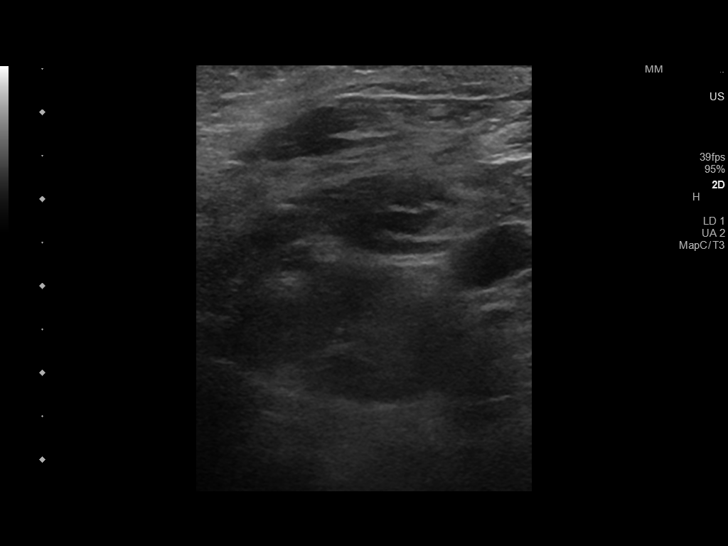
[im 17/17]
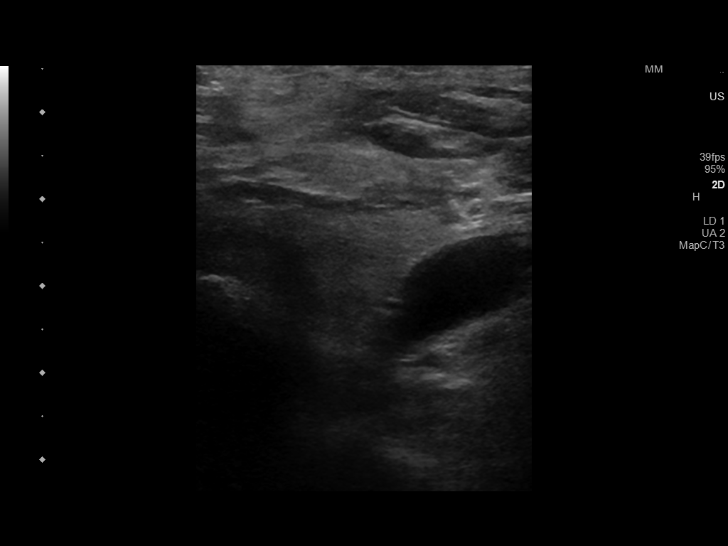

[13 of 17 positions shown; findings below may reference images not displayed]

Pre-procedural ultrasound scanning demonstrated unchanged size and
appearance of the indeterminate nodule within the left thyroid

The procedure was planned. The neck was prepped in the usual sterile
fashion, and a sterile drape was applied covering the operative
field. A timeout was performed prior to the initiation of the
procedure. Local anesthesia was provided with 1% lidocaine.

Under direct ultrasound guidance, 5 FNA biopsies were performed of
the left thyroid nodule with a 22 gauge spinal needle.

2 of these samples were obtained for AFIRMA per ordering JIMENES

Multiple ultrasound images were saved for procedural documentation
purposes. The samples were prepared and submitted to pathology.

Limited post procedural scanning was negative for hematoma or
additional complication. Dressings were placed. The patient
tolerated the above procedures procedure well without immediate
postprocedural complication.
FINDINGS: Nodule reference number based on prior diagnostic ultrasound: 3

Maximum size: 2.4 cm

Location: Left; Inferior

ACR TI-RADS risk category: TR4 (4-6 points)

Reason for biopsy: meets ACR TI-RADS criteria

Ultrasound imaging confirms appropriate placement of the needles
within the thyroid nodule.
IMPRESSION: Technically successful ultrasound guided fine needle aspiration of
left inferior thyroid nodule

Read by

Tuti Kleinman

## 2020-03-13 ENCOUNTER — Telehealth: Payer: Self-pay | Admitting: Family Medicine

## 2020-03-13 ENCOUNTER — Ambulatory Visit (INDEPENDENT_AMBULATORY_CARE_PROVIDER_SITE_OTHER): Payer: Medicare Other | Admitting: Family Medicine

## 2020-03-13 ENCOUNTER — Encounter: Payer: Self-pay | Admitting: Family Medicine

## 2020-03-13 DIAGNOSIS — E782 Mixed hyperlipidemia: Secondary | ICD-10-CM | POA: Diagnosis not present

## 2020-03-13 DIAGNOSIS — I712 Thoracic aortic aneurysm, without rupture, unspecified: Secondary | ICD-10-CM

## 2020-03-13 DIAGNOSIS — J4540 Moderate persistent asthma, uncomplicated: Secondary | ICD-10-CM

## 2020-03-13 DIAGNOSIS — F411 Generalized anxiety disorder: Secondary | ICD-10-CM

## 2020-03-13 DIAGNOSIS — G629 Polyneuropathy, unspecified: Secondary | ICD-10-CM

## 2020-03-13 DIAGNOSIS — F331 Major depressive disorder, recurrent, moderate: Secondary | ICD-10-CM

## 2020-03-13 DIAGNOSIS — E041 Nontoxic single thyroid nodule: Secondary | ICD-10-CM

## 2020-03-13 DIAGNOSIS — K219 Gastro-esophageal reflux disease without esophagitis: Secondary | ICD-10-CM

## 2020-03-13 DIAGNOSIS — M159 Polyosteoarthritis, unspecified: Secondary | ICD-10-CM

## 2020-03-13 DIAGNOSIS — M8949 Other hypertrophic osteoarthropathy, multiple sites: Secondary | ICD-10-CM

## 2020-03-13 DIAGNOSIS — J309 Allergic rhinitis, unspecified: Secondary | ICD-10-CM

## 2020-03-13 DIAGNOSIS — R7303 Prediabetes: Secondary | ICD-10-CM

## 2020-03-13 DIAGNOSIS — R110 Nausea: Secondary | ICD-10-CM

## 2020-03-13 DIAGNOSIS — M8589 Other specified disorders of bone density and structure, multiple sites: Secondary | ICD-10-CM | POA: Diagnosis not present

## 2020-03-13 MED ORDER — BUDESONIDE-FORMOTEROL FUMARATE 80-4.5 MCG/ACT IN AERO
2.0000 | INHALATION_SPRAY | Freq: Two times a day (BID) | RESPIRATORY_TRACT | 3 refills | Status: DC
Start: 2020-03-13 — End: 2020-11-21

## 2020-03-13 MED ORDER — BUSPIRONE HCL 15 MG PO TABS
15.0000 mg | ORAL_TABLET | Freq: Two times a day (BID) | ORAL | 1 refills | Status: DC
Start: 2020-03-13 — End: 2020-05-01

## 2020-03-13 MED ORDER — PRAVASTATIN SODIUM 80 MG PO TABS
80.0000 mg | ORAL_TABLET | Freq: Every day | ORAL | 1 refills | Status: DC
Start: 2020-03-13 — End: 2020-11-21

## 2020-03-13 MED ORDER — PANTOPRAZOLE SODIUM 20 MG PO TBEC
20.0000 mg | DELAYED_RELEASE_TABLET | Freq: Every day | ORAL | 1 refills | Status: DC
Start: 2020-03-13 — End: 2020-06-16

## 2020-03-13 MED ORDER — ONDANSETRON 4 MG PO TBDP: 4.0000 mg | ORAL_TABLET | Freq: Three times a day (TID) | ORAL | 2 refills | Status: DC | PRN

## 2020-03-13 MED ORDER — GABAPENTIN 300 MG PO CAPS: 300.0000 mg | ORAL_CAPSULE | Freq: Three times a day (TID) | ORAL | 1 refills | Status: DC

## 2020-03-13 MED ORDER — CYCLOBENZAPRINE HCL 10 MG PO TABS: 10.0000 mg | ORAL_TABLET | Freq: Three times a day (TID) | ORAL | 2 refills | Status: DC | PRN

## 2020-03-13 MED ORDER — CETIRIZINE HCL 10 MG PO TABS
10.0000 mg | ORAL_TABLET | Freq: Every day | ORAL | 1 refills | Status: DC
Start: 2020-03-13 — End: 2020-07-08

## 2020-03-13 NOTE — Progress Notes (Signed)
 Virtual Visit via Telephone Note  I connected with Joyce Ross on 03/13/20 at 8:19 AM by telephone and verified that I am speaking with the correct person using two identifiers. Joyce Ross is currently located at home and her son is currently with her during this visit. The provider, BRITNEY F JOYCE, FNP is located in their home at time of visit.  I discussed the limitations, risks, security and privacy concerns of performing an evaluation and management service by telephone and the availability of in person appointments. I also discussed with the patient that there may be a patient responsible charge related to this service. The patient expressed understanding and agreed to proceed.  Subjective: PCP: Joyce, Britney F, FNP  Chief Complaint  Patient presents with  . Medical Management of Chronic Issues   GERD: controlled with Protonix.   Hyperlipidemia: taking Pravastatin. Last lipid panel had improved on 07/05/2019.   Asthma: patient is using her Albuterol inhaler as needed and not daily. She is use Symbicort as prescribed.   Osteopenia: she is taking calcium and vitamin D supplements. She has been getting out and walking more for her weight bearing exercise. Her last DEXA was completed on 11/14/2019.   Osteoarthritis: patient takes Tylenol as needed.  Neuropathy: doing well with gabapentin.   Thoracic ascending aortic aneurysm: noted on chest CT on 09/28/2017. She has not had a follow-up scan since.   Prediabetes: patient has been cooking more at home instead of eating out so much. She is walking for exercise.   Depression/Anxiety: taking BuSpar and Celexa and doing well.   Depression screen PHQ 2/9 03/13/2020 10/29/2019 08/19/2019  Decreased Interest 0 0 1  Down, Depressed, Hopeless 0 1 1  PHQ - 2 Score 0 1 2  Altered sleeping 0 - 0  Tired, decreased energy 0 - 1  Change in appetite 0 - 0  Feeling bad or failure about yourself  0 - 1  Trouble concentrating 0 - 1   Moving slowly or fidgety/restless 0 - 0  Suicidal thoughts 0 - 0  PHQ-9 Score 0 - 5  Difficult doing work/chores Not difficult at all - Not difficult at all  Some recent data might be hidden   GAD 7 : Generalized Anxiety Score 03/13/2020 08/19/2019 07/05/2019  Nervous, Anxious, on Edge 0 1 3  Control/stop worrying 0 0 2  Worry too much - different things 0 1 2  Trouble relaxing 0 0 3  Restless 0 0 3  Easily annoyed or irritable 2 0 3  Afraid - awful might happen 1 0 3  Total GAD 7 Score 3 2 19  Anxiety Difficulty Not difficult at all Not difficult at all -    Patient's concern today is that she was sick at the beginning of January.  During that time she lost her taste.  She has had a decreased appetite because of this and the fact that she is nauseated and feels like she is going to throw up every time she eats.  She states she has lost 19 pounds since the first of the year.   ROS: Per HPI  Current Outpatient Medications:  .  acetaminophen (TYLENOL) 325 MG tablet, Take 325 mg by mouth every 6 (six) hours as needed for moderate pain. , Disp: , Rfl:  .  albuterol (PROVENTIL HFA;VENTOLIN HFA) 108 (90 Base) MCG/ACT inhaler, Inhale 1-2 puffs into the lungs every 6 (six) hours as needed for wheezing or shortness of breath., Disp: , Rfl:  .    budesonide-formoterol (SYMBICORT) 80-4.5 MCG/ACT inhaler, Inhale 2 puffs into the lungs 2 (two) times daily. (Patient taking differently: Inhale 2 puffs into the lungs 2 (two) times daily. ), Disp: 1 Inhaler, Rfl: 12 .  busPIRone (BUSPAR) 15 MG tablet, TAKE ONE TABLET BY MOUTH TWICE A DAY, Disp: 60 tablet, Rfl: 0 .  cetirizine (ZYRTEC) 10 MG tablet, Take 1 tablet (10 mg total) by mouth daily., Disp: 90 tablet, Rfl: 1 .  citalopram (CELEXA) 40 MG tablet, Take 1 tablet (40 mg total) by mouth daily., Disp: 90 tablet, Rfl: 3 .  cyclobenzaprine (FLEXERIL) 10 MG tablet, TAKE ONE TABLET BY MOUTH THREE TIMES A DAY AS NEEDED FOR MUSCLE SPASMS. NOV, Disp: 60 tablet,  Rfl: 0 .  gabapentin (NEURONTIN) 300 MG capsule, Take 1 capsule (300 mg total) by mouth 3 (three) times daily., Disp: 90 capsule, Rfl: 1 .  Melatonin 1 MG TABS, Take 1 mg by mouth at bedtime as needed (sleep). , Disp: , Rfl:  .  pantoprazole (PROTONIX) 20 MG tablet, Take 1 tablet (20 mg total) by mouth daily., Disp: 90 tablet, Rfl: 1 .  pravastatin (PRAVACHOL) 80 MG tablet, TAKE ONE TABLET BY MOUTH AT BEDTIME, Disp: 90 tablet, Rfl: 0  Allergies  Allergen Reactions  . Adhesive [Tape] Other (See Comments)    "leaves red places on skin"  . Metoprolol     nightmares  . Penicillins Other (See Comments)    Thrush Has patient had a PCN reaction causing immediate rash, facial/tongue/throat swelling, SOB or lightheadedness with hypotension: No Has patient had a PCN reaction causing severe rash involving mucus membranes or skin necrosis: No Has patient had a PCN reaction that required hospitalization: No Has patient had a PCN reaction occurring within the last 10 years: Yes If all of the above answers are "NO", then may proceed with Cephalosporin use.   . Radish [Raphanus Sativus] Hives  . Yellow Jacket Venom Itching and Rash   Past Medical History:  Diagnosis Date  . Coronary artery calcification seen on CAT scan 09/28/2017  . Full dentures   . GERD (gastroesophageal reflux disease)   . Heart murmur    found a couple of years ago no problems  . History of kidney stones   . History of recurrent UTIs   . Hyperlipidemia   . Left-sided Bell's palsy 1990s   residual left facial drooping  . Mild intermittent asthma    followed by pcp  . Numbness and tingling in left arm    residual from cervical neck surgery going from shoulder down to hand, not completely  . OA (osteoarthritis)    hands, spine, hips, right leg  . Osteopenia   . Peripheral neuropathy    feet  . PONV (postoperative nausea and vomiting)   . Thoracic ascending aortic aneurysm (HCC)    CT 09-28-2017--- 4.3 cm  . Thyroid  nodule    ultrasound w/ bx 09/ 2019,  per pt was benign  . White coat syndrome without hypertension     Observations/Objective: A&O  No respiratory distress or wheezing audible over the phone Mood, judgement, and thought processes all WNL   Assessment and Plan: 1. Gastroesophageal reflux disease, unspecified whether esophagitis present - Well controlled on current regimen.  - CMP14+EGFR; Future - pantoprazole (PROTONIX) 20 MG tablet; Take 1 tablet (20 mg total) by mouth daily.  Dispense: 90 tablet; Refill: 1  2. Mixed hyperlipidemia - Well controlled on current regimen.  - CBC with Differential/Platelet; Future - CMP14+EGFR; Future -   Lipid panel; Future - pravastatin (PRAVACHOL) 80 MG tablet; Take 1 tablet (80 mg total) by mouth at bedtime.  Dispense: 90 tablet; Refill: 1  3. Moderate persistent asthma without complication - Well controlled on current regimen.  - budesonide-formoterol (SYMBICORT) 80-4.5 MCG/ACT inhaler; Inhale 2 puffs into the lungs 2 (two) times daily.  Dispense: 3 each; Refill: 3  4. Osteopenia of multiple sites - Continue calcium and vitamin D supplements with weightbearing exercise. - CMP14+EGFR; Future  5. Primary osteoarthritis involving multiple joints - Well controlled on current regimen.  - CMP14+EGFR; Future  6. Neuropathy - Well controlled on current regimen.  - CMP14+EGFR; Future - gabapentin (NEURONTIN) 300 MG capsule; Take 1 capsule (300 mg total) by mouth 3 (three) times daily.  Dispense: 270 capsule; Refill: 1  7. Thoracic aortic aneurysm without rupture (HCC) - Follow-up CT scan ordered today as it has been more than 2 years since her original scan that recommended annual follow-up. - CBC with Differential/Platelet; Future - CMP14+EGFR; Future - Lipid panel; Future - CT Chest W Contrast; Future  8. Pre-diabetes - Labs to assess.  Continue diet and exercise. - CMP14+EGFR; Future - Lipid panel; Future - Bayer DCA Hb A1c Waived;  Future  9. Major depressive disorder, recurrent, moderate (HCC) - Well controlled on current regimen.  - CMP14+EGFR; Future  10. Generalized anxiety disorder - Well controlled on current regimen.  - CMP14+EGFR; Future - busPIRone (BUSPAR) 15 MG tablet; Take 1 tablet (15 mg total) by mouth 2 (two) times daily.  Dispense: 180 tablet; Refill: 1  11. Thyroid nodule - Thyroid Panel With TSH; Future  12. Nausea - ondansetron (ZOFRAN ODT) 4 MG disintegrating tablet; Take 1 tablet (4 mg total) by mouth every 8 (eight) hours as needed for nausea or vomiting.  Dispense: 30 tablet; Refill: 2  13. Allergic rhinitis, unspecified seasonality, unspecified trigger - Well controlled on current regimen.  - cetirizine (ZYRTEC) 10 MG tablet; Take 1 tablet (10 mg total) by mouth daily.  Dispense: 90 tablet; Refill: 1   Follow Up Instructions: Return in about 3 months (around 06/11/2020) for follow-up of chronic medication conditions.  I discussed the assessment and treatment plan with the patient. The patient was provided an opportunity to ask questions and all were answered. The patient agreed with the plan and demonstrated an understanding of the instructions.   The patient was advised to call back or seek an in-person evaluation if the symptoms worsen or if the condition fails to improve as anticipated.  The above assessment and management plan was discussed with the patient. The patient verbalized understanding of and has agreed to the management plan. Patient is aware to call the clinic if symptoms persist or worsen. Patient is aware when to return to the clinic for a follow-up visit. Patient educated on when it is appropriate to go to the emergency department.   Time call ended: 8:59 AM  I provided 42 minutes of non-face-to-face time during this encounter.  Britney Joyce, MSN, APRN, FNP-C [facility] 03/13/20   

## 2020-03-14 ENCOUNTER — Ambulatory Visit (INDEPENDENT_AMBULATORY_CARE_PROVIDER_SITE_OTHER): Payer: Medicare Other

## 2020-03-14 ENCOUNTER — Other Ambulatory Visit: Payer: Self-pay

## 2020-03-14 DIAGNOSIS — Z23 Encounter for immunization: Secondary | ICD-10-CM | POA: Diagnosis not present

## 2020-03-17 ENCOUNTER — Telehealth: Payer: Self-pay | Admitting: Family Medicine

## 2020-04-02 ENCOUNTER — Other Ambulatory Visit: Payer: Self-pay | Admitting: Family Medicine

## 2020-04-02 DIAGNOSIS — G629 Polyneuropathy, unspecified: Secondary | ICD-10-CM

## 2020-04-10 ENCOUNTER — Other Ambulatory Visit: Payer: Self-pay | Admitting: Family Medicine

## 2020-04-10 DIAGNOSIS — G629 Polyneuropathy, unspecified: Secondary | ICD-10-CM

## 2020-04-10 MED ORDER — GABAPENTIN 300 MG PO CAPS: 300.0000 mg | ORAL_CAPSULE | Freq: Three times a day (TID) | ORAL | 1 refills | Status: DC

## 2020-04-10 NOTE — Telephone Encounter (Signed)
°  Prescription Request  04/10/2020  What is the name of the medication or equipment? Gabapentin  Have you contacted your pharmacy to request a refill? (if applicable) yes  Which pharmacy would you like this sent to? [facility] in [place]    Patient notified that their request is being sent to the clinical staff for review and that they should receive a response within 2 business days.

## 2020-04-11 ENCOUNTER — Ambulatory Visit (HOSPITAL_COMMUNITY): Admission: RE | Admit: 2020-04-11 | Payer: Medicare Other | Source: Ambulatory Visit

## 2020-04-11 NOTE — Telephone Encounter (Signed)
Pt aware refill sent in to [facility]

## 2020-04-16 ENCOUNTER — Ambulatory Visit: Payer: Medicare Other

## 2020-04-21 ENCOUNTER — Telehealth: Payer: Self-pay

## 2020-04-23 ENCOUNTER — Ambulatory Visit: Payer: Medicare Other

## 2020-04-29 ENCOUNTER — Other Ambulatory Visit: Payer: Self-pay | Admitting: Family Medicine

## 2020-04-30 ENCOUNTER — Ambulatory Visit: Payer: Medicare Other

## 2020-05-01 ENCOUNTER — Other Ambulatory Visit: Payer: Self-pay | Admitting: *Deleted

## 2020-05-01 DIAGNOSIS — F411 Generalized anxiety disorder: Secondary | ICD-10-CM

## 2020-05-01 MED ORDER — BUSPIRONE HCL 15 MG PO TABS
15.0000 mg | ORAL_TABLET | Freq: Two times a day (BID) | ORAL | 0 refills | Status: DC
Start: 2020-05-01 — End: 2020-11-21

## 2020-05-28 ENCOUNTER — Other Ambulatory Visit: Payer: Self-pay | Admitting: Family Medicine

## 2020-05-28 DIAGNOSIS — K219 Gastro-esophageal reflux disease without esophagitis: Secondary | ICD-10-CM

## 2020-06-16 ENCOUNTER — Other Ambulatory Visit: Payer: Self-pay | Admitting: Family Medicine

## 2020-06-16 DIAGNOSIS — K219 Gastro-esophageal reflux disease without esophagitis: Secondary | ICD-10-CM

## 2020-07-07 ENCOUNTER — Other Ambulatory Visit: Payer: Self-pay | Admitting: Family Medicine

## 2020-07-07 DIAGNOSIS — J309 Allergic rhinitis, unspecified: Secondary | ICD-10-CM

## 2020-07-11 ENCOUNTER — Other Ambulatory Visit: Payer: Self-pay | Admitting: Family Medicine

## 2020-07-11 DIAGNOSIS — Z1231 Encounter for screening mammogram for malignant neoplasm of breast: Secondary | ICD-10-CM

## 2020-09-20 DIAGNOSIS — N12 Tubulo-interstitial nephritis, not specified as acute or chronic: Secondary | ICD-10-CM

## 2020-09-20 HISTORY — DX: Tubulo-interstitial nephritis, not specified as acute or chronic: N12

## 2020-09-28 ENCOUNTER — Other Ambulatory Visit: Payer: Self-pay | Admitting: Family Medicine

## 2020-09-28 DIAGNOSIS — K219 Gastro-esophageal reflux disease without esophagitis: Secondary | ICD-10-CM

## 2020-10-29 ENCOUNTER — Ambulatory Visit (INDEPENDENT_AMBULATORY_CARE_PROVIDER_SITE_OTHER): Payer: Medicare Other

## 2020-10-29 VITALS — Ht <= 58 in | Wt 168.0 lb

## 2020-10-29 DIAGNOSIS — Z0000 Encounter for general adult medical examination without abnormal findings: Secondary | ICD-10-CM | POA: Diagnosis not present

## 2020-10-29 NOTE — Patient Instructions (Signed)
Joyce Ross , Thank you for taking time to come for your Medicare Wellness Visit. I appreciate your ongoing commitment to your health goals. Please review the following plan we discussed and let me know if I can assist you in the future.   Screening recommendations/referrals: Colonoscopy: Declined (research and consider Cologuard) Mammogram: Keep appointment 11/7 at 10 and repeat annually Bone Density: Done 11/14/2019 - Repeat every 2 years  Recommended yearly ophthalmology/optometry visit for glaucoma screening and checkup Recommended yearly dental visit for hygiene and checkup  Vaccinations: Influenza vaccine: Done 03/14/2020 - repeat in fall Pneumococcal vaccine: Done 10/25/2017 - due for Pneumovax Tdap vaccine: Done 02/05/2013 - Repeat in 10 years Shingles vaccine: Due. Shingrix discussed. Please contact your pharmacy for coverage information.     Covid-19: Done 05/31/2019 & 06/22/2019 - due for booster  Advanced directives: Advance directive discussed with you today. Even though you declined this today, please call our office should you change your mind, and we can give you the proper paperwork for you to fill out.   Conditions/risks identified: Aim for 30 minutes of exercise or brisk walking each day, drink 6-8 glasses of water and eat lots of fruits and vegetables.   Next appointment: Follow up in one year for your annual wellness visit    Preventive Care 65 Years and Older, Female Preventive care refers to lifestyle choices and visits with your health care provider that can promote health and wellness. What does preventive care include? A yearly physical exam. This is also called an annual well check. Dental exams once or twice a year. Routine eye exams. Ask your health care provider how often you should have your eyes checked. Personal lifestyle choices, including: Daily care of your teeth and gums. Regular physical activity. Eating a healthy diet. Avoiding tobacco and drug  use. Limiting alcohol use. Practicing safe sex. Taking low-dose aspirin every day. Taking vitamin and mineral supplements as recommended by your health care provider. What happens during an annual well check? The services and screenings done by your health care provider during your annual well check will depend on your age, overall health, lifestyle risk factors, and family history of disease. Counseling  Your health care provider may ask you questions about your: Alcohol use. Tobacco use. Drug use. Emotional well-being. Home and relationship well-being. Sexual activity. Eating habits. History of falls. Memory and ability to understand (cognition). Work and work Statistician. Reproductive health. Screening  You may have the following tests or measurements: Height, weight, and BMI. Blood pressure. Lipid and cholesterol levels. These may be checked every 5 years, or more frequently if you are over 23 years old. Skin check. Lung cancer screening. You may have this screening every year starting at age 23 if you have a 30-pack-year history of smoking and currently smoke or have quit within the past 15 years. Fecal occult blood test (FOBT) of the stool. You may have this test every year starting at age 3. Flexible sigmoidoscopy or colonoscopy. You may have a sigmoidoscopy every 5 years or a colonoscopy every 10 years starting at age 84. Hepatitis C blood test. Hepatitis B blood test. Sexually transmitted disease (STD) testing. Diabetes screening. This is done by checking your blood sugar (glucose) after you have not eaten for a while (fasting). You may have this done every 1-3 years. Bone density scan. This is done to screen for osteoporosis. You may have this done starting at age 67. Mammogram. This may be done every 1-2 years. Talk to your health  care provider about how often you should have regular mammograms. Talk with your health care provider about your test results, treatment  options, and if necessary, the need for more tests. Vaccines  Your health care provider may recommend certain vaccines, such as: Influenza vaccine. This is recommended every year. Tetanus, diphtheria, and acellular pertussis (Tdap, Td) vaccine. You may need a Td booster every 10 years. Zoster vaccine. You may need this after age 76. Pneumococcal 13-valent conjugate (PCV13) vaccine. One dose is recommended after age 9. Pneumococcal polysaccharide (PPSV23) vaccine. One dose is recommended after age 17. Talk to your health care provider about which screenings and vaccines you need and how often you need them. This information is not intended to replace advice given to you by your health care provider. Make sure you discuss any questions you have with your health care provider. Document Released: 02/28/2015 Document Revised: 10/22/2015 Document Reviewed: 12/03/2014 Elsevier Interactive Patient Education  2017 [place] Prevention in the Home Falls can cause injuries. They can happen to people of all ages. There are many things you can do to make your home safe and to help prevent falls. What can I do on the outside of my home? Regularly fix the edges of walkways and driveways and fix any cracks. Remove anything that might make you trip as you walk through a door, such as a raised step or threshold. Trim any bushes or trees on the path to your home. Use bright outdoor lighting. Clear any walking paths of anything that might make someone trip, such as rocks or tools. Regularly check to see if handrails are loose or broken. Make sure that both sides of any steps have handrails. Any raised decks and porches should have guardrails on the edges. Have any leaves, snow, or ice cleared regularly. Use sand or salt on walking paths during winter. Clean up any spills in your garage right away. This includes oil or grease spills. What can I do in the bathroom? Use night lights. Install grab  bars by the toilet and in the tub and shower. Do not use towel bars as grab bars. Use non-skid mats or decals in the tub or shower. If you need to sit down in the shower, use a plastic, non-slip stool. Keep the floor dry. Clean up any water that spills on the floor as soon as it happens. Remove soap buildup in the tub or shower regularly. Attach bath mats securely with double-sided non-slip rug tape. Do not have throw rugs and other things on the floor that can make you trip. What can I do in the bedroom? Use night lights. Make sure that you have a light by your bed that is easy to reach. Do not use any sheets or blankets that are too big for your bed. They should not hang down onto the floor. Have a firm chair that has side arms. You can use this for support while you get dressed. Do not have throw rugs and other things on the floor that can make you trip. What can I do in the kitchen? Clean up any spills right away. Avoid walking on wet floors. Keep items that you use a lot in easy-to-reach places. If you need to reach something above you, use a strong step stool that has a grab bar. Keep electrical cords out of the way. Do not use floor polish or wax that makes floors slippery. If you must use wax, use non-skid floor wax. Do not have throw  rugs and other things on the floor that can make you trip. What can I do with my stairs? Do not leave any items on the stairs. Make sure that there are handrails on both sides of the stairs and use them. Fix handrails that are broken or loose. Make sure that handrails are as long as the stairways. Check any carpeting to make sure that it is firmly attached to the stairs. Fix any carpet that is loose or worn. Avoid having throw rugs at the top or bottom of the stairs. If you do have throw rugs, attach them to the floor with carpet tape. Make sure that you have a light switch at the top of the stairs and the bottom of the stairs. If you do not have them,  ask someone to add them for you. What else can I do to help prevent falls? Wear shoes that: Do not have high heels. Have rubber bottoms. Are comfortable and fit you well. Are closed at the toe. Do not wear sandals. If you use a stepladder: Make sure that it is fully opened. Do not climb a closed stepladder. Make sure that both sides of the stepladder are locked into place. Ask someone to hold it for you, if possible. Clearly mark and make sure that you can see: Any grab bars or handrails. First and last steps. Where the edge of each step is. Use tools that help you move around (mobility aids) if they are needed. These include: Canes. Walkers. Scooters. Crutches. Turn on the lights when you go into a dark area. Replace any light bulbs as soon as they burn out. Set up your furniture so you have a clear path. Avoid moving your furniture around. If any of your floors are uneven, fix them. If there are any pets around you, be aware of where they are. Review your medicines with your doctor. Some medicines can make you feel dizzy. This can increase your chance of falling. Ask your doctor what other things that you can do to help prevent falls. This information is not intended to replace advice given to you by your health care provider. Make sure you discuss any questions you have with your health care provider. Document Released: 11/28/2008 Document Revised: 07/10/2015 Document Reviewed: 03/08/2014 Elsevier Interactive Patient Education  2017 [employer].

## 2020-10-29 NOTE — Progress Notes (Signed)
Subjective:   Joyce Ross is a 70 y.o. female who presents for Medicare Annual (Subsequent) preventive examination.  Virtual Visit via Telephone Note  I connected with  Shade Flood on 10/29/20 at  8:15 AM EDT by telephone and verified that I am speaking with the correct person using two identifiers.  Location: Patient: Home Provider: [facility] Persons participating in the virtual visit: patient/Nurse Health Advisor   I discussed the limitations, risks, security and privacy concerns of performing an evaluation and management service by telephone and the availability of in person appointments. The patient expressed understanding and agreed to proceed.  Interactive audio and video telecommunications were attempted between this nurse and patient, however failed, due to patient having technical difficulties OR patient did not have access to video capability.  We continued and completed visit with audio only.  Some vital signs may be absent or patient reported.   Shakeria Robinette E Esthefany Herrig, LPN   Review of Systems     Cardiac Risk Factors include: advanced age (>65mn, >>38women);dyslipidemia;obesity (BMI >30kg/m2);hypertension;Other (see comment), Risk factor comments: asthma     Objective:    Today's Vitals   10/29/20 0819  Weight: 168 lb (76.2 kg)  Height: '4\' 10"'$  (1.473 m)   Body mass index is 35.11 kg/m.  Advanced Directives 10/29/2020 10/29/2019 10/27/2018 07/31/2018 07/26/2018 01/05/2018 12/05/2017  Does Patient Have a Medical Advance Directive? No No No No No No No  Would patient like information on creating a medical advance directive? No - Patient declined No - Patient declined No - Patient declined No - Patient declined Yes (MAU/Ambulatory/Procedural Areas - Information given) - No - Patient declined    Current Medications (verified) Outpatient Encounter Medications as of 10/29/2020  Medication Sig   acetaminophen (TYLENOL) 325 MG tablet Take 325 mg by mouth every 6 (six) hours  as needed for moderate pain.    albuterol (PROVENTIL HFA;VENTOLIN HFA) 108 (90 Base) MCG/ACT inhaler Inhale 1-2 puffs into the lungs every 6 (six) hours as needed for wheezing or shortness of breath.   budesonide-formoterol (SYMBICORT) 80-4.5 MCG/ACT inhaler Inhale 2 puffs into the lungs 2 (two) times daily.   busPIRone (BUSPAR) 15 MG tablet Take 1 tablet (15 mg total) by mouth 2 (two) times daily.   cetirizine (ZYRTEC) 10 MG tablet TAKE ONE TABLET BY MOUTH DAILY   cyclobenzaprine (FLEXERIL) 10 MG tablet TAKE ONE TABLET BY MOUTH THREE TIMES A DAY AS NEEDED MUSCLE SPASMS   fluticasone (FLONASE) 50 MCG/ACT nasal spray Place 2 sprays into both nostrils daily.   gabapentin (NEURONTIN) 300 MG capsule Take 1 capsule (300 mg total) by mouth 3 (three) times daily.   Melatonin 1 MG TABS Take 1 mg by mouth at bedtime as needed (sleep).    pantoprazole (PROTONIX) 20 MG tablet Take 1 tablet (20 mg total) by mouth daily. (NEEDS TO BE SEEN BEFORE NEXT REFILL)   VITAMIN D PO Take by mouth daily.   CALCIUM PO Take by mouth daily. (Patient not taking: Reported on 10/29/2020)   citalopram (CELEXA) 40 MG tablet Take 1 tablet (40 mg total) by mouth daily. (Patient not taking: Reported on 10/29/2020)   ondansetron (ZOFRAN ODT) 4 MG disintegrating tablet Take 1 tablet (4 mg total) by mouth every 8 (eight) hours as needed for nausea or vomiting. (Patient not taking: Reported on 10/29/2020)   pravastatin (PRAVACHOL) 80 MG tablet Take 1 tablet (80 mg total) by mouth at bedtime. (Patient not taking: Reported on 10/29/2020)   No facility-administered encounter  medications on file as of 10/29/2020.    Allergies (verified) Adhesive [tape], Bee pollen, Metoprolol, Penicillins, Radish [raphanus sativus], and Yellow jacket venom   History: Past Medical History:  Diagnosis Date   Coronary artery calcification seen on CAT scan 09/28/2017   Full dentures    GERD (gastroesophageal reflux disease)    Heart murmur    found a  couple of years ago no problems   History of kidney stones    History of recurrent UTIs    Hyperlipidemia    Left-sided Bell's palsy 1990s   residual left facial drooping   Mild intermittent asthma    followed by pcp   Numbness and tingling in left arm    residual from cervical neck surgery going from shoulder down to hand, not completely   OA (osteoarthritis)    hands, spine, hips, right leg   Osteopenia    Peripheral neuropathy    feet   PONV (postoperative nausea and vomiting)    Thoracic ascending aortic aneurysm (HCC)    CT 09-28-2017--- 4.3 cm   Thyroid nodule    ultrasound w/ bx 09/ 2019,  per pt was benign   White coat syndrome without hypertension    Past Surgical History:  Procedure Laterality Date   ANTERIOR CERVICAL DECOMP/DISCECTOMY FUSION  1980s   CARPAL TUNNEL RELEASE Bilateral 2000   CESAREAN SECTION  x2 last one [place], URETEROSCOPY AND STENT PLACEMENT Left 01/11/2018   Procedure: CYSTOSCOPY, URETEROSCOPY AND STENT PLACEMENT;  Surgeon: Lucas Mallow, MD;  Location: [facility];  Service: Urology;  Laterality: Left;   CYSTOSCOPY/URETEROSCOPY/HOLMIUM LASER/STENT PLACEMENT Left 07/31/2018   Procedure: CYSTOSCOPY/URETEROSCOPY/HOLMIUM LASER/STENT PLACEMENT;  Surgeon: Lucas Mallow, MD;  Location: [facility];  Service: Urology;  Laterality: Left;   EXTRACORPOREAL SHOCK WAVE LITHOTRIPSY Left 12/05/2017   Procedure: LEFT EXTRACORPOREAL SHOCK WAVE LITHOTRIPSY (ESWL);  Surgeon: Franchot Gallo, MD;  Location: [facility];  Service: Urology;  Laterality: Left;   EXTRACORPOREAL SHOCK WAVE LITHOTRIPSY  1990s   HOLMIUM LASER APPLICATION Left [id number]   Procedure: HOLMIUM LASER APPLICATION;  Surgeon: Lucas Mallow, MD;  Location: [facility];  Service: Urology;  Laterality: Left;   MOUTH SURGERY     all teeth pulled   Family History  Problem Relation Age of Onset   Heart disease Mother    Heart disease Father        heart attack    Heart disease Sister    Diabetes Brother    Diabetes Brother        sepsis   Heart disease Brother    Social History   Socioeconomic History   Marital status: Widowed    Spouse name: Not on file   Number of children: 2   Years of education: 12th Grade   Highest education level: High school graduate  Occupational History   Occupation: retired  Tobacco Use   Smoking status: Former    Years: 44.00    Types: Cigarettes    Quit date: 10/09/2012    Years since quitting: 8.0   Smokeless tobacco: Never   Tobacco comments:    since age 75  Vaping Use   Vaping Use: Never used  Substance and Sexual Activity   Alcohol use: No   Drug use: No    Comment: per pt last used "pot" 1970s   Sexual activity: Not Currently    Birth control/protection: Post-menopausal  Other Topics Concern   Not on file  Social  History Narrative   Son lives with her   Social Determinants of Health   Financial Resource Strain: Low Risk    Difficulty of Paying Living Expenses: Not hard at all  Food Insecurity: No Food Insecurity   Worried About Charity fundraiser in the Last Year: Never true   Arboriculturist in the Last Year: Never true  Transportation Needs: No Transportation Needs   Lack of Transportation (Medical): No   Lack of Transportation (Non-Medical): No  Physical Activity: Sufficiently Active   Days of Exercise per Week: 4 days   Minutes of Exercise per Session: 40 min  Stress: No Stress Concern Present   Feeling of Stress : Only a little  Social Connections: Socially Isolated   Frequency of Communication with Friends and Family: Twice a week   Frequency of Social Gatherings with Friends and Family: Never   Attends Religious Services: Never   Marine scientist or Organizations: No   Attends Archivist Meetings: Never   Marital Status: Widowed    Tobacco Counseling Counseling given: Not Answered Tobacco comments: since age 68   Clinical Intake:  Pre-visit  preparation completed: Yes  Pain : No/denies pain     BMI - recorded: 35.11 Nutritional Status: BMI > 30  Obese Nutritional Risks: None Diabetes: No  How often do you need to have someone help you when you read instructions, pamphlets, or other written materials from your doctor or pharmacy?: 1 - Never  Diabetic? Pre-diabetic   Interpreter Needed?: No  Information entered by :: Chardae Mulkern, LPN   Activities of Daily Living In your present state of health, do you have any difficulty performing the following activities: 10/29/2020  Hearing? N  Vision? N  Difficulty concentrating or making decisions? N  Walking or climbing stairs? Y  Comment difficult walking back down  Dressing or bathing? N  Doing errands, shopping? N  Preparing Food and eating ? N  Using the Toilet? N  In the past six months, have you accidently leaked urine? Y  Comment recently had UTI - uses pads for protection prn  Do you have problems with loss of bowel control? N  Managing your Medications? N  Managing your Finances? N  Housekeeping or managing your Housekeeping? N  Some recent data might be hidden    Patient Care Team: Loman Brooklyn, FNP as PCP - General (Family Medicine) Ilean China, RN as Registered Nurse  Indicate any recent Medical Services you may have received from other than [facility] providers in the past year (date may be approximate).     Assessment:   This is a routine wellness examination for [facility].  Hearing/Vision screen Hearing Screening - Comments:: C/o mild hearing difficulties  Vision Screening - Comments:: Wears glasses - up to date with annual eye exams at [facility] issues and exercise activities discussed: Current Exercise Habits: Home exercise routine, Type of exercise: walking, Time (Minutes): 45, Frequency (Times/Week): 4, Weekly Exercise (Minutes/Week): 180, Intensity: Mild, Exercise limited by: orthopedic condition(s)   Goals Addressed              This Visit's Progress    Increase physical activity   On track    [place]   On track      Depression Screen PHQ 2/9 Scores 10/29/2020 03/13/2020 10/29/2019 08/19/2019 07/05/2019 05/04/2019 10/27/2018  PHQ - 2 Score 0 0 '1 2 3 '$ 0 0  PHQ- 9 Score - 0 -  5 13 - -    Fall Risk Fall Risk  10/29/2020 10/29/2019 08/17/2019 07/05/2019 05/04/2019  Falls in the past year? 1 1 0 0 1  Number falls in past yr: 0 0 - - 0  Injury with Fall? 0 0 - - 1  Comment - - - - shoulder pain  Risk Factor Category  - - - - -  Risk for fall due to : History of fall(s);Impaired vision;Medication side effect;Orthopedic patient History of fall(s);Orthopedic patient - - -  Follow up Education provided;Falls prevention discussed - - - -  Comment - - - - -    FALL RISK PREVENTION PERTAINING TO THE HOME:  Any stairs in or around the home? Yes  If so, are there any without handrails? Yes  - advised to have these installed or avoid stairs altogether Home free of loose throw rugs in walkways, pet beds, electrical cords, etc? Yes  Adequate lighting in your home to reduce risk of falls? Yes   ASSISTIVE DEVICES UTILIZED TO PREVENT FALLS:  Life alert? No  Use of a cane, walker or w/c? Yes  Grab bars in the bathroom? No  Shower chair or bench in shower? No  Elevated toilet seat or a handicapped toilet? No   TIMED UP AND GO:  Was the test performed? No . Telephonic visit  Cognitive Function: Normal cognitive status assessed by direct observation by this Nurse Health Advisor. No abnormalities found.    MMSE - Mini Mental State Exam 10/25/2017  Orientation to time 5  Orientation to Place 5  Registration 3  Attention/ Calculation 5  Recall 3  Language- name 2 objects 2  Language- repeat 1  Language- follow 3 step command 3  Language- read & follow direction 1  Write a sentence 1  Copy design 1  Total score 30     6CIT Screen 10/29/2019 10/27/2018  What Year? 0 points 0 points  What month?  0 points 0 points  What time? 0 points 0 points  Count back from 20 0 points 0 points  Months in reverse 0 points 0 points  Repeat phrase 0 points 6 points  Total Score 0 6    Immunizations Immunization History  Administered Date(s) Administered   Fluad Quad(high Dose 65+) 03/14/2020   PFIZER(Purple Top)SARS-COV-2 Vaccination 05/31/2019, 06/22/2019   Pneumococcal Conjugate-13 10/25/2017   Tdap 02/05/2013    TDAP status: Up to date  Flu Vaccine status: Up to date  Pneumococcal vaccine status: Due, Education has been provided regarding the importance of this vaccine. Advised may receive this vaccine at local pharmacy or Health Dept. Aware to provide a copy of the vaccination record if obtained from local pharmacy or Health Dept. Verbalized acceptance and understanding.  Covid-19 vaccine status: Information provided on how to obtain vaccines.   Qualifies for Shingles Vaccine? Yes   Zostavax completed No   Shingrix Completed?: No.    Education has been provided regarding the importance of this vaccine. Patient has been advised to call insurance company to determine out of pocket expense if they have not yet received this vaccine. Advised may also receive vaccine at local pharmacy or Health Dept. Verbalized acceptance and understanding.  Screening Tests Health Maintenance  Topic Date Due   Zoster Vaccines- Shingrix (1 of 2) Never done   COLONOSCOPY (Pts 45-58yr Insurance coverage will need to be confirmed)  Never done   PNA vac Low Risk Adult (2 of 2 - PPSV23) 10/26/2018   COVID-19 Vaccine (3 -  Pfizer risk series) 07/20/2019   MAMMOGRAM  01/14/2020   INFLUENZA VACCINE  09/15/2020   DEXA SCAN  11/13/2021   TETANUS/TDAP  02/06/2023   Hepatitis C Screening  Completed   HPV VACCINES  Aged Out    Health Maintenance  Health Maintenance Due  Topic Date Due   Zoster Vaccines- Shingrix (1 of 2) Never done   COLONOSCOPY (Pts 45-16yr Insurance coverage will need to be confirmed)   Never done   PNA vac Low Risk Adult (2 of 2 - PPSV23) 10/26/2018   COVID-19 Vaccine (3 - Pfizer risk series) 07/20/2019   MAMMOGRAM  01/14/2020   INFLUENZA VACCINE  09/15/2020    Colorectal cancer screening: Declined  Mammogram status: Ordered 09/2020. Pt provided with contact info and advised to call to schedule appt.  Has appt 12/22/20 @ 10  Bone Density status: Completed 11/14/2019. Results reflect: Bone density results: OSTEOPENIA. Repeat every 2 years.  Lung Cancer Screening: (Low Dose CT Chest recommended if Age 70-80years, 30 pack-year currently smoking OR have quit w/in 15years.) does not qualify.   Additional Screening:  Hepatitis C Screening: does qualify; Completed 10/10/2015  Vision Screening: Recommended annual ophthalmology exams for early detection of glaucoma and other disorders of the eye. Is the patient up to date with their annual eye exam?  Yes  Who is the provider or what is the name of the office in which the patient attends annual eye exams? [facility] SarIf pt is not established with a provider, would they like to be referred to a provider to establish care? No .   Dental Screening: Recommended annual dental exams for proper oral hygiene  Community Resource Referral / Chronic Care Management: CRR required this visit?  No   CCM required this visit?  No      Plan:     I have personally reviewed and noted the following in the patient's chart:   Medical and social history Use of alcohol, tobacco or illicit drugs  Current medications and supplements including opioid prescriptions.  Functional ability and status Nutritional status Physical activity Advanced directives List of other physicians Hospitalizations, surgeries, and ER visits in previous 12 months Vitals Screenings to include cognitive, depression, and falls Referrals and appointments  In addition, I have reviewed and discussed with patient certain preventive protocols, quality metrics, and best  practice recommendations. A written personalized care plan for preventive services as well as general preventive health recommendations were provided to patient.     ASandrea Hammond LPN   [id number]  Nurse Notes: Declined colonoscopy - c/o financial strains, but declined CRR.

## 2020-11-21 ENCOUNTER — Ambulatory Visit (INDEPENDENT_AMBULATORY_CARE_PROVIDER_SITE_OTHER): Payer: Medicare Other | Admitting: Family Medicine

## 2020-11-21 ENCOUNTER — Other Ambulatory Visit: Payer: Self-pay

## 2020-11-21 ENCOUNTER — Encounter: Payer: Self-pay | Admitting: Family Medicine

## 2020-11-21 VITALS — BP 165/93 | HR 79 | Temp 98.0°F | Ht <= 58 in | Wt 171.2 lb

## 2020-11-21 DIAGNOSIS — M159 Polyosteoarthritis, unspecified: Secondary | ICD-10-CM

## 2020-11-21 DIAGNOSIS — R0681 Apnea, not elsewhere classified: Secondary | ICD-10-CM

## 2020-11-21 DIAGNOSIS — J4540 Moderate persistent asthma, uncomplicated: Secondary | ICD-10-CM

## 2020-11-21 DIAGNOSIS — M8589 Other specified disorders of bone density and structure, multiple sites: Secondary | ICD-10-CM

## 2020-11-21 DIAGNOSIS — I7121 Aneurysm of the ascending aorta, without rupture: Secondary | ICD-10-CM

## 2020-11-21 DIAGNOSIS — K219 Gastro-esophageal reflux disease without esophagitis: Secondary | ICD-10-CM

## 2020-11-21 DIAGNOSIS — F331 Major depressive disorder, recurrent, moderate: Secondary | ICD-10-CM

## 2020-11-21 DIAGNOSIS — R7303 Prediabetes: Secondary | ICD-10-CM

## 2020-11-21 DIAGNOSIS — F339 Major depressive disorder, recurrent, unspecified: Secondary | ICD-10-CM

## 2020-11-21 DIAGNOSIS — E782 Mixed hyperlipidemia: Secondary | ICD-10-CM | POA: Diagnosis not present

## 2020-11-21 DIAGNOSIS — Z23 Encounter for immunization: Secondary | ICD-10-CM | POA: Diagnosis not present

## 2020-11-21 DIAGNOSIS — F411 Generalized anxiety disorder: Secondary | ICD-10-CM | POA: Diagnosis not present

## 2020-11-21 DIAGNOSIS — G629 Polyneuropathy, unspecified: Secondary | ICD-10-CM

## 2020-11-21 DIAGNOSIS — E041 Nontoxic single thyroid nodule: Secondary | ICD-10-CM

## 2020-11-21 LAB — BAYER DCA HB A1C WAIVED: HB A1C (BAYER DCA - WAIVED): 5.7 % — ABNORMAL HIGH (ref 4.8–5.6)

## 2020-11-21 MED ORDER — PRAVASTATIN SODIUM 80 MG PO TABS: 80.0000 mg | ORAL_TABLET | Freq: Every day | ORAL | 1 refills | Status: DC

## 2020-11-21 MED ORDER — CITALOPRAM HYDROBROMIDE 40 MG PO TABS: 40.0000 mg | ORAL_TABLET | Freq: Every day | ORAL | 1 refills | Status: DC

## 2020-11-21 MED ORDER — BUSPIRONE HCL 15 MG PO TABS: 15.0000 mg | ORAL_TABLET | Freq: Two times a day (BID) | ORAL | 1 refills | Status: DC

## 2020-11-21 MED ORDER — TRELEGY ELLIPTA 100-62.5-25 MCG/INH IN AEPB: 1.0000 | INHALATION_SPRAY | Freq: Every day | RESPIRATORY_TRACT | 2 refills | Status: DC

## 2020-11-21 MED ORDER — PANTOPRAZOLE SODIUM 20 MG PO TBEC: 20.0000 mg | DELAYED_RELEASE_TABLET | Freq: Every day | ORAL | 1 refills | Status: DC

## 2020-11-21 MED ORDER — CYCLOBENZAPRINE HCL 10 MG PO TABS: ORAL_TABLET | ORAL | 2 refills | Status: DC

## 2020-11-21 MED ORDER — GABAPENTIN 300 MG PO CAPS: 300.0000 mg | ORAL_CAPSULE | Freq: Three times a day (TID) | ORAL | 1 refills | Status: DC

## 2020-11-21 NOTE — Progress Notes (Signed)
Assessment & Plan:  1. Mixed hyperlipidemia Labs to assess.  - Lipid panel - CMP14+EGFR - pravastatin (PRAVACHOL) 80 MG tablet; Take 1 tablet (80 mg total) by mouth at bedtime.  Dispense: 90 tablet; Refill: 1  2. Pre-diabetes Well controlled with diet. A1c decreased from 6.3 to 5.7. - Bayer DCA Hb A1c Waived  3. Aneurysm of ascending aorta without rupture Needs updated CT scan. - Lipid panel - CT ANGIO CHEST AORTA W/CM & OR WO/CM; Future - aspirin EC 81 MG tablet; Take 81 mg by mouth daily. Swallow whole.  4. Generalized anxiety disorder Well controlled on current regimen.  - CMP14+EGFR - busPIRone (BUSPAR) 15 MG tablet; Take 1 tablet (15 mg total) by mouth 2 (two) times daily.  Dispense: 180 tablet; Refill: 1 - citalopram (CELEXA) 40 MG tablet; Take 1 tablet (40 mg total) by mouth daily.  Dispense: 90 tablet; Refill: 1  5. Major depressive disorder, recurrent, moderate (HCC) Well controlled on current regimen.  - CMP14+EGFR - citalopram (CELEXA) 40 MG tablet; Take 1 tablet (40 mg total) by mouth daily.  Dispense: 90 tablet; Refill: 1  6. Neuropathy Well controlled on current regimen.  - CMP14+EGFR - gabapentin (NEURONTIN) 300 MG capsule; Take 1 capsule (300 mg total) by mouth 3 (three) times daily.  Dispense: 270 capsule; Refill: 1  7. Gastroesophageal reflux disease, unspecified whether esophagitis present Well controlled on current regimen.  - CMP14+EGFR - pantoprazole (PROTONIX) 20 MG tablet; Take 1 tablet (20 mg total) by mouth daily.  Dispense: 90 tablet; Refill: 1  8. Moderate persistent asthma without complication Uncontrolled. Switched from Symbicort to Trelegy.  - Fluticasone-Umeclidin-Vilant (TRELEGY ELLIPTA) 100-62.5-25 MCG/INH AEPB; Inhale 1 puff into the lungs daily.  Dispense: 60 each; Refill: 2  9. Osteopenia of multiple sites Continue calcium and vitamin D supplements, as well as weight bearing exercises.   10. Primary osteoarthritis involving  multiple joints Continue Tylenol for pain relief.  11. Episode of apnea - Ambulatory referral to Pulmonology  12. Thyroid nodule Needs repeat ultrasound. - US THYROID; Future  13. Need for vaccination - Pneumococcal conjugate vaccine 20-valent (Prevnar 20) - given in office.  14. Need for immunization against influenza - Flu Vaccine QUAD High Dose(Fluad) - given in office.    Return in about 3 months (around 02/21/2021) for follow-up of chronic medication conditions.  Hendricks Limes, MSN, APRN, FNP-C [facility]  Subjective:    Patient ID: BRIONNE MERTZ, female    DOB: Jul 17, 1950, 70 y.o.   MRN: [id number]  Patient Care Team: Loman Brooklyn, FNP as PCP - General (Family Medicine) Ilean China, RN as Registered Nurse Marin Comment, Allison Quarry, MD as Referring Physician (Optometry) Lucas Mallow, MD as Consulting Physician (Urology)   Chief Complaint:  Chief Complaint  Patient presents with   Prediabetes   Hyperlipidemia    Check up of chronic medical conditions     HPI: Joyce Ross is a 70 y.o. female presenting on 11/21/2020 for Prediabetes and Hyperlipidemia (Check up of chronic medical conditions )  GERD: controlled with Protonix.    Hyperlipidemia: taking Pravastatin. Last lipid panel had improved on 07/05/2019.    Asthma: patient is using her Albuterol inhaler every other day. She is using Symbicort as prescribed.    Osteopenia: she is taking calcium and vitamin D supplements. She has been getting out and walking more for her weight bearing exercise. Her last DEXA was completed on 11/14/2019.    Osteoarthritis: patient takes  Tylenol as needed.   Neuropathy: doing well with gabapentin.    Thoracic ascending aortic aneurysm: noted on chest CT on 09/28/2017. She has not had a follow-up scan since. She missed the CT that was scheduled after our last visit earlier this year because she had a lot going on at the time. She is agreeable to  repeating the CT scan.    Prediabetes: patient has been cooking more at home instead of eating out so much. She is walking for exercise.    Depression/Anxiety: taking BuSpar and Celexa and doing well.   Depression screen [facility] 2/9 11/21/2020 10/29/2020 03/13/2020  Decreased Interest 1 0 0  Down, Depressed, Hopeless 0 0 0  PHQ - 2 Score 1 0 0  Altered sleeping 1 - 0  Tired, decreased energy 0 - 0  Change in appetite 0 - 0  Feeling bad or failure about yourself  1 - 0  Trouble concentrating 0 - 0  Moving slowly or fidgety/restless 0 - 0  Suicidal thoughts 0 - 0  PHQ-9 Score 3 - 0  Difficult doing work/chores Somewhat difficult - Not difficult at all  Some recent data might be hidden   GAD 7 : Generalized Anxiety Score 11/21/2020 03/13/2020 08/19/2019 07/05/2019  Nervous, Anxious, on Edge 1 0 1 3  Control/stop worrying 0 0 0 2  Worry too much - different things 1 0 1 2  Trouble relaxing 0 0 0 3  Restless 0 0 0 3  Easily annoyed or irritable 1 2 0 3  Afraid - awful might happen 1 1 0 3  Total GAD 7 Score '4 3 2 19  ' Anxiety Difficulty Somewhat difficult Not difficult at all Not difficult at all -   Elevated blood pressure: patient reports her blood pressure at home is 110s/60s. She has a diagnosis of white coat syndrome without hypertension.   Thyroid nodule: patient had a thyroid nodule biopsy completed on 11/09/2017 which resulted as BENIGN FOLLICULAR NODULE (BETHESDA CATEGORY II). She is due for a repeat thyroid ultrasound.  New complaints: Patient reports her son told her she pauses in her breathing at night while sleeping. She is not sure if she snores or not. She is interested in having a sleep study completed. Her BMI is 35.79.    Social history:  Relevant past medical, surgical, family and social history reviewed and updated as indicated. Interim medical history since our last visit reviewed.  Allergies and medications reviewed and updated.  DATA REVIEWED: CHART IN EPIC  ROS:  Negative unless specifically indicated above in HPI.    Current Outpatient Medications:    acetaminophen (TYLENOL) 325 MG tablet, Take 325 mg by mouth every 6 (six) hours as needed for moderate pain. , Disp: , Rfl:    albuterol (PROVENTIL HFA;VENTOLIN HFA) 108 (90 Base) MCG/ACT inhaler, Inhale 1-2 puffs into the lungs every 6 (six) hours as needed for wheezing or shortness of breath., Disp: , Rfl:    budesonide-formoterol (SYMBICORT) 80-4.5 MCG/ACT inhaler, Inhale 2 puffs into the lungs 2 (two) times daily., Disp: 3 each, Rfl: 3   busPIRone (BUSPAR) 15 MG tablet, Take 1 tablet (15 mg total) by mouth 2 (two) times daily., Disp: 180 tablet, Rfl: 0   CALCIUM PO, Take by mouth daily., Disp: , Rfl:    cetirizine (ZYRTEC) 10 MG tablet, TAKE ONE TABLET BY MOUTH DAILY, Disp: 90 tablet, Rfl: 1   citalopram (CELEXA) 40 MG tablet, Take 1 tablet (40 mg total) by mouth daily., Disp: 90  tablet, Rfl: 3   cyclobenzaprine (FLEXERIL) 10 MG tablet, TAKE ONE TABLET BY MOUTH THREE TIMES A DAY AS NEEDED MUSCLE SPASMS, Disp: 60 tablet, Rfl: 2   fluticasone (FLONASE) 50 MCG/ACT nasal spray, Place 2 sprays into both nostrils daily., Disp: , Rfl:    gabapentin (NEURONTIN) 300 MG capsule, Take 1 capsule (300 mg total) by mouth 3 (three) times daily., Disp: 270 capsule, Rfl: 1   Melatonin 1 MG TABS, Take 1 mg by mouth at bedtime as needed (sleep). , Disp: , Rfl:    ondansetron (ZOFRAN ODT) 4 MG disintegrating tablet, Take 1 tablet (4 mg total) by mouth every 8 (eight) hours as needed for nausea or vomiting., Disp: 30 tablet, Rfl: 2   pantoprazole (PROTONIX) 20 MG tablet, Take 1 tablet (20 mg total) by mouth daily. (NEEDS TO BE SEEN BEFORE NEXT REFILL), Disp: 30 tablet, Rfl: 0   pravastatin (PRAVACHOL) 80 MG tablet, Take 1 tablet (80 mg total) by mouth at bedtime., Disp: 90 tablet, Rfl: 1   VITAMIN D PO, Take by mouth daily., Disp: , Rfl:    Allergies  Allergen Reactions   Adhesive [Tape] Other (See Comments)    "leaves  red places on skin"   Bee Pollen    Metoprolol     nightmares   Penicillins Other (See Comments)    Thrush Has patient had a PCN reaction causing immediate rash, facial/tongue/throat swelling, SOB or lightheadedness with hypotension: No Has patient had a PCN reaction causing severe rash involving mucus membranes or skin necrosis: No Has patient had a PCN reaction that required hospitalization: No Has patient had a PCN reaction occurring within the last 10 years: Yes If all of the above answers are "NO", then may proceed with Cephalosporin use.    Radish [Raphanus Sativus] Hives   Yellow Jacket Venom Itching and Rash   Past Medical History:  Diagnosis Date   Coronary artery calcification seen on CAT scan 09/28/2017   Full dentures    GERD (gastroesophageal reflux disease)    Heart murmur    found a couple of years ago no problems   History of kidney stones    History of recurrent UTIs    Hyperlipidemia    Left-sided Bell's palsy 1990s   residual left facial drooping   Mild intermittent asthma    followed by pcp   Numbness and tingling in left arm    residual from cervical neck surgery going from shoulder down to hand, not completely   OA (osteoarthritis)    hands, spine, hips, right leg   Osteopenia    Peripheral neuropathy    feet   PONV (postoperative nausea and vomiting)    Pyelonephritis 09/20/2020   Thoracic ascending aortic aneurysm    CT 09-28-2017--- 4.3 cm   Thyroid nodule    ultrasound w/ bx 09/ 2019,  per pt was benign   White coat syndrome without hypertension     Past Surgical History:  Procedure Laterality Date   ANTERIOR CERVICAL DECOMP/DISCECTOMY FUSION  1980s   CARPAL TUNNEL RELEASE Bilateral 2000   CESAREAN SECTION  x2 last one [place], URETEROSCOPY AND STENT PLACEMENT Left 01/11/2018   Procedure: CYSTOSCOPY, URETEROSCOPY AND STENT PLACEMENT;  Surgeon: Lucas Mallow, MD;  Location: [facility];  Service: Urology;   Laterality: Left;   CYSTOSCOPY/URETEROSCOPY/HOLMIUM LASER/STENT PLACEMENT Left 07/31/2018   Procedure: CYSTOSCOPY/URETEROSCOPY/HOLMIUM LASER/STENT PLACEMENT;  Surgeon: Lucas Mallow, MD;  Location: [facility];  Service: Urology;  Laterality: Left;   EXTRACORPOREAL SHOCK WAVE LITHOTRIPSY Left 12/05/2017   Procedure: LEFT EXTRACORPOREAL SHOCK WAVE LITHOTRIPSY (ESWL);  Surgeon: Franchot Gallo, MD;  Location: [facility];  Service: Urology;  Laterality: Left;   EXTRACORPOREAL SHOCK WAVE LITHOTRIPSY  1990s   HOLMIUM LASER APPLICATION Left 52/84/1324   Procedure: HOLMIUM LASER APPLICATION;  Surgeon: Lucas Mallow, MD;  Location: [facility];  Service: Urology;  Laterality: Left;   MOUTH SURGERY     all teeth pulled    Social History   Socioeconomic History   Marital status: Widowed    Spouse name: Not on file   Number of children: 2   Years of education: 12th Grade   Highest education level: High school graduate  Occupational History   Occupation: retired  Tobacco Use   Smoking status: Former    Years: 44.00    Types: Cigarettes    Quit date: 10/09/2012    Years since quitting: 8.1   Smokeless tobacco: Never   Tobacco comments:    since age 24  Vaping Use   Vaping Use: Never used  Substance and Sexual Activity   Alcohol use: No   Drug use: No    Comment: per pt last used "pot" 1970s   Sexual activity: Not Currently    Birth control/protection: Post-menopausal  Other Topics Concern   Not on file  Social History Narrative   Son lives with her   Social Determinants of Health   Financial Resource Strain: Low Risk    Difficulty of Paying Living Expenses: Not hard at all  Food Insecurity: No Food Insecurity   Worried About Charity fundraiser in the Last Year: Never true   Arboriculturist in the Last Year: Never true  Transportation Needs: No Transportation Needs   Lack of Transportation (Medical): No   Lack of Transportation (Non-Medical): No  Physical Activity: Sufficiently  Active   Days of Exercise per Week: 4 days   Minutes of Exercise per Session: 40 min  Stress: No Stress Concern Present   Feeling of Stress : Only a little  Social Connections: Socially Isolated   Frequency of Communication with Friends and Family: Twice a week   Frequency of Social Gatherings with Friends and Family: Never   Attends Religious Services: Never   Marine scientist or Organizations: No   Attends Archivist Meetings: Never   Marital Status: Widowed  Human resources officer Violence: Not At Risk   Fear of Current or Ex-Partner: No   Emotionally Abused: No   Physically Abused: No   Sexually Abused: No        Objective:    BP (!) 165/93   Pulse 79   Temp 98 F (36.7 C) (Temporal)   Ht '4\' 10"'  (1.473 m)   Wt 171 lb 3.2 oz (77.7 kg)   SpO2 93%   BMI 35.78 kg/m   Wt Readings from Last 3 Encounters:  11/21/20 171 lb 3.2 oz (77.7 kg)  10/29/20 168 lb (76.2 kg)  08/17/19 183 lb (83 kg)    Physical Exam Vitals reviewed.  Constitutional:      General: She is not in acute distress.    Appearance: Normal appearance. She is obese. She is not ill-appearing, toxic-appearing or diaphoretic.  HENT:     Head: Normocephalic and atraumatic.  Eyes:     General: No scleral icterus.       Right eye: No discharge.        Left  eye: No discharge.     Conjunctiva/sclera: Conjunctivae normal.  Cardiovascular:     Rate and Rhythm: Normal rate and regular rhythm.     Heart sounds: Normal heart sounds. No murmur heard.   No friction rub. No gallop.  Pulmonary:     Effort: Pulmonary effort is normal. No respiratory distress.     Breath sounds: Normal breath sounds. No stridor. No wheezing, rhonchi or rales.  Musculoskeletal:        General: Normal range of motion.     Cervical back: Normal range of motion.  Skin:    General: Skin is warm and dry.     Capillary Refill: Capillary refill takes less than 2 seconds.  Neurological:     General: No focal deficit present.      Mental Status: She is alert and oriented to person, place, and time. Mental status is at baseline.  Psychiatric:        Mood and Affect: Mood normal.        Behavior: Behavior normal.        Thought Content: Thought content normal.        Judgment: Judgment normal.    No results found for: TSH Lab Results  Component Value Date   WBC 9.2 07/26/2018   HGB 12.8 07/26/2018   HCT 40.4 07/26/2018   MCV 99.0 07/26/2018   PLT 296 07/26/2018   Lab Results  Component Value Date   NA 145 (H) 07/05/2019   K 4.5 07/05/2019   CO2 26 07/05/2019   GLUCOSE 74 07/05/2019   BUN 25 07/05/2019   CREATININE 1.07 (H) 07/05/2019   BILITOT <0.2 07/05/2019   ALKPHOS 130 (H) 07/05/2019   AST 14 07/05/2019   ALT 13 07/05/2019   PROT 6.9 07/05/2019   ALBUMIN 3.9 07/05/2019   CALCIUM 9.0 07/05/2019   ANIONGAP 10 07/26/2018   Lab Results  Component Value Date   CHOL 160 07/05/2019   Lab Results  Component Value Date   HDL 52 07/05/2019   Lab Results  Component Value Date   LDLCALC 94 07/05/2019   Lab Results  Component Value Date   TRIG 74 07/05/2019   Lab Results  Component Value Date   CHOLHDL 3.1 07/05/2019   Lab Results  Component Value Date   HGBA1C 6.3 07/05/2019

## 2020-11-21 NOTE — Patient Instructions (Signed)
Switch Symbicort to Trelegy.

## 2020-11-22 LAB — CMP14+EGFR
ALT: 20 IU/L (ref 0–32)
AST: 25 IU/L (ref 0–40)
Albumin/Globulin Ratio: 1.5 (ref 1.2–2.2)
Albumin: 4.1 g/dL (ref 3.8–4.8)
Alkaline Phosphatase: 114 IU/L (ref 44–121)
BUN/Creatinine Ratio: 17 (ref 12–28)
BUN: 18 mg/dL (ref 8–27)
Bilirubin Total: 0.3 mg/dL (ref 0.0–1.2)
CO2: 25 mmol/L (ref 20–29)
Calcium: 8.9 mg/dL (ref 8.7–10.3)
Chloride: 104 mmol/L (ref 96–106)
Creatinine, Ser: 1.08 mg/dL — ABNORMAL HIGH (ref 0.57–1.00)
Globulin, Total: 2.8 g/dL (ref 1.5–4.5)
Glucose: 95 mg/dL (ref 70–99)
Potassium: 3.8 mmol/L (ref 3.5–5.2)
Sodium: 144 mmol/L (ref 134–144)
Total Protein: 6.9 g/dL (ref 6.0–8.5)
eGFR: 56 mL/min/{1.73_m2} — ABNORMAL LOW (ref >59)

## 2020-11-22 LAB — LIPID PANEL
Chol/HDL Ratio: 5.9 ratio — ABNORMAL HIGH (ref 0.0–4.4)
Cholesterol, Total: 241 mg/dL — ABNORMAL HIGH (ref 100–199)
HDL: 41 mg/dL (ref >39)
LDL Chol Calc (NIH): 171 mg/dL — ABNORMAL HIGH (ref 0–99)
Triglycerides: 156 mg/dL — ABNORMAL HIGH (ref 0–149)
VLDL Cholesterol Cal: 29 mg/dL (ref 5–40)

## 2020-11-23 ENCOUNTER — Encounter: Payer: Self-pay | Admitting: Family Medicine

## 2020-11-24 ENCOUNTER — Telehealth: Payer: Self-pay

## 2020-11-24 NOTE — Telephone Encounter (Signed)
Lmtcb.

## 2020-11-24 NOTE — Telephone Encounter (Signed)
-----   Message from Loman Brooklyn, [place] sent at 11/23/2020  3:18 PM EDT ----- Please let patient know while researching through her chart I realized she is due for a thyroid ultrasound to follow-up on the nodule from 2019. I have already placed the order; I just wanted her to be aware. Also, she needs to be taking a baby aspirin 81 mg daily.

## 2020-12-25 ENCOUNTER — Ambulatory Visit (HOSPITAL_COMMUNITY): Admission: RE | Admit: 2020-12-25 | Payer: Medicare Other | Source: Ambulatory Visit

## 2021-02-03 ENCOUNTER — Other Ambulatory Visit: Payer: Self-pay | Admitting: Family Medicine

## 2021-02-03 DIAGNOSIS — J309 Allergic rhinitis, unspecified: Secondary | ICD-10-CM

## 2021-02-24 ENCOUNTER — Ambulatory Visit (INDEPENDENT_AMBULATORY_CARE_PROVIDER_SITE_OTHER): Payer: Medicare Other | Admitting: Family Medicine

## 2021-02-24 ENCOUNTER — Encounter: Payer: Self-pay | Admitting: Family Medicine

## 2021-02-24 DIAGNOSIS — J4540 Moderate persistent asthma, uncomplicated: Secondary | ICD-10-CM

## 2021-02-24 DIAGNOSIS — E782 Mixed hyperlipidemia: Secondary | ICD-10-CM

## 2021-02-24 DIAGNOSIS — F331 Major depressive disorder, recurrent, moderate: Secondary | ICD-10-CM

## 2021-02-24 DIAGNOSIS — N1831 Chronic kidney disease, stage 3a: Secondary | ICD-10-CM | POA: Diagnosis not present

## 2021-02-24 DIAGNOSIS — K219 Gastro-esophageal reflux disease without esophagitis: Secondary | ICD-10-CM | POA: Diagnosis not present

## 2021-02-24 DIAGNOSIS — R7303 Prediabetes: Secondary | ICD-10-CM | POA: Diagnosis not present

## 2021-02-24 DIAGNOSIS — M8589 Other specified disorders of bone density and structure, multiple sites: Secondary | ICD-10-CM

## 2021-02-24 DIAGNOSIS — R0681 Apnea, not elsewhere classified: Secondary | ICD-10-CM

## 2021-02-24 DIAGNOSIS — Z596 Low income: Secondary | ICD-10-CM | POA: Diagnosis not present

## 2021-02-24 DIAGNOSIS — Z1211 Encounter for screening for malignant neoplasm of colon: Secondary | ICD-10-CM

## 2021-02-24 DIAGNOSIS — I700 Atherosclerosis of aorta: Secondary | ICD-10-CM

## 2021-02-24 DIAGNOSIS — F411 Generalized anxiety disorder: Secondary | ICD-10-CM

## 2021-02-24 DIAGNOSIS — D259 Leiomyoma of uterus, unspecified: Secondary | ICD-10-CM

## 2021-02-24 DIAGNOSIS — G629 Polyneuropathy, unspecified: Secondary | ICD-10-CM | POA: Diagnosis not present

## 2021-02-24 DIAGNOSIS — I7121 Aneurysm of the ascending aorta, without rupture: Secondary | ICD-10-CM | POA: Diagnosis not present

## 2021-02-24 NOTE — Progress Notes (Signed)
Virtual Visit via Telephone Note  I connected with Joyce Ross on 02/24/21 at 2:02 PM by telephone and verified that I am speaking with the correct person using two identifiers. Joyce Ross is currently located at home and her son is currently with her during this visit. The provider, Loman Brooklyn, FNP is located in their home at time of visit.  I discussed the limitations, risks, security and privacy concerns of performing an evaluation and management service by telephone and the availability of in person appointments. I also discussed with the patient that there may be a patient responsible charge related to this service. The patient expressed understanding and agreed to proceed.  Subjective: PCP: Loman Brooklyn, FNP  Chief Complaint  Patient presents with   Medical Management of Chronic Issues   Episodes of apnea: referred to pulmonology in October 2022. The referral has since been closed out as they were unable to reach patient.   CKD: stage 3a. Patient reports she was told by her urologist that her left kidney is not functioning. She would like to schedule a follow-up with them.  GERD: controlled with Protonix.    Hyperlipidemia: Last lipid panel showed her numbers had worsened, but when she was called about lab work she stated she had not been on the Pravastatin x6 months due to not refilling.   Asthma: patient is using her Albuterol inhaler every other day. Patient was switched from Symbicort to Trelegy three months ago. She reports Albuterol is over $100 and Trelegy was going to cost her over $300.    Osteopenia: she is taking calcium and vitamin D supplements. She has been getting out and walking more for her weight bearing exercise. Her last DEXA was completed on 11/14/2019.    Osteoarthritis: patient takes Tylenol as needed.   Neuropathy: doing well with gabapentin.    Thoracic ascending aortic aneurysm: noted on chest CT on 09/28/2017. She has not had a  follow-up scan since. She missed the CT that was scheduled after our last visit earlier this year because she had a lot going on at the time. She is agreeable to repeating the CT scan, which was ordered in October 2022.    Prediabetes: patient has been cooking more at home instead of eating out so much. She is walking for exercise.   Thyroid nodule: patient had a thyroid nodule biopsy completed on 11/09/2017 which resulted as BENIGN FOLLICULAR NODULE (BETHESDA CATEGORY II). She is due for a repeat thyroid ultrasound, which was ordered in October 2022.   Depression/Anxiety: Patient reports when she takes Celexa she sleeps all day so she quit taking it. She has been taking BuSpar once per day at night before bed.  Depression screen [facility] 2/9 02/24/2021 11/21/2020 10/29/2020  Decreased Interest 0 1 0  Down, Depressed, Hopeless 1 0 0  PHQ - 2 Score 1 1 0  Altered sleeping 0 1 -  Tired, decreased energy 2 0 -  Change in appetite 1 0 -  Feeling bad or failure about yourself  2 1 -  Trouble concentrating 0 0 -  Moving slowly or fidgety/restless 0 0 -  Suicidal thoughts 0 0 -  PHQ-9 Score 6 3 -  Difficult doing work/chores Somewhat difficult Somewhat difficult -  Some recent data might be hidden   GAD 7 : Generalized Anxiety Score 02/24/2021 11/21/2020 03/13/2020 08/19/2019  Nervous, Anxious, on Edge 1 1 0 1  Control/stop worrying 1 0 0 0  Worry too much -  different things 0 1 0 1  Trouble relaxing 0 0 0 0  Restless 0 0 0 0  Easily annoyed or irritable 1 1 2  0  Afraid - awful might happen 0 1 1 0  Total GAD 7 Score 3 4 3 2   Anxiety Difficulty Not difficult at all Somewhat difficult Not difficult at all Not difficult at all    ROS: Per HPI  Current Outpatient Medications:    acetaminophen (TYLENOL) 325 MG tablet, Take 325 mg by mouth every 6 (six) hours as needed for moderate pain. , Disp: , Rfl:    albuterol (PROVENTIL HFA;VENTOLIN HFA) 108 (90 Base) MCG/ACT inhaler, Inhale 1-2 puffs into the  lungs every 6 (six) hours as needed for wheezing or shortness of breath., Disp: , Rfl:    aspirin EC 81 MG tablet, Take 81 mg by mouth daily. Swallow whole., Disp: , Rfl:    busPIRone (BUSPAR) 15 MG tablet, Take 1 tablet (15 mg total) by mouth 2 (two) times daily., Disp: 180 tablet, Rfl: 1   CALCIUM PO, Take by mouth daily., Disp: , Rfl:    cetirizine (ZYRTEC) 10 MG tablet, TAKE ONE TABLET BY MOUTH DAILY, Disp: 90 tablet, Rfl: 1   citalopram (CELEXA) 40 MG tablet, Take 1 tablet (40 mg total) by mouth daily., Disp: 90 tablet, Rfl: 1   cyclobenzaprine (FLEXERIL) 10 MG tablet, TAKE ONE TABLET BY MOUTH THREE TIMES A DAY AS NEEDED MUSCLE SPASMS, Disp: 60 tablet, Rfl: 2   fluticasone (FLONASE) 50 MCG/ACT nasal spray, Place 2 sprays into both nostrils daily., Disp: , Rfl:    Fluticasone-Umeclidin-Vilant (TRELEGY ELLIPTA) 100-62.5-25 MCG/INH AEPB, Inhale 1 puff into the lungs daily., Disp: 60 each, Rfl: 2   gabapentin (NEURONTIN) 300 MG capsule, Take 1 capsule (300 mg total) by mouth 3 (three) times daily., Disp: 270 capsule, Rfl: 1   Melatonin 1 MG TABS, Take 1 mg by mouth at bedtime as needed (sleep). , Disp: , Rfl:    ondansetron (ZOFRAN ODT) 4 MG disintegrating tablet, Take 1 tablet (4 mg total) by mouth every 8 (eight) hours as needed for nausea or vomiting., Disp: 30 tablet, Rfl: 2   pantoprazole (PROTONIX) 20 MG tablet, Take 1 tablet (20 mg total) by mouth daily., Disp: 90 tablet, Rfl: 1   pravastatin (PRAVACHOL) 80 MG tablet, Take 1 tablet (80 mg total) by mouth at bedtime., Disp: 90 tablet, Rfl: 1   VITAMIN D PO, Take by mouth daily., Disp: , Rfl:   Allergies  Allergen Reactions   Adhesive [Tape] Other (See Comments)    "leaves red places on skin"   Bee Pollen    Metoprolol     nightmares   Penicillins Other (See Comments)    Thrush Has patient had a PCN reaction causing immediate rash, facial/tongue/throat swelling, SOB or lightheadedness with hypotension: No Has patient had a PCN  reaction causing severe rash involving mucus membranes or skin necrosis: No Has patient had a PCN reaction that required hospitalization: No Has patient had a PCN reaction occurring within the last 10 years: Yes If all of the above answers are "NO", then may proceed with Cephalosporin use.    Radish [Raphanus Sativus] Hives   Yellow Jacket Venom Itching and Rash   Past Medical History:  Diagnosis Date   Coronary artery calcification seen on CAT scan 09/28/2017   Full dentures    GERD (gastroesophageal reflux disease)    Heart murmur    found a couple of years ago no problems  History of kidney stones    History of recurrent UTIs    Hyperlipidemia    Left-sided Bell's palsy 1990s   residual left facial drooping   Mild intermittent asthma    followed by pcp   Numbness and tingling in left arm    residual from cervical neck surgery going from shoulder down to hand, not completely   OA (osteoarthritis)    hands, spine, hips, right leg   Osteopenia    Peripheral neuropathy    feet   PONV (postoperative nausea and vomiting)    Pyelonephritis 09/20/2020   Thoracic ascending aortic aneurysm    CT 09-28-2017--- 4.3 cm   Thyroid nodule    ultrasound w/ bx 09/ 2019,  per pt was benign   White coat syndrome without hypertension     Observations/Objective: A&O  No respiratory distress or wheezing audible over the phone Mood, judgement, and thought processes all WNL   Assessment and Plan: 1. Episode of apnea - Ambulatory referral to Pulmonology  2. Stage 3a chronic kidney disease ([place]) Patient wants to follow-up with her kidney doctor.  3. Gastroesophageal reflux disease, unspecified whether esophagitis present Well controlled on current regimen.   4. Mixed hyperlipidemia Continue statin.  5. Aortic atherosclerosis (HCC) Continue statin and aspirin.   6. Moderate persistent asthma without complication Unable to afford Albuterol or Trelegy. - AMB Referral to [facility]  7. Patient cannot afford medications Unable to afford Albuterol or Trelegy. - AMB Referral to [facility]  8. Osteopenia of multiple sites Continue calcium and vitamin D supplements.  9. Neuropathy Well controlled on current regimen.   10. Aneurysm of ascending aorta without rupture Will get CT scheduled.  11. Pre-diabetes Controlled three months ago.  12. Major depressive disorder, recurrent, moderate (HCC) Uncontrolled. D/C Celexa. Start Prozac. - FLUoxetine (PROZAC) 20 MG capsule; Take 1 capsule (20 mg total) by mouth daily.  Dispense: 30 capsule; Refill: 2  13. Generalized anxiety disorder Uncontrolled. D/C Celexa. Start Prozac. - FLUoxetine (PROZAC) 20 MG capsule; Take 1 capsule (20 mg total) by mouth daily.  Dispense: 30 capsule; Refill: 2  14. Colon cancer screening - Cologuard   Follow Up Instructions: Return in about 6 weeks (around 04/07/2021) for Depression.  I discussed the assessment and treatment plan with the patient. The patient was provided an opportunity to ask questions and all were answered. The patient agreed with the plan and demonstrated an understanding of the instructions.   The patient was advised to call back or seek an in-person evaluation if the symptoms worsen or if the condition fails to improve as anticipated.  The above assessment and management plan was discussed with the patient. The patient verbalized understanding of and has agreed to the management plan. Patient is aware to call the clinic if symptoms persist or worsen. Patient is aware when to return to the clinic for a follow-up visit. Patient educated on when it is appropriate to go to the emergency department.   Time call ended: 2:36 PM  I provided 34 minutes of non-face-to-face time during this encounter.  Hendricks Limes, MSN, APRN, FNP-C [facility] 02/24/21

## 2021-03-02 ENCOUNTER — Encounter: Payer: Self-pay | Admitting: Family Medicine

## 2021-03-02 MED ORDER — FLUOXETINE HCL 20 MG PO CAPS: 20.0000 mg | ORAL_CAPSULE | Freq: Every day | ORAL | 2 refills | Status: DC

## 2021-03-04 ENCOUNTER — Telehealth: Payer: Self-pay

## 2021-03-04 NOTE — Chronic Care Management (AMB) (Signed)
°  Chronic Care Management   Note  03/04/2021 Name: Joyce Ross MRN: [id number] DOB: 10-10-1950  Joyce Ross is a 71 y.o. year old female who is a primary care patient of Loman Brooklyn, FNP. Joyce Ross is currently enrolled in care management services. An additional referral for Pharm D  was placed.   Follow up plan: Unsuccessful telephone outreach attempt made. A HIPAA compliant phone message was left for the patient providing contact information and requesting a return call.  The care management team will reach out to the patient again over the next 7 days.  If patient returns call to provider office, please advise to call [facility]  at [address], [place], [place], [place] [address] Direct Dial: [phone] [email or web address]: [email or web address]

## 2021-03-10 NOTE — Chronic Care Management (AMB) (Signed)
°  Chronic Care Management   Note  03/10/2021 Name: Joyce Ross MRN: [id number] DOB: Jun 26, 1950  Joyce Ross is a 71 y.o. year old female who is a primary care patient of Loman Brooklyn, FNP. Joyce Ross is currently enrolled in care management services. An additional referral for Pharm D  was placed.   Follow up plan: Telephone appointment with care management team member scheduled for:03/11/2021  N[address] Lar[address], [address], [place], [place] [address] Direct Dial: [phone] [email or web address]: [email or web address]

## 2021-03-11 ENCOUNTER — Ambulatory Visit (INDEPENDENT_AMBULATORY_CARE_PROVIDER_SITE_OTHER): Payer: Medicare Other | Admitting: Pharmacist

## 2021-03-11 DIAGNOSIS — J4540 Moderate persistent asthma, uncomplicated: Secondary | ICD-10-CM

## 2021-03-11 DIAGNOSIS — E782 Mixed hyperlipidemia: Secondary | ICD-10-CM

## 2021-03-11 MED ORDER — BREZTRI AEROSPHERE 160-9-4.8 MCG/ACT IN AERO: 2.0000 | INHALATION_SPRAY | Freq: Two times a day (BID) | RESPIRATORY_TRACT | 11 refills | Status: DC

## 2021-03-11 NOTE — Progress Notes (Signed)
Chronic Care Management Pharmacy Note  03/11/2021 Name:  Joyce Ross MRN:  [id number] DOB:  1950-09-23  Summary: ASTHMA/COPD, HLD  Recommendations/Changes made from today's visit:  Asthma/likely Chronic Obstructive Pulmonary Disease:  New goal. Uncontrolled; current treatment: trelegy (patient unable to afford)-->will transition to [facility]; has albuterol rescue inhaler (HAVING TO USE 4-5 TIMES DAILY WHILE ON SYMBICORT-->SWITCHING TO TRIPLE THERAPY) Patient likely with COPD (never diagnosed, but 44 pack yr. History Pulm consult was put in however patient didn't f/u to schedule New consult put in by PCP  GOLD Classification:  n/A MMRC/CAT score: n/a Most recent Pulmonary Function Testing:  seeing pulm soon 1 exacerbations requiring treatment in the last 6 months  Recommended would start breztri (easy with patient assistance; unable to obtain trelegy); samples of breztri given Assessed patient finances. Will enroll in az&me patient assistance program Patient stopped smoking in 2014 (44 pk yrs)  Hyperlipidemia Reviewed cholesterol--we will need to switch to rosuvastatin at f/u; pravastatin is not strong enough to get her to goal; will likely have to add additional agent Lipid Panel     Component Value Date/Time   CHOL 241 (H) 11/21/2020 1402   TRIG 156 (H) 11/21/2020 1402   HDL 41 11/21/2020 1402   CHOLHDL 5.9 (H) 11/21/2020 1402   LDLCALC 171 (H) 11/21/2020 1402   LABVLDL 29 11/21/2020 1402   Patient Goals/Self-Care Activities patient will:  - take medications as prescribed as evidenced by patient report and record review collaborate with provider on medication access solutions  Plan: 1 MONTHS  Subjective: Joyce Ross is an 71 y.o. year old female who is a primary patient of Joyce Ross.  The CCM team was consulted for assistance with disease management and care coordination needs.    Engaged with patient by telephone for initial visit in response to  provider referral for pharmacy case management and/or care coordination services.   Consent to Services:  The patient was given information about Chronic Care Management services, agreed to services, and gave verbal consent prior to initiation of services.  Please see initial visit note for detailed documentation.   Patient Care Team: Joyce Ross as PCP - General (Family Medicine) Joyce China, RN as Registered Nurse Joyce Ross, Joyce Quarry, MD as Referring Physician (Optometry) Joyce Mallow, MD as Consulting Physician (Urology) Joyce Ross, Joyce Ross as Pharmacist (Family Medicine)  Objective:  Lab Results  Component Value Date   CREATININE 1.08 (H) 11/21/2020   CREATININE 1.07 (H) 07/05/2019   CREATININE 1.32 (H) 07/26/2018    Lab Results  Component Value Date   HGBA1C 5.7 (H) 11/21/2020   Last diabetic Eye exam: No results found for: HMDIABEYEEXA  Last diabetic Foot exam: No results found for: HMDIABFOOTEX      Component Value Date/Time   CHOL 241 (H) 11/21/2020 1402   TRIG 156 (H) 11/21/2020 1402   HDL 41 11/21/2020 1402   CHOLHDL 5.9 (H) 11/21/2020 1402   [place] 171 (H) 11/21/2020 1402    Hepatic Function Latest Ref Rng & Units 11/21/2020 07/05/2019 12/02/2016  Total Protein 6.0 - 8.5 g/dL 6.9 6.9 7.1  Albumin 3.8 - 4.8 g/dL 4.1 3.9 4.4  AST 0 - 40 IU/L _0 ALT 0 - 32 IU/L _1 Alk Phosphatase 44 - 121 IU/L 114 130(H) 111  Total Bilirubin 0.0 - 1.2 mg/dL 0.3 <0.2 0.3    No results found for: TSH, FREET4  CBC Latest  Ref Rng & Units 07/26/2018 01/05/2018 09/21/2007  WBC 4.0 - 10.5 K/uL 9.2 14.4(H) 8.3  Hemoglobin 12.0 - 15.0 g/dL 12.8 13.2 13.1  Hematocrit 36.0 - 46.0 % 40.4 43.0 38.7  Platelets 150 - 400 K/uL 296 346 297    No results found for: VD25OH  Clinical ASCVD: No  The 10-year ASCVD risk score (Arnett DK, et al., 2019) is: 16.8%   Values used to calculate the score:     Age: 63 years     Sex: Female     Is Non-Hispanic African  American: No     Diabetic: No     Tobacco smoker: No     Systolic Blood Pressure: 518 mmHg     Is BP treated: No     HDL Cholesterol: 41 mg/dL     Total Cholesterol: 241 mg/dL    Other: (CHADS2VASc if Afib, PHQ9 if depression, MMRC or CAT for COPD, ACT, DEXA)  Social History   Tobacco Use  Smoking Status Former   Years: 44.00   Types: Cigarettes   Quit date: 10/09/2012   Years since quitting: 8.4  Smokeless Tobacco Never  Tobacco Comments   since age 6   BP Readings from Last 3 Encounters:  11/21/20 (!) 165/93  08/17/19 129/78  07/05/19 (!) 121/51   Pulse Readings from Last 3 Encounters:  11/21/20 79  08/17/19 90  07/05/19 63   Wt Readings from Last 3 Encounters:  11/21/20 171 lb 3.2 oz (77.7 kg)  10/29/20 168 lb (76.2 kg)  08/17/19 183 lb (83 kg)    Assessment: Review of patient past medical history, allergies, medications, health status, including review of consultants reports, laboratory and other test data, was performed as part of comprehensive evaluation and provision of chronic care management services.   SDOH:  (Social Determinants of Health) assessments and interventions performed:    CCM Care Plan  Allergies  Allergen Reactions   Adhesive [Tape] Other (See Comments)    "leaves red places on skin"   Bee Pollen    Metoprolol     nightmares   Penicillins Other (See Comments)    Thrush Has patient had a PCN reaction causing immediate rash, facial/tongue/throat swelling, SOB or lightheadedness with hypotension: No Has patient had a PCN reaction causing severe rash involving mucus membranes or skin necrosis: No Has patient had a PCN reaction that required hospitalization: No Has patient had a PCN reaction occurring within the last 10 years: Yes If all of the above answers are "NO", then may proceed with Cephalosporin use.    Radish [Raphanus Sativus] Hives   Yellow Jacket Venom Itching and Rash    Medications Reviewed Today     Reviewed by Joyce Ross, Joyce Ross (Pharmacist) on 03/23/21 at 2151  Med List Status: <None>   Medication Order Taking? Sig Documenting Provider Last Dose Status Informant  acetaminophen (TYLENOL) 325 MG tablet [id number] No Take 325 mg by mouth every 6 (six) hours as needed for moderate pain.  [provider] Taking Active Self  albuterol (PROVENTIL HFA;VENTOLIN HFA) 108 (90 Base) MCG/ACT inhaler [id number] No Inhale 1-2 puffs into the lungs every 6 (six) hours as needed for wheezing or shortness of breath. [provider] Taking Active Self  aspirin EC 81 MG tablet [id number]  Take 81 mg by mouth daily. Swallow whole. [provider]  Active   Budeson-Glycopyrrol-Formoterol (BREZTRI AEROSPHERE) 160-9-4.8 MCG/ACT Hollie Salk [id number]  Inhale 2 puffs into the lungs 2 (two) times daily. Joyce Brooklyn,  Ross  Active            Med Note Shara Blazing Mar 23, 2021  9:51 PM) PATIENT ASSISTANCE VIA AZ&ME  busPIRone (BUSPAR) 15 MG tablet [id number]  Take 1 tablet (15 mg total) by mouth 2 (two) times daily. Joyce Ross  Active   CALCIUM PO [id number] No Take by mouth daily. [provider] Taking Active   cetirizine (ZYRTEC) 10 MG tablet [id number]  TAKE ONE TABLET BY MOUTH DAILY Joyce Ross  Active   cyclobenzaprine (FLEXERIL) 10 MG tablet [id number]  TAKE ONE TABLET BY MOUTH THREE TIMES A DAY AS NEEDED MUSCLE SPASMS Hendricks Limes F, Ross  Active   FLUoxetine (PROZAC) 20 MG capsule [id number]  Take 1 capsule (20 mg total) by mouth daily. Joyce Ross  Active   fluticasone [facility]) 50 MCG/ACT nasal spray [id number] No Place 2 sprays into both nostrils daily. [provider] Taking Active   gabapentin (NEURONTIN) 300 MG capsule [id number]  Take 1 capsule (300 mg total) by mouth 3 (three) times daily. Joyce Ross  Active   Melatonin 1 MG TABS [id number] No Take 1 mg by mouth at bedtime as needed (sleep).  [provider] Taking Active Self   ondansetron (ZOFRAN ODT) 4 MG disintegrating tablet [id number] No Take 1 tablet (4 mg total) by mouth every 8 (eight) hours as needed for nausea or vomiting. Joyce Ross Taking Active   pantoprazole (PROTONIX) 20 MG tablet [id number]  Take 1 tablet (20 mg total) by mouth daily. Joyce Ross  Active   pravastatin (PRAVACHOL) 80 MG tablet [id number]  Take 1 tablet (80 mg total) by mouth at bedtime. Joyce Ross  Active   VITAMIN D PO [id number] No Take by mouth daily. [provider] Taking Active             Patient Active Problem List   Diagnosis Date Noted   Moderate persistent asthma without complication 80/22/3361   Osteopenia    Generalized anxiety disorder 08/19/2019   Chronic shoulder pain 03/26/2019   OA (osteoarthritis)    Thyroid nodule 10/07/2017   Hydronephrosis 10/07/2017   Thoracic aortic aneurysm 10/07/2017   Neuropathy 10/15/2015   Major depressive disorder, recurrent, moderate ([place]) 10/15/2015   Esophageal reflux 10/15/2015   Notalgia 10/15/2015   Hyperlipidemia 10/15/2015   Pre-diabetes 10/15/2015    Immunization History  Administered Date(s) Administered   Fluad Quad(high Dose 65+) 03/14/2020, 11/21/2020   PFIZER(Purple Top)SARS-COV-2 Vaccination 05/31/2019, 06/22/2019   PNEUMOCOCCAL CONJUGATE-20 11/21/2020   Pneumococcal Conjugate-13 10/25/2017   Tdap 02/05/2013    Conditions to be addressed/monitored: COPD and Asthma  Care Plan : PHARMD MEDICATION MANAGEMENT  Updates made by Joyce Ross, Maynard since 03/23/2021 12:00 AM     Problem: DISEASE PROGRESSION PREVENTION      Long-Range Goal: ASTHMA/COPD OVERLAP   This Visit's Progress: Not on track  Note:   Current Barriers:  Unable to independently afford treatment regimen Unable to achieve control of ASTHMA/COPD   Pharmacist Clinical Goal(s):  patient will verbalize ability to afford treatment regimen achieve control of COPD/ASTHMA as evidenced by [place]  through collaboration with PharmD and provider.   Interventions: 1:1 collaboration with Joyce Ross regarding development and update of comprehensive plan of care as evidenced by provider attestation and co-signature Inter-disciplinary care team collaboration (see longitudinal plan of care) Comprehensive medication review  performed; medication list updated in electronic medical record  Asthma/likely Chronic Obstructive Pulmonary Disease:  New goal. Uncontrolled; current treatment: trelegy (patient unable to afford)-->will transition to [facility]; has albuterol rescue inhaler Patient likely with COPD (never diagnosed, but 44 pack yr. History Pulm consult was put in however patient didn't f/u to schedule New consult put in by PCP  GOLD Classification:  n/A MMRC/CAT score: n/a Most recent Pulmonary Function Testing:  seeing pulm soon 1 exacerbations requiring treatment in the last 6 months  Recommended would start breztri (easy with patient assistance; unable to obtain trelegy); samples of breztri given Assessed patient finances. Will enroll in az&me patient assistance program Patient stopped smoking in 2014 (44 pk yrs) Reviewed cholesterol Lipid Panel     Component Value Date/Time   CHOL 241 (H) 11/21/2020 1402   TRIG 156 (H) 11/21/2020 1402   HDL 41 11/21/2020 1402   CHOLHDL 5.9 (H) 11/21/2020 1402   LDLCALC 171 (H) 11/21/2020 1402   LABVLDL 29 11/21/2020 1402      Patient Goals/Self-Care Activities patient will:  - take medications as prescribed as evidenced by patient report and record review collaborate with provider on medication access solutions      Medication Assistance: Application for AZ&ME BREZTRI  medication assistance program. in process.  Anticipated assistance start date TBD.  See plan of care for additional detail.  Patient's preferred pharmacy is:  [id number] - [place], [place] [address] Phone: [phone] Fax: [phone]  [facility] - [address], [address] [place] [address] Phone: [phone] Fax: [phone]   Follow Up:  Patient agrees to Care Plan and Follow-up.  Plan: Telephone follow up appointment with care management team member scheduled for:  1 MONTH    Regina Eck, PharmD, BCPS Clinical Pharmacist, [place]  II Phone [phone]

## 2021-03-17 DIAGNOSIS — E782 Mixed hyperlipidemia: Secondary | ICD-10-CM

## 2021-03-17 DIAGNOSIS — J4540 Moderate persistent asthma, uncomplicated: Secondary | ICD-10-CM

## 2021-03-23 ENCOUNTER — Ambulatory Visit
Admission: RE | Admit: 2021-03-23 | Discharge: 2021-03-23 | Disposition: A | Discharge status: Home / Self Care | Payer: Medicare Other | Source: Ambulatory Visit | Attending: Family Medicine | Admitting: Family Medicine

## 2021-03-23 DIAGNOSIS — Z1231 Encounter for screening mammogram for malignant neoplasm of breast: Secondary | ICD-10-CM | POA: Diagnosis not present

## 2021-03-23 MED ORDER — BREZTRI AEROSPHERE 160-9-4.8 MCG/ACT IN AERO: 2.0000 | INHALATION_SPRAY | Freq: Two times a day (BID) | RESPIRATORY_TRACT | 11 refills | Status: DC

## 2021-03-23 NOTE — Patient Instructions (Signed)
Visit Information  Following are the goals we discussed today:  Current Barriers:  Unable to independently afford treatment regimen Unable to achieve control of ASTHMA/COPD   Pharmacist Clinical Goal(s):  patient will verbalize ability to afford treatment regimen achieve control of COPD/ASTHMA as evidenced by [place] through collaboration with PharmD and provider.   Interventions: 1:1 collaboration with Loman Brooklyn, FNP regarding development and update of comprehensive plan of care as evidenced by provider attestation and co-signature Inter-disciplinary care team collaboration (see longitudinal plan of care) Comprehensive medication review performed; medication list updated in electronic medical record  Asthma/likely Chronic Obstructive Pulmonary Disease:  New goal. Uncontrolled; current treatment: trelegy (patient unable to afford)-->will transition to [facility]; has albuterol rescue inhaler (HAVING TO USE 4-5 TIMES DAILY WHILE ON SYMBICORT-->SWITCHING TO TRIPLE THERAPY) Patient likely with COPD (never diagnosed, but 44 pack yr. History Pulm consult was put in however patient didn't f/u to schedule New consult put in by PCP  GOLD Classification:  n/A MMRC/CAT score: n/a Most recent Pulmonary Function Testing:  seeing pulm soon 1 exacerbations requiring treatment in the last 6 months  Recommended would start breztri (easy with patient assistance; unable to obtain trelegy); samples of breztri given Assessed patient finances. Will enroll in [place] patient assistance program Patient stopped smoking in 2014 (44 pk yrs)  Hyperlipidemia Reviewed cholesterol--we will need to switch to rosuvastatin at f/u; pravastatin is not strong enough to get her to goal; will likely have to add additional agent Lipid Panel     Component Value Date/Time   CHOL 241 (H) 11/21/2020 1402   TRIG 156 (H) 11/21/2020 1402   HDL 41 11/21/2020 1402   CHOLHDL 5.9 (H)  11/21/2020 1402   LDLCALC 171 (H) 11/21/2020 1402   LABVLDL 29 11/21/2020 1402    Patient Goals/Self-Care Activities patient will:  - take medications as prescribed as evidenced by patient report and record review collaborate with provider on medication access solutions   Plan: Telephone follow up appointment with care management team member scheduled for:  1 MONTH  Signature Regina Eck, PharmD, BCPS Clinical Pharmacist, [place]  II Phone [phone]   Please call the care guide team at [phone] if you need to cancel or reschedule your appointment.   Patient verbalizes understanding of instructions and care plan provided today and agrees to view in [place]. Active MyChart status confirmed with patient.

## 2021-04-07 ENCOUNTER — Ambulatory Visit (INDEPENDENT_AMBULATORY_CARE_PROVIDER_SITE_OTHER): Payer: Medicare Other | Admitting: Family Medicine

## 2021-04-07 ENCOUNTER — Encounter: Payer: Self-pay | Admitting: Family Medicine

## 2021-04-07 VITALS — BP 146/74 | HR 98 | Temp 98.0°F | Ht <= 58 in | Wt 168.4 lb

## 2021-04-07 DIAGNOSIS — I7121 Aneurysm of the ascending aorta, without rupture: Secondary | ICD-10-CM

## 2021-04-07 DIAGNOSIS — E041 Nontoxic single thyroid nodule: Secondary | ICD-10-CM

## 2021-04-07 DIAGNOSIS — F411 Generalized anxiety disorder: Secondary | ICD-10-CM

## 2021-04-07 DIAGNOSIS — F331 Major depressive disorder, recurrent, moderate: Secondary | ICD-10-CM

## 2021-04-07 DIAGNOSIS — Z23 Encounter for immunization: Secondary | ICD-10-CM

## 2021-04-07 NOTE — Patient Instructions (Signed)
Please complete your Cologuard ASAP.

## 2021-04-07 NOTE — Progress Notes (Signed)
Assessment & Plan:  1-2. Major depressive disorder, recurrent, moderate (HCC)/Generalized anxiety disorder Uncontrolled. Patient to start Prozac as previously ordered.  3. Aneurysm of ascending aorta without rupture - CT ANGIO CHEST AORTA W/ & OR WO/CM & GATING ([place] ONLY); Future  4. Thyroid nodule - US THYROID; Future  5. Immunization due Shingrix given in office today.    Return in about 6 weeks (around 05/19/2021) for anxiety/depression.  Hendricks Limes, MSN, APRN, FNP-C [facility]  Subjective:    [name], female    DOB: 1950-11-29, 71 y.o.   MRN: [id number]  Patient Care Team: Loman Brooklyn, FNP as PCP - General (Family Medicine) Ilean China, RN as Registered Nurse Marin Comment, Allison Quarry, MD as Referring Physician (Optometry) Lucas Mallow, MD as Consulting Physician (Urology) Lavera Guise, [facility] as Pharmacist (Family Medicine)   Chief Complaint:  Chief Complaint  Patient presents with   Depression    6 week follow up     HPI: Joyce Ross is a 71 y.o. female presenting on 04/07/2021 for Depression (6 week follow up )  Depression/Anxiety: Patient reports she picked up the Prozac last week, but has not started it yet. She is afraid to start it because her son took it and it caused him to have constipation.   Patient does check her blood pressure at home and reports readings 120-100/60s. She states she gets high here because the cuff hurts her arm so much. She also has some family business going on that she feels is likely increasing her blood pressure today.  New complaints: Patient reports she never heard anything regarding her previous orders for a CT Chest and US Thyroid.    Social history:  Relevant past medical, surgical, family and social history reviewed and updated as indicated. Interim medical history since our last visit reviewed.  Allergies and medications reviewed and updated.  DATA  REVIEWED: CHART IN EPIC  ROS: Negative unless specifically indicated above in HPI.    Current Outpatient Medications:    acetaminophen (TYLENOL) 325 MG tablet, Take 325 mg by mouth every 6 (six) hours as needed for moderate pain. , Disp: , Rfl:    albuterol (PROVENTIL HFA;VENTOLIN HFA) 108 (90 Base) MCG/ACT inhaler, Inhale 1-2 puffs into the lungs every 6 (six) hours as needed for wheezing or shortness of breath., Disp: , Rfl:    aspirin EC 81 MG tablet, Take 81 mg by mouth daily. Swallow whole., Disp: , Rfl:    Budeson-Glycopyrrol-Formoterol (BREZTRI AEROSPHERE) 160-9-4.8 MCG/ACT AERO, Inhale 2 puffs into the lungs 2 (two) times daily., Disp: 32.1 g, Rfl: 11   busPIRone (BUSPAR) 15 MG tablet, Take 1 tablet (15 mg total) by mouth 2 (two) times daily., Disp: 180 tablet, Rfl: 1   CALCIUM PO, Take by mouth daily., Disp: , Rfl:    cetirizine (ZYRTEC) 10 MG tablet, TAKE ONE TABLET BY MOUTH DAILY, Disp: 90 tablet, Rfl: 1   cyclobenzaprine (FLEXERIL) 10 MG tablet, TAKE ONE TABLET BY MOUTH THREE TIMES A DAY AS NEEDED MUSCLE SPASMS, Disp: 60 tablet, Rfl: 2   fluticasone (FLONASE) 50 MCG/ACT nasal spray, Place 2 sprays into both nostrils daily., Disp: , Rfl:    gabapentin (NEURONTIN) 300 MG capsule, Take 1 capsule (300 mg total) by mouth 3 (three) times daily., Disp: 270 capsule, Rfl: 1   Melatonin 1 MG TABS, Take 1 mg by mouth at bedtime as needed (sleep). , Disp: , Rfl:  ondansetron (ZOFRAN ODT) 4 MG disintegrating tablet, Take 1 tablet (4 mg total) by mouth every 8 (eight) hours as needed for nausea or vomiting., Disp: 30 tablet, Rfl: 2   pantoprazole (PROTONIX) 20 MG tablet, Take 1 tablet (20 mg total) by mouth daily., Disp: 90 tablet, Rfl: 1   pravastatin (PRAVACHOL) 80 MG tablet, Take 1 tablet (80 mg total) by mouth at bedtime., Disp: 90 tablet, Rfl: 1   VITAMIN D PO, Take by mouth daily., Disp: , Rfl:    FLUoxetine (PROZAC) 20 MG capsule, Take 1 capsule (20 mg total) by mouth daily. (Patient not  taking: Reported on 04/07/2021), Disp: 30 capsule, Rfl: 2   Allergies  Allergen Reactions   Adhesive [Tape] Other (See Comments)    "leaves red places on skin"   Bee Pollen    Metoprolol     nightmares   Penicillins Other (See Comments)    Thrush Has patient had a PCN reaction causing immediate rash, facial/tongue/throat swelling, SOB or lightheadedness with hypotension: No Has patient had a PCN reaction causing severe rash involving mucus membranes or skin necrosis: No Has patient had a PCN reaction that required hospitalization: No Has patient had a PCN reaction occurring within the last 10 years: Yes If all of the above answers are "NO", then may proceed with Cephalosporin use.    Radish [Raphanus Sativus] Hives   Yellow Jacket Venom Itching and Rash   Past Medical History:  Diagnosis Date   Coronary artery calcification seen on CAT scan 09/28/2017   Full dentures    GERD (gastroesophageal reflux disease)    Heart murmur    found a couple of years ago no problems   History of kidney stones    History of recurrent UTIs    Hyperlipidemia    Left-sided Bell's palsy 1990s   residual left facial drooping   Mild intermittent asthma    followed by pcp   Numbness and tingling in left arm    residual from cervical neck surgery going from shoulder down to hand, not completely   OA (osteoarthritis)    hands, spine, hips, right leg   Osteopenia    Peripheral neuropathy    feet   PONV (postoperative nausea and vomiting)    Pyelonephritis 09/20/2020   Thoracic ascending aortic aneurysm    CT 09-28-2017--- 4.3 cm   Thyroid nodule    ultrasound w/ bx 09/ 2019,  per pt was benign   White coat syndrome without hypertension     Past Surgical History:  Procedure Laterality Date   ANTERIOR CERVICAL DECOMP/DISCECTOMY FUSION  1980s   CARPAL TUNNEL RELEASE Bilateral 2000   CESAREAN SECTION  x2 last one [place], URETEROSCOPY AND STENT PLACEMENT Left  01/11/2018   Procedure: CYSTOSCOPY, URETEROSCOPY AND STENT PLACEMENT;  Surgeon: Lucas Mallow, MD;  Location: [facility];  Service: Urology;  Laterality: Left;   CYSTOSCOPY/URETEROSCOPY/HOLMIUM LASER/STENT PLACEMENT Left 07/31/2018   Procedure: CYSTOSCOPY/URETEROSCOPY/HOLMIUM LASER/STENT PLACEMENT;  Surgeon: Lucas Mallow, MD;  Location: [facility];  Service: Urology;  Laterality: Left;   EXTRACORPOREAL SHOCK WAVE LITHOTRIPSY Left 12/05/2017   Procedure: LEFT EXTRACORPOREAL SHOCK WAVE LITHOTRIPSY (ESWL);  Surgeon: Franchot Gallo, MD;  Location: [facility];  Service: Urology;  Laterality: Left;   EXTRACORPOREAL SHOCK WAVE LITHOTRIPSY  1990s   HOLMIUM LASER APPLICATION Left 07/86/7544   Procedure: HOLMIUM LASER APPLICATION;  Surgeon: Lucas Mallow, MD;  Location: [facility];  Service: Urology;  Laterality: Left;  MOUTH SURGERY     all teeth pulled    Social History   Socioeconomic History   Marital status: Widowed    Spouse name: Not on file   Number of children: 2   Years of education: 12th Grade   Highest education level: High school graduate  Occupational History   Occupation: retired  Tobacco Use   Smoking status: Former    Years: 44.00    Types: Cigarettes    Quit date: 10/09/2012    Years since quitting: 8.4   Smokeless tobacco: Never   Tobacco comments:    since age 71  Vaping Use   Vaping Use: Never used  Substance and Sexual Activity   Alcohol use: No   Drug use: No    Comment: per pt last used "pot" 1970s   Sexual activity: Not Currently    Birth control/protection: Post-menopausal  Other Topics Concern   Not on file  Social History Narrative   Son lives with her   Social Determinants of Health   Financial Resource Strain: Low Risk    Difficulty of Paying Living Expenses: Not hard at all  Food Insecurity: No Food Insecurity   Worried About Charity fundraiser in the Last Year: Never true   Arboriculturist in the Last Year: Never true  Transportation Needs:  No Transportation Needs   Lack of Transportation (Medical): No   Lack of Transportation (Non-Medical): No  Physical Activity: Sufficiently Active   Days of Exercise per Week: 4 days   Minutes of Exercise per Session: 40 min  Stress: No Stress Concern Present   Feeling of Stress : Only a little  Social Connections: Socially Isolated   Frequency of Communication with Friends and Family: Twice a week   Frequency of Social Gatherings with Friends and Family: Never   Attends Religious Services: Never   Marine scientist or Organizations: No   Attends Archivist Meetings: Never   Marital Status: Widowed  Human resources officer Violence: Not At Risk   Fear of Current or Ex-Partner: No   Emotionally Abused: No   Physically Abused: No   Sexually Abused: No        Objective:    BP (!) 146/74    Pulse 98    Temp 98 F (36.7 C) (Temporal)    Ht _0  (1.473 m)    Wt 168 lb 6.4 oz (76.4 kg)    SpO2 94%    BMI 35.20 kg/m   Wt Readings from Last 3 Encounters:  04/07/21 168 lb 6.4 oz (76.4 kg)  11/21/20 171 lb 3.2 oz (77.7 kg)  10/29/20 168 lb (76.2 kg)    Physical Exam Vitals reviewed.  Constitutional:      General: She is not in acute distress.    Appearance: Normal appearance. She is obese. She is not ill-appearing, toxic-appearing or diaphoretic.  HENT:     Head: Normocephalic and atraumatic.  Eyes:     General: No scleral icterus.       Right eye: No discharge.        Left eye: No discharge.     Conjunctiva/sclera: Conjunctivae normal.  Cardiovascular:     Rate and Rhythm: Normal rate and regular rhythm.     Heart sounds: Normal heart sounds. No murmur heard.   No friction rub. No gallop.  Pulmonary:     Effort: Pulmonary effort is normal. No respiratory distress.     Breath sounds: Normal breath sounds.  No stridor. No wheezing, rhonchi or rales.  Musculoskeletal:        General: Normal range of motion.     Cervical back: Normal range of motion.  Skin:     General: Skin is warm and dry.     Capillary Refill: Capillary refill takes less than 2 seconds.  Neurological:     General: No focal deficit present.     Mental Status: She is alert and oriented to person, place, and time. Mental status is at baseline.  Psychiatric:        Mood and Affect: Mood normal.        Behavior: Behavior normal.        Thought Content: Thought content normal.        Judgment: Judgment normal.    No results found for: TSH Lab Results  Component Value Date   WBC 9.2 07/26/2018   HGB 12.8 07/26/2018   HCT 40.4 07/26/2018   MCV 99.0 07/26/2018   PLT 296 07/26/2018   Lab Results  Component Value Date   NA 144 11/21/2020   K 3.8 11/21/2020   CO2 25 11/21/2020   GLUCOSE 95 11/21/2020   BUN 18 11/21/2020   CREATININE 1.08 (H) 11/21/2020   BILITOT 0.3 11/21/2020   ALKPHOS 114 11/21/2020   AST 25 11/21/2020   ALT 20 11/21/2020   PROT 6.9 11/21/2020   ALBUMIN 4.1 11/21/2020   CALCIUM 8.9 11/21/2020   ANIONGAP 10 07/26/2018   EGFR 56 (L) 11/21/2020   Lab Results  Component Value Date   CHOL 241 (H) 11/21/2020   Lab Results  Component Value Date   HDL 41 11/21/2020   Lab Results  Component Value Date   LDLCALC 171 (H) 11/21/2020   Lab Results  Component Value Date   TRIG 156 (H) 11/21/2020   Lab Results  Component Value Date   CHOLHDL 5.9 (H) 11/21/2020   Lab Results  Component Value Date   HGBA1C 5.7 (H) 11/21/2020

## 2021-04-14 ENCOUNTER — Ambulatory Visit (INDEPENDENT_AMBULATORY_CARE_PROVIDER_SITE_OTHER): Payer: Medicare Other | Admitting: Pharmacist

## 2021-04-14 DIAGNOSIS — J4540 Moderate persistent asthma, uncomplicated: Secondary | ICD-10-CM | POA: Diagnosis not present

## 2021-04-14 DIAGNOSIS — E782 Mixed hyperlipidemia: Secondary | ICD-10-CM

## 2021-04-14 NOTE — Progress Notes (Signed)
Chronic Care Management Pharmacy Note  04/14/2021 Name:  Joyce Ross MRN:  [id number] DOB:  01-Jan-1951  Summary: asthma/copd  Recommendations/Changes made from today's visit:  Asthma/likely Chronic Obstructive Pulmonary Disease:  Goal on Track (progressing): YES. Uncontrolled; current treatment: trelegy (patient unable to afford)-->will transition to [facility]; has albuterol rescue inhaler Patient likely with COPD (never diagnosed, but 44 pack yr. History) Pulm consult was put in however patient didn't f/u to schedule New consult put in by PCP  GOLD Classification:  n/A MMRC/CAT score: n/a Most recent Pulmonary Function Testing:  seeing pulm soon 1 exacerbations requiring treatment in the last 6 months  Recommended would start breztri (easy with patient assistance; unable to obtain trelegy); samples of breztri given today Assessed patient finances. Application sent for az&me patient assistance program Patient stopped smoking in 2014 (44 pk yrs)  Hyperlipidemia Reviewed cholesterol--we will need to switch to rosuvastatin at f/u; pravastatin is likely not strong enough to get her to goal; will likely have to add additional agent Patient admits to just starting pravastatin due to cost (she is now enrolled in the [facility])--will repeat lipids in 3-6 months  Subjective: Joyce Ross is an 71 y.o. year old female who is a primary patient of Loman Brooklyn, FNP.  The CCM team was consulted for assistance with disease management and care coordination needs.    Engaged with patient face to face for follow up visit in response to provider referral for pharmacy case management and/or care coordination services.   Consent to Services:  The patient was given information about Chronic Care Management services, agreed to services, and gave verbal consent prior to initiation of services.  Please see initial visit note for detailed documentation.   Patient Care  Team: Loman Brooklyn, FNP as PCP - General (Family Medicine) Ilean China, RN as Registered Nurse Marin Comment, Allison Quarry, MD as Referring Physician (Optometry) Lucas Mallow, MD as Consulting Physician (Urology) Lavera Guise, [facility] as Pharmacist (Family Medicine)  Objective:  Lab Results  Component Value Date   CREATININE 1.08 (H) 11/21/2020   CREATININE 1.07 (H) 07/05/2019   CREATININE 1.32 (H) 07/26/2018    Lab Results  Component Value Date   HGBA1C 5.7 (H) 11/21/2020   Last diabetic Eye exam: No results found for: HMDIABEYEEXA  Last diabetic Foot exam: No results found for: HMDIABFOOTEX      Component Value Date/Time   CHOL 241 (H) 11/21/2020 1402   TRIG 156 (H) 11/21/2020 1402   HDL 41 11/21/2020 1402   CHOLHDL 5.9 (H) 11/21/2020 1402   [place] 171 (H) 11/21/2020 1402    Hepatic Function Latest Ref Rng & Units 11/21/2020 07/05/2019 12/02/2016  Total Protein 6.0 - 8.5 g/dL 6.9 6.9 7.1  Albumin 3.8 - 4.8 g/dL 4.1 3.9 4.4  AST 0 - 40 IU/L '25 14 17  ' ALT 0 - 32 IU/L '20 13 13  ' Alk Phosphatase 44 - 121 IU/L 114 130(H) 111  Total Bilirubin 0.0 - 1.2 mg/dL 0.3 <0.2 0.3    No results found for: TSH, FREET4  CBC Latest Ref Rng & Units 07/26/2018 01/05/2018 09/21/2007  WBC 4.0 - 10.5 K/uL 9.2 14.4(H) 8.3  Hemoglobin 12.0 - 15.0 g/dL 12.8 13.2 13.1  Hematocrit 36.0 - 46.0 % 40.4 43.0 38.7  Platelets 150 - 400 K/uL 296 346 297    No results found for: VD25OH  Clinical ASCVD: No  The 10-year ASCVD risk score (Arnett DK, et al.,  2019) is: 13.5%   Values used to calculate the score:     Age: 25 years     Sex: Female     Is Non-Hispanic African American: No     Diabetic: No     Tobacco smoker: No     Systolic Blood Pressure: 814 mmHg     Is BP treated: No     HDL Cholesterol: 41 mg/dL     Total Cholesterol: 241 mg/dL    Other: (CHADS2VASc if Afib, PHQ9 if depression, MMRC or CAT for COPD, ACT, DEXA)  Social History   Tobacco Use  Smoking Status Former   Years:  44.00   Types: Cigarettes   Quit date: 10/09/2012   Years since quitting: 8.5  Smokeless Tobacco Never  Tobacco Comments   since age 76   BP Readings from Last 3 Encounters:  04/07/21 (!) 146/74  11/21/20 (!) 165/93  08/17/19 129/78   Pulse Readings from Last 3 Encounters:  04/07/21 98  11/21/20 79  08/17/19 90   Wt Readings from Last 3 Encounters:  04/07/21 168 lb 6.4 oz (76.4 kg)  11/21/20 171 lb 3.2 oz (77.7 kg)  10/29/20 168 lb (76.2 kg)    Assessment: Review of patient past medical history, allergies, medications, health status, including review of consultants reports, laboratory and other test data, was performed as part of comprehensive evaluation and provision of chronic care management services.   SDOH:  (Social Determinants of Health) assessments and interventions performed:    CCM Care Plan  Allergies  Allergen Reactions   Adhesive [Tape] Other (See Comments)    "leaves red places on skin"   Bee Pollen    Metoprolol     nightmares   Penicillins Other (See Comments)    Thrush Has patient had a PCN reaction causing immediate rash, facial/tongue/throat swelling, SOB or lightheadedness with hypotension: No Has patient had a PCN reaction causing severe rash involving mucus membranes or skin necrosis: No Has patient had a PCN reaction that required hospitalization: No Has patient had a PCN reaction occurring within the last 10 years: Yes If all of the above answers are "NO", then may proceed with Cephalosporin use.    Radish [Raphanus Sativus] Hives   Yellow Jacket Venom Itching and Rash    Medications Reviewed Today     Reviewed by Lavera Guise, [facility], [place] (Pharmacist) on 04/16/21 at [phone]  Med List Status: <None>   Medication Order Taking? Sig Documenting Provider Last Dose Status Informant  acetaminophen (TYLENOL) 325 MG tablet [id number] No Take 325 mg by mouth every 6 (six) hours as needed for moderate pain.  [provider] Taking Active Self   albuterol (PROVENTIL HFA;VENTOLIN HFA) 108 (90 Base) MCG/ACT inhaler [id number] No Inhale 1-2 puffs into the lungs every 6 (six) hours as needed for wheezing or shortness of breath. [provider] Taking Active Self  aspirin EC 81 MG tablet [id number] No Take 81 mg by mouth daily. Swallow whole. [provider] Taking Active   Budeson-Glycopyrrol-Formoterol (BREZTRI AEROSPHERE) 160-9-4.8 MCG/ACT AERO [id number] No Inhale 2 puffs into the lungs 2 (two) times daily. Loman Brooklyn, FNP Taking Active            Med Note Shara Blazing Mar 23, 2021  9:51 PM) PATIENT ASSISTANCE VIA AZ&ME  busPIRone (BUSPAR) 15 MG tablet [id number] No Take 1 tablet (15 mg total) by mouth 2 (two) times daily. Loman Brooklyn, FNP Taking Active   CALCIUM PO [id number]  No Take by mouth daily. [provider] Taking Active   cetirizine (ZYRTEC) 10 MG tablet [id number] No TAKE ONE TABLET BY MOUTH DAILY Loman Brooklyn, FNP Taking Active   cyclobenzaprine (FLEXERIL) 10 MG tablet [id number] No TAKE ONE TABLET BY MOUTH THREE TIMES A DAY AS NEEDED MUSCLE SPASMS Loman Brooklyn, FNP Taking Active   FLUoxetine (PROZAC) 20 MG capsule [id number] No Take 1 capsule (20 mg total) by mouth daily.  Patient not taking: Reported on 04/07/2021   Loman Brooklyn, FNP Not Taking Active   fluticasone [facility]) 50 MCG/ACT nasal spray [id number] No Place 2 sprays into both nostrils daily. [provider] Taking Active   gabapentin (NEURONTIN) 300 MG capsule [id number] No Take 1 capsule (300 mg total) by mouth 3 (three) times daily. Loman Brooklyn, FNP Taking Active   Melatonin 1 MG TABS [id number] No Take 1 mg by mouth at bedtime as needed (sleep).  [provider] Taking Active Self  ondansetron (ZOFRAN ODT) 4 MG disintegrating tablet [id number] No Take 1 tablet (4 mg total) by mouth every 8 (eight) hours as needed for nausea or vomiting. Loman Brooklyn, FNP Taking Active   pantoprazole  (PROTONIX) 20 MG tablet [id number] No Take 1 tablet (20 mg total) by mouth daily. Loman Brooklyn, FNP Taking Active   pravastatin (PRAVACHOL) 80 MG tablet [id number] No Take 1 tablet (80 mg total) by mouth at bedtime. Loman Brooklyn, FNP Taking Active   VITAMIN D PO [id number] No Take by mouth daily. [provider] Taking Active             Patient Active Problem List   Diagnosis Date Noted   Moderate persistent asthma without complication 80/99/8338   Osteopenia    Generalized anxiety disorder 08/19/2019   Chronic shoulder pain 03/26/2019   OA (osteoarthritis)    Thyroid nodule 10/07/2017   Hydronephrosis 10/07/2017   Thoracic aortic aneurysm 10/07/2017   Neuropathy 10/15/2015   Major depressive disorder, recurrent, moderate ([place]) 10/15/2015   Esophageal reflux 10/15/2015   Notalgia 10/15/2015   Hyperlipidemia 10/15/2015   Pre-diabetes 10/15/2015    Immunization History  Administered Date(s) Administered   Fluad Quad(high Dose 65+) 03/14/2020, 11/21/2020   Moderna Sars-Covid-2 Vaccination 07/18/2019   PFIZER(Purple Top)SARS-COV-2 Vaccination 05/31/2019, 06/22/2019   PNEUMOCOCCAL CONJUGATE-20 11/21/2020   Pneumococcal Conjugate-13 10/25/2017   Tdap 02/05/2013   Zoster Recombinat (Shingrix) 04/07/2021    Conditions to be addressed/monitored: HLD and DMII  Care Plan : PHARMD MEDICATION MANAGEMENT  Updates made by Lavera Guise, [place] since 04/16/2021 12:00 AM     Problem: DISEASE PROGRESSION PREVENTION      Long-Range Goal: ASTHMA/COPD OVERLAP   Recent Progress: Not on track  Note:   Current Barriers:  Unable to independently afford treatment regimen Unable to achieve control of ASTHMA/COPD   Pharmacist Clinical Goal(s):  patient will verbalize ability to afford treatment regimen achieve control of COPD/ASTHMA as evidenced by [place]  through collaboration with PharmD and provider.   Interventions: 1:1  collaboration with Loman Brooklyn, FNP regarding development and update of comprehensive plan of care as evidenced by provider attestation and co-signature Inter-disciplinary care team collaboration (see longitudinal plan of care) Comprehensive medication review performed; medication list updated in electronic medical record  Asthma/likely Chronic Obstructive Pulmonary Disease:  Goal on Track (progressing): YES. Uncontrolled; current treatment: trelegy (patient unable to afford)-->will transition to [facility]; has albuterol rescue inhaler Patient likely with COPD (never diagnosed, but  44 pack yr. History) Pulm consult was put in however patient didn't f/u to schedule New consult put in by PCP  GOLD Classification:  n/A MMRC/CAT score: n/a Most recent Pulmonary Function Testing:  seeing pulm soon 1 exacerbations requiring treatment in the last 6 months  Recommended would start breztri (easy with patient assistance; unable to obtain trelegy); samples of breztri given today Assessed patient finances. Will enroll in az&me patient assistance program Patient stopped smoking in 2014 (44 pk yrs)  Hyperlipidemia Reviewed cholesterol--we will need to switch to rosuvastatin at f/u; pravastatin is likely not strong enough to get her to goal; will likely have to add additional agent Patient admits to just starting pravastatin due to cost (she is now enrolled in the [facility])--will repeat lipids in 3-6 months Lipid Panel     Component Value Date/Time   CHOL 241 (H) 11/21/2020 1402   TRIG 156 (H) 11/21/2020 1402   HDL 41 11/21/2020 1402   CHOLHDL 5.9 (H) 11/21/2020 1402   LDLCALC 171 (H) 11/21/2020 1402   LABVLDL 29 11/21/2020 1402   Patient Goals/Self-Care Activities patient will:  - take medications as prescribed as evidenced by patient report and record review collaborate with provider on medication access solutions      Medication Assistance: Application for breztri  medication  assistance program. in process.  Anticipated assistance start date tbd.  See plan of care for additional detail.  Patient's preferred pharmacy is:  [id number] - [place], [place] [address] Phone: [phone] Fax: [phone]  [facility] - [place], [place] [address] Phone: [phone] Fax: [phone]  Follow Up:  Patient agrees to Care Plan and Follow-up.  Plan: Telephone follow up appointment with care management team member scheduled for:  3 months   Regina Eck, PharmD, BCPS Clinical Pharmacist, [place]  II Phone [phone]

## 2021-04-16 NOTE — Patient Instructions (Signed)
Visit Information  Following are the goals we discussed today:  Current Barriers:  Unable to independently afford treatment regimen Unable to achieve control of ASTHMA/COPD   Pharmacist Clinical Goal(s):  patient will verbalize ability to afford treatment regimen achieve control of COPD/ASTHMA as evidenced by [place] through collaboration with PharmD and provider.   Interventions: 1:1 collaboration with Loman Brooklyn, FNP regarding development and update of comprehensive plan of care as evidenced by provider attestation and co-signature Inter-disciplinary care team collaboration (see longitudinal plan of care) Comprehensive medication review performed; medication list updated in electronic medical record  Asthma/likely Chronic Obstructive Pulmonary Disease:  Goal on Track (progressing): YES. Uncontrolled; current treatment: trelegy (patient unable to afford)-->will transition to [facility]; has albuterol rescue inhaler Patient likely with COPD (never diagnosed, but 44 pack yr. History) Pulm consult was put in however patient didn't f/u to schedule New consult put in by PCP  GOLD Classification:  n/A MMRC/CAT score: n/a Most recent Pulmonary Function Testing:  seeing pulm soon 1 exacerbations requiring treatment in the last 6 months  Recommended would start breztri (easy with patient assistance; unable to obtain trelegy); samples of breztri given today Assessed patient finances. Will enroll in az&me patient assistance program Patient stopped smoking in 2014 (44 pk yrs)  Hyperlipidemia Reviewed cholesterol--we will need to switch to rosuvastatin at f/u; pravastatin is likely not strong enough to get her to goal; will likely have to add additional agent Patient admits to just starting pravastatin due to cost (she is now enrolled in the [facility])--will repeat lipids in 3-6 months Lipid Panel     Component Value Date/Time   CHOL 241  (H) 11/21/2020 1402   TRIG 156 (H) 11/21/2020 1402   HDL 41 11/21/2020 1402   CHOLHDL 5.9 (H) 11/21/2020 1402   LDLCALC 171 (H) 11/21/2020 1402   LABVLDL 29 11/21/2020 1402    Patient Goals/Self-Care Activities patient will:  - take medications as prescribed as evidenced by patient report and record review collaborate with provider on medication access solutions   Plan: Telephone follow up appointment with care management team member scheduled for:  3 months  Signature Regina Eck, PharmD, BCPS Clinical Pharmacist, [place]  II Phone [phone]   Please call the care guide team at [phone] if you need to cancel or reschedule your appointment.   The patient verbalized understanding of instructions, educational materials, and care plan provided today and declined offer to receive copy of patient instructions, educational materials, and care plan.

## 2021-04-24 ENCOUNTER — Ambulatory Visit (HOSPITAL_COMMUNITY)
Admission: RE | Admit: 2021-04-24 | Discharge: 2021-04-24 | Disposition: A | Discharge status: Home / Self Care | Payer: Medicare Other | Source: Ambulatory Visit | Attending: Family Medicine | Admitting: Family Medicine

## 2021-04-24 ENCOUNTER — Other Ambulatory Visit: Payer: Self-pay

## 2021-04-24 DIAGNOSIS — E041 Nontoxic single thyroid nodule: Secondary | ICD-10-CM | POA: Insufficient documentation

## 2021-04-24 DIAGNOSIS — I7121 Aneurysm of the ascending aorta, without rupture: Secondary | ICD-10-CM

## 2021-04-24 DIAGNOSIS — E042 Nontoxic multinodular goiter: Secondary | ICD-10-CM | POA: Diagnosis not present

## 2021-04-24 MED ORDER — IOHEXOL 350 MG/ML SOLN
100.0000 mL | Freq: Once | INTRAVENOUS | Status: AC | PRN
  Administered 2021-04-24: 100 mL via INTRAVENOUS

## 2021-04-26 DIAGNOSIS — I700 Atherosclerosis of aorta: Secondary | ICD-10-CM | POA: Insufficient documentation

## 2021-04-26 DIAGNOSIS — I701 Atherosclerosis of renal artery: Secondary | ICD-10-CM

## 2021-04-26 HISTORY — DX: Atherosclerosis of aorta: I70.0

## 2021-04-26 HISTORY — DX: Atherosclerosis of renal artery: I70.1

## 2021-04-27 ENCOUNTER — Encounter: Payer: Self-pay | Admitting: Family Medicine

## 2021-05-19 ENCOUNTER — Ambulatory Visit: Payer: Medicare Other | Admitting: Family Medicine

## 2021-06-03 ENCOUNTER — Ambulatory Visit: Payer: Medicare Other | Admitting: Family Medicine

## 2021-06-10 ENCOUNTER — Encounter: Payer: Self-pay | Admitting: Family Medicine

## 2021-06-14 ENCOUNTER — Other Ambulatory Visit: Payer: Self-pay | Admitting: Family Medicine

## 2021-06-14 DIAGNOSIS — K219 Gastro-esophageal reflux disease without esophagitis: Secondary | ICD-10-CM

## 2021-06-18 ENCOUNTER — Encounter: Payer: Self-pay | Admitting: Family Medicine

## 2021-06-18 ENCOUNTER — Ambulatory Visit (INDEPENDENT_AMBULATORY_CARE_PROVIDER_SITE_OTHER): Payer: Medicare Other | Admitting: Family Medicine

## 2021-06-18 DIAGNOSIS — F411 Generalized anxiety disorder: Secondary | ICD-10-CM | POA: Diagnosis not present

## 2021-06-18 DIAGNOSIS — F331 Major depressive disorder, recurrent, moderate: Secondary | ICD-10-CM

## 2021-06-18 MED ORDER — FLUOXETINE HCL 20 MG PO CAPS: 20.0000 mg | ORAL_CAPSULE | Freq: Every day | ORAL | 1 refills | Status: DC

## 2021-06-18 NOTE — Progress Notes (Signed)
? ?Virtual Visit via Telephone Note ? ?I connected with Shade Flood on 06/18/21 at 11:11 AM by telephone and verified that I am speaking with the correct person using two identifiers. Joyce Ross is currently located at home and nobody is currently with her during this visit. The provider, Loman Brooklyn, FNP is located in their home at time of visit. ? ?I discussed the limitations, risks, security and privacy concerns of performing an evaluation and management service by telephone and the availability of in person appointments. I also discussed with the patient that there may be a patient responsible charge related to this service. The patient expressed understanding and agreed to proceed. ? ?Subjective: ?PCP: Loman Brooklyn, FNP ? ?Chief Complaint  ?Patient presents with  ? Depression  ? Anxiety  ? ?Patient is following up on depression and anxiety.  She was prescribed Prozac and was hesitant to start it.  At our last visit we discussed her hesitation and she was agreeable in starting.  She reports today she has had some family issues in the past month that have really got her down.  Her daughter-in-law has accused her of being a toxic person and will not let her see her grandbabies.  She does admit that she is crying a lot because of this. ? ? ?ROS: Per HPI ? ?Current Outpatient Medications:  ?  acetaminophen (TYLENOL) 325 MG tablet, Take 325 mg by mouth every 6 (six) hours as needed for moderate pain. , Disp: , Rfl:  ?  albuterol (PROVENTIL HFA;VENTOLIN HFA) 108 (90 Base) MCG/ACT inhaler, Inhale 1-2 puffs into the lungs every 6 (six) hours as needed for wheezing or shortness of breath., Disp: , Rfl:  ?  aspirin EC 81 MG tablet, Take 81 mg by mouth daily. Swallow whole., Disp: , Rfl:  ?  Budeson-Glycopyrrol-Formoterol (BREZTRI AEROSPHERE) 160-9-4.8 MCG/ACT AERO, Inhale 2 puffs into the lungs 2 (two) times daily., Disp: 32.1 g, Rfl: 11 ?  busPIRone (BUSPAR) 15 MG tablet, Take 1 tablet (15 mg total)  by mouth 2 (two) times daily., Disp: 180 tablet, Rfl: 1 ?  CALCIUM PO, Take by mouth daily., Disp: , Rfl:  ?  cetirizine (ZYRTEC) 10 MG tablet, TAKE ONE TABLET BY MOUTH DAILY, Disp: 90 tablet, Rfl: 1 ?  cyclobenzaprine (FLEXERIL) 10 MG tablet, TAKE ONE TABLET BY MOUTH THREE TIMES A DAY AS NEEDED MUSCLE SPASMS, Disp: 60 tablet, Rfl: 2 ?  FLUoxetine (PROZAC) 20 MG capsule, Take 1 capsule (20 mg total) by mouth daily. (Patient not taking: Reported on 04/07/2021), Disp: 30 capsule, Rfl: 2 ?  fluticasone (FLONASE) 50 MCG/ACT nasal spray, Place 2 sprays into both nostrils daily., Disp: , Rfl:  ?  gabapentin (NEURONTIN) 300 MG capsule, Take 1 capsule (300 mg total) by mouth 3 (three) times daily., Disp: 270 capsule, Rfl: 1 ?  Melatonin 1 MG TABS, Take 1 mg by mouth at bedtime as needed (sleep). , Disp: , Rfl:  ?  ondansetron (ZOFRAN ODT) 4 MG disintegrating tablet, Take 1 tablet (4 mg total) by mouth every 8 (eight) hours as needed for nausea or vomiting., Disp: 30 tablet, Rfl: 2 ?  pantoprazole (PROTONIX) 20 MG tablet, TAKE ONE TABLET BY MOUTH DAILY, Disp: 90 tablet, Rfl: 0 ?  pravastatin (PRAVACHOL) 80 MG tablet, Take 1 tablet (80 mg total) by mouth at bedtime., Disp: 90 tablet, Rfl: 1 ?  VITAMIN D PO, Take by mouth daily., Disp: , Rfl:  ? ?Allergies  ?Allergen Reactions  ? Adhesive [  Tape] Other (See Comments)  ?  "leaves red places on skin"  ? Bee Pollen   ? Metoprolol   ?  nightmares  ? Penicillins Other (See Comments)  ?  Thrush ?Has patient had a PCN reaction causing immediate rash, facial/tongue/throat swelling, SOB or lightheadedness with hypotension: No ?Has patient had a PCN reaction causing severe rash involving mucus membranes or skin necrosis: No ?Has patient had a PCN reaction that required hospitalization: No ?Has patient had a PCN reaction occurring within the last 10 years: Yes ?If all of the above answers are "NO", then may proceed with Cephalosporin use. ?  ? Radish [Raphanus Sativus] Hives  ? Yellow  Jacket Venom Itching and Rash  ? ?Past Medical History:  ?Diagnosis Date  ? Aortic atherosclerosis ([place]) 04/26/2021  ? Coronary artery calcification seen on CAT scan 09/28/2017  ? Full dentures   ? GERD (gastroesophageal reflux disease)   ? Heart murmur   ? found a couple of years ago no problems  ? History of kidney stones   ? History of recurrent UTIs   ? Hyperlipidemia   ? Left-sided Bell's palsy 1990s  ? residual left facial drooping  ? Mild intermittent asthma   ? followed by pcp  ? Numbness and tingling in left arm   ? residual from cervical neck surgery going from shoulder down to hand, not completely  ? OA (osteoarthritis)   ? hands, spine, hips, right leg  ? Osteopenia   ? Peripheral neuropathy   ? feet  ? PONV (postoperative nausea and vomiting)   ? Pyelonephritis 09/20/2020  ? Renal artery stenosis ([place]) 04/26/2021  ? mild stenosis of left renal artery on CT Chest  ? Thoracic ascending aortic aneurysm ([place])   ? CT 09-28-2017--- 4.3 cm. CT 04-26-2021 --- 4.1 cm.  ? Thyroid nodule   ? ultrasound w/ bx 09/ 2019,  per pt was benign  ? White coat syndrome without hypertension   ? ? ?Observations/Objective: ?A&O  ?No respiratory distress or wheezing audible over the phone ?Mood, judgement, and thought processes all WNL ? ?Assessment and Plan: ?1-2. Major depressive disorder, recurrent, moderate (HCC)/Generalized anxiety disorder ?Uncontrolled at this time due to current circumstances.  Patient would like to wait before making any changes to her regimen. ? ? ?Follow Up Instructions: ?Return in about 3 months (around 09/18/2021) for annual physical. ? ?I discussed the assessment and treatment plan with the patient. The patient was provided an opportunity to ask questions and all were answered. The patient agreed with the plan and demonstrated an understanding of the instructions. ?  ?The patient was advised to call back or seek an in-person evaluation if the symptoms worsen or if the condition fails to improve as  anticipated. ? ?The above assessment and management plan was discussed with the patient. The patient verbalized understanding of and has agreed to the management plan. Patient is aware to call the clinic if symptoms persist or worsen. Patient is aware when to return to the clinic for a follow-up visit. Patient educated on when it is appropriate to go to the emergency department.  ? ?Time call ended: 11:22 AM ? ?I provided 11 minutes of non-face-to-face time during this encounter. ? ?Hendricks Limes, MSN, APRN, FNP-C ?[place] ?06/18/21 ? ? ?

## 2021-06-23 ENCOUNTER — Other Ambulatory Visit: Payer: Self-pay | Admitting: Family Medicine

## 2021-06-23 DIAGNOSIS — F331 Major depressive disorder, recurrent, moderate: Secondary | ICD-10-CM

## 2021-06-23 DIAGNOSIS — F411 Generalized anxiety disorder: Secondary | ICD-10-CM

## 2021-06-25 ENCOUNTER — Telehealth: Payer: Medicare Other

## 2021-06-30 ENCOUNTER — Other Ambulatory Visit: Payer: Self-pay | Admitting: Family Medicine

## 2021-07-26 ENCOUNTER — Other Ambulatory Visit: Payer: Self-pay | Admitting: Family Medicine

## 2021-07-26 DIAGNOSIS — E782 Mixed hyperlipidemia: Secondary | ICD-10-CM

## 2021-08-30 ENCOUNTER — Other Ambulatory Visit: Payer: Self-pay | Admitting: Family Medicine

## 2021-08-30 DIAGNOSIS — E782 Mixed hyperlipidemia: Secondary | ICD-10-CM

## 2021-09-24 ENCOUNTER — Encounter: Payer: Self-pay | Admitting: Family Medicine

## 2021-09-24 ENCOUNTER — Other Ambulatory Visit: Payer: Self-pay | Admitting: *Deleted

## 2021-09-24 DIAGNOSIS — E782 Mixed hyperlipidemia: Secondary | ICD-10-CM

## 2021-09-24 NOTE — Telephone Encounter (Signed)
NTBS 30 days given 08/30/21. PCP currently [facility]

## 2021-09-24 NOTE — Telephone Encounter (Signed)
Left message to call to schedule appt and sent letter

## 2021-09-29 ENCOUNTER — Other Ambulatory Visit: Payer: Self-pay | Admitting: Family Medicine

## 2021-09-29 DIAGNOSIS — E782 Mixed hyperlipidemia: Secondary | ICD-10-CM

## 2021-10-28 ENCOUNTER — Ambulatory Visit (INDEPENDENT_AMBULATORY_CARE_PROVIDER_SITE_OTHER): Payer: Medicare Other | Admitting: Family Medicine

## 2021-10-28 ENCOUNTER — Encounter: Payer: Self-pay | Admitting: Family Medicine

## 2021-10-28 VITALS — BP 134/61 | HR 53 | Temp 97.8°F | Resp 20 | Ht <= 58 in | Wt 166.0 lb

## 2021-10-28 DIAGNOSIS — R252 Cramp and spasm: Secondary | ICD-10-CM

## 2021-10-28 DIAGNOSIS — E782 Mixed hyperlipidemia: Secondary | ICD-10-CM | POA: Diagnosis not present

## 2021-10-28 DIAGNOSIS — I7121 Aneurysm of the ascending aorta, without rupture: Secondary | ICD-10-CM | POA: Diagnosis not present

## 2021-10-28 DIAGNOSIS — K219 Gastro-esophageal reflux disease without esophagitis: Secondary | ICD-10-CM | POA: Diagnosis not present

## 2021-10-28 DIAGNOSIS — J4540 Moderate persistent asthma, uncomplicated: Secondary | ICD-10-CM

## 2021-10-28 DIAGNOSIS — G629 Polyneuropathy, unspecified: Secondary | ICD-10-CM | POA: Diagnosis not present

## 2021-10-28 DIAGNOSIS — F411 Generalized anxiety disorder: Secondary | ICD-10-CM

## 2021-10-28 DIAGNOSIS — R7303 Prediabetes: Secondary | ICD-10-CM

## 2021-10-28 DIAGNOSIS — M8589 Other specified disorders of bone density and structure, multiple sites: Secondary | ICD-10-CM

## 2021-10-28 DIAGNOSIS — J302 Other seasonal allergic rhinitis: Secondary | ICD-10-CM

## 2021-10-28 DIAGNOSIS — I700 Atherosclerosis of aorta: Secondary | ICD-10-CM | POA: Diagnosis not present

## 2021-10-28 DIAGNOSIS — E041 Nontoxic single thyroid nodule: Secondary | ICD-10-CM

## 2021-10-28 DIAGNOSIS — N1831 Chronic kidney disease, stage 3a: Secondary | ICD-10-CM | POA: Diagnosis not present

## 2021-10-28 DIAGNOSIS — J309 Allergic rhinitis, unspecified: Secondary | ICD-10-CM

## 2021-10-28 DIAGNOSIS — R6889 Other general symptoms and signs: Secondary | ICD-10-CM | POA: Diagnosis not present

## 2021-10-28 DIAGNOSIS — Z23 Encounter for immunization: Secondary | ICD-10-CM | POA: Diagnosis not present

## 2021-10-28 DIAGNOSIS — F331 Major depressive disorder, recurrent, moderate: Secondary | ICD-10-CM

## 2021-10-28 MED ORDER — PRAVASTATIN SODIUM 80 MG PO TABS: 80.0000 mg | ORAL_TABLET | Freq: Every day | ORAL | 1 refills | Status: DC

## 2021-10-28 MED ORDER — LEVOCETIRIZINE DIHYDROCHLORIDE 5 MG PO TABS: 5.0000 mg | ORAL_TABLET | Freq: Every evening | ORAL | 1 refills | Status: DC

## 2021-10-28 MED ORDER — GABAPENTIN 300 MG PO CAPS: 300.0000 mg | ORAL_CAPSULE | Freq: Three times a day (TID) | ORAL | 1 refills | Status: DC

## 2021-10-28 MED ORDER — BUSPIRONE HCL 15 MG PO TABS: 15.0000 mg | ORAL_TABLET | Freq: Two times a day (BID) | ORAL | 1 refills | Status: DC

## 2021-10-28 MED ORDER — FLUTICASONE PROPIONATE 50 MCG/ACT NA SUSP: 2.0000 | Freq: Every day | NASAL | 2 refills | Status: DC

## 2021-10-28 MED ORDER — FLUOXETINE HCL 20 MG PO CAPS: 20.0000 mg | ORAL_CAPSULE | Freq: Every day | ORAL | 1 refills | Status: DC

## 2021-10-28 MED ORDER — CETIRIZINE HCL 10 MG PO TABS: 10.0000 mg | ORAL_TABLET | Freq: Every day | ORAL | 1 refills | Status: DC

## 2021-10-28 MED ORDER — OLOPATADINE HCL 0.2 % OP SOLN: 1.0000 [drp] | Freq: Every day | OPHTHALMIC | 0 refills | Status: DC

## 2021-10-28 MED ORDER — PANTOPRAZOLE SODIUM 20 MG PO TBEC: 20.0000 mg | DELAYED_RELEASE_TABLET | Freq: Every day | ORAL | 1 refills | Status: DC

## 2021-10-28 NOTE — Patient Instructions (Addendum)
Please complete your Cologuard this month.  Stop Zyrtec. Start Xyzal, Flonase, and Pataday eye drops.

## 2021-10-28 NOTE — Progress Notes (Unsigned)
Assessment & Plan:  1. Stage 3a chronic kidney disease ([place]) - CMP14+EGFR  2. Generalized anxiety disorder Well controlled on current regimen.  - busPIRone (BUSPAR) 15 MG tablet; Take 1 tablet (15 mg total) by mouth 2 (two) times daily.  Dispense: 180 tablet; Refill: 1 - FLUoxetine (PROZAC) 20 MG capsule; Take 1 capsule (20 mg total) by mouth daily.  Dispense: 90 capsule; Refill: 1 - CMP14+EGFR  3. Major depressive disorder, recurrent, moderate (HCC) Well controlled on current regimen.  - FLUoxetine (PROZAC) 20 MG capsule; Take 1 capsule (20 mg total) by mouth daily.  Dispense: 90 capsule; Refill: 1 - CMP14+EGFR - Anemia Profile B  4. Neuropathy Well controlled on current regimen.  - gabapentin (NEURONTIN) 300 MG capsule; Take 1 capsule (300 mg total) by mouth 3 (three) times daily.  Dispense: 270 capsule; Refill: 1 - CMP14+EGFR  5. Gastroesophageal reflux disease, unspecified whether esophagitis present Well controlled on current regimen.  - pantoprazole (PROTONIX) 20 MG tablet; Take 1 tablet (20 mg total) by mouth daily.  Dispense: 90 tablet; Refill: 1 - CMP14+EGFR  6. Pre-diabetes - Bayer DCA Hb A1c Waived  7. Mixed hyperlipidemia - CMP14+EGFR - Lipid panel - atorvastatin (LIPITOR) 40 MG tablet; Take 1 tablet (40 mg total) by mouth daily.  Dispense: 90 tablet; Refill: 1  8. Aneurysm of ascending aorta without rupture (HCC) Continue atorvastatin and aspirin. - Lipid panel - atorvastatin (LIPITOR) 40 MG tablet; Take 1 tablet (40 mg total) by mouth daily.  Dispense: 90 tablet; Refill: 1  9. Aortic atherosclerosis (HCC) Continue atorvastatin and aspirin. - Lipid panel - atorvastatin (LIPITOR) 40 MG tablet; Take 1 tablet (40 mg total) by mouth daily.  Dispense: 90 tablet; Refill: 1  10. Moderate persistent asthma without complication Well controlled on current regimen.   11. Muscle cramping - CMP14+EGFR - TSH - Magnesium - Anemia Profile B  12. Thyroid  nodule Repeat imaging due March 2024. - TSH  13. Osteopenia of multiple sites Continue vitamin D; encouraged to resume calcium.  14. Seasonal allergies Uncontrolled. Stop Zyrtec. Start Flonase, Xyzal and [place]. - fluticasone (FLONASE) 50 MCG/ACT nasal spray; Place 2 sprays into both nostrils daily.  Dispense: 16 g; Refill: 2 - levocetirizine (XYZAL) 5 MG tablet; Take 1 tablet (5 mg total) by mouth every evening.  Dispense: 90 tablet; Refill: 1 - Olopatadine HCl 0.2 % SOLN; Apply 1 drop to eye daily.  Dispense: 2.5 mL; Refill: 0  15. Immunization due - Flu Vaccine QUAD High Dose(Fluad) - Zoster Recombinant (Shingrix )   Return in about 4 months (around 02/27/2022) for follow-up of chronic medication conditions.  Hendricks Limes, MSN, APRN, FNP-C [facility]  Subjective:    [name], female    DOB: [birth year], 71 y.o.   MRN: [id number]  Patient Care Team: Loman Brooklyn, FNP as PCP - General (Family Medicine) Harlen Labs, MD as Referring Physician (Optometry) Lucas Mallow, MD as Consulting Physician (Urology) Lavera Guise, [employer] as Pharmacist (Family Medicine)   Chief Complaint:  Chief Complaint  Patient presents with   Medical Management of Chronic Issues    HPI: Joyce Ross is a 71 y.o. female presenting on 10/28/2021 for Medical Management of Chronic Issues   CKD: stage 3a.    GERD: controlled with Protonix.    Hyperlipidemia: taking Pravastatin daily.   Asthma: patient has Albuterol as needed and is using Breztri twice daily.    Osteopenia: she is taking  a vitamin D supplement, but not calcium. She has been getting out and walking more for her weight bearing exercise. Her last DEXA was completed on 11/14/2019.    Osteoarthritis: patient takes Tylenol as needed.   Neuropathy: doing well with gabapentin.    Thoracic ascending aortic aneurysm: noted on chest CT on 09/28/2017. Most recent CT in March 2023  showed a stable aneurysm.    Prediabetes: patient has been cooking more at home instead of eating out so much. She is walking for exercise. Last A1c 5.7 in October 2022.    Thyroid nodule: patient had a thyroid nodule biopsy completed on 11/09/2017 which resulted as BENIGN FOLLICULAR NODULE (BETHESDA CATEGORY II). She had a repeat ultrasound in March 2023, which recommended a one year follow-up.   Depression/Anxiety: Patient has been taking Prozac since February 2023. She has been taking BuSpar once per day at night before bed. She has previously failed treatment with Celexa.  She reports today things have not changed much in her life, but she is doing okay dealing with it.     10/28/2021    3:22 PM 04/07/2021    2:00 PM 02/24/2021    2:07 PM  Depression screen PHQ 2/9  Decreased Interest 1 0 0  Down, Depressed, Hopeless 1 0 1  PHQ - 2 Score 2 0 1  Altered sleeping 1 0 0  Tired, decreased energy '1 1 2  ' Change in appetite '2 1 1  ' Feeling bad or failure about yourself  '1 2 2  ' Trouble concentrating 1 1 0  Moving slowly or fidgety/restless 0 0 0  Suicidal thoughts 0 0 0  PHQ-9 Score '8 5 6  ' Difficult doing work/chores Somewhat difficult Somewhat difficult Somewhat difficult      10/28/2021    3:23 PM 04/07/2021    2:01 PM 02/24/2021    2:12 PM 11/21/2020    2:43 PM  GAD 7 : Generalized Anxiety Score  Nervous, Anxious, on Edge '1 1 1 1  ' Control/stop worrying '1 1 1 ' 0  Worry too much - different things 1 1 0 1  Trouble relaxing 1 0 0 0  Restless 1 0 0 0  Easily annoyed or irritable '1 1 1 1  ' Afraid - awful might happen 1 1 0 1  Total GAD 7 Score '7 5 3 4  ' Anxiety Difficulty Somewhat difficult Somewhat difficult Not difficult at all Somewhat difficult    New complaints: Patient reports her right ear keeps popping and it sounds like she is under water. This just started this week.  She does also experience sneezing, runny nose, and itchy/watery eyes.    Social history:  Relevant past  medical, surgical, family and social history reviewed and updated as indicated. Interim medical history since our last visit reviewed.  Allergies and medications reviewed and updated.  DATA REVIEWED: CHART IN EPIC  ROS: Negative unless specifically indicated above in HPI.    Current Outpatient Medications:    acetaminophen (TYLENOL) 325 MG tablet, Take 325 mg by mouth every 6 (six) hours as needed for moderate pain. , Disp: , Rfl:    albuterol (PROVENTIL HFA;VENTOLIN HFA) 108 (90 Base) MCG/ACT inhaler, Inhale 1-2 puffs into the lungs every 6 (six) hours as needed for wheezing or shortness of breath., Disp: , Rfl:    aspirin EC 81 MG tablet, Take 81 mg by mouth daily. Swallow whole., Disp: , Rfl:    Budeson-Glycopyrrol-Formoterol (BREZTRI AEROSPHERE) 160-9-4.8 MCG/ACT AERO, Inhale 2 puffs into the lungs 2 (  two) times daily., Disp: 32.1 g, Rfl: 11   busPIRone (BUSPAR) 15 MG tablet, Take 1 tablet (15 mg total) by mouth 2 (two) times daily., Disp: 180 tablet, Rfl: 1   cetirizine (ZYRTEC) 10 MG tablet, TAKE ONE TABLET BY MOUTH DAILY, Disp: 90 tablet, Rfl: 1   cyclobenzaprine (FLEXERIL) 10 MG tablet, TAKE ONE TABLET BY MOUTH THREE TIMES A DAY AS NEEDED FOR MUSCLE SPASMS, Disp: 60 tablet, Rfl: 2   FLUoxetine (PROZAC) 20 MG capsule, Take 1 capsule (20 mg total) by mouth daily., Disp: 90 capsule, Rfl: 1   gabapentin (NEURONTIN) 300 MG capsule, Take 1 capsule (300 mg total) by mouth 3 (three) times daily., Disp: 270 capsule, Rfl: 1   Melatonin 1 MG TABS, Take 1 mg by mouth at bedtime as needed (sleep). , Disp: , Rfl:    ondansetron (ZOFRAN ODT) 4 MG disintegrating tablet, Take 1 tablet (4 mg total) by mouth every 8 (eight) hours as needed for nausea or vomiting., Disp: 30 tablet, Rfl: 2   pantoprazole (PROTONIX) 20 MG tablet, TAKE ONE TABLET BY MOUTH DAILY, Disp: 90 tablet, Rfl: 0   pravastatin (PRAVACHOL) 80 MG tablet, TAKE ONE TABLET BY MOUTH EVERY NIGHT AT BEDTIME, FURTHER REFILLS REQUIRE APPOINTMENT,  Disp: 30 tablet, Rfl: 0   VITAMIN D PO, Take by mouth daily., Disp: , Rfl:    CALCIUM PO, Take by mouth daily. (Patient not taking: Reported on 10/28/2021), Disp: , Rfl:    fluticasone (FLONASE) 50 MCG/ACT nasal spray, Place 2 sprays into both nostrils daily. (Patient not taking: Reported on 10/28/2021), Disp: , Rfl:    Allergies  Allergen Reactions   Adhesive [Tape] Other (See Comments)    "leaves red places on skin"   Bee Pollen    Metoprolol     nightmares   Penicillins Other (See Comments)    Thrush Has patient had a PCN reaction causing immediate rash, facial/tongue/throat swelling, SOB or lightheadedness with hypotension: No Has patient had a PCN reaction causing severe rash involving mucus membranes or skin necrosis: No Has patient had a PCN reaction that required hospitalization: No Has patient had a PCN reaction occurring within the last 10 years: Yes If all of the above answers are "NO", then may proceed with Cephalosporin use.    Radish [Radish (Raphanus Sativus)] Hives   Yellow Jacket Venom Itching and Rash   Past Medical History:  Diagnosis Date   Aortic atherosclerosis ([place]) 04/26/2021   Coronary artery calcification seen on CAT scan 09/28/2017   Full dentures    GERD (gastroesophageal reflux disease)    Heart murmur    found a couple of years ago no problems   History of kidney stones    History of recurrent UTIs    Hyperlipidemia    Left-sided Bell's palsy 1990s   residual left facial drooping   Mild intermittent asthma    followed by pcp   Numbness and tingling in left arm    residual from cervical neck surgery going from shoulder down to hand, not completely   OA (osteoarthritis)    hands, spine, hips, right leg   Osteopenia    Peripheral neuropathy    feet   PONV (postoperative nausea and vomiting)    Pyelonephritis 09/20/2020   Renal artery stenosis (HCC) 04/26/2021   mild stenosis of left renal artery on CT Chest   Thoracic ascending aortic aneurysm  ([place])    CT 09-28-2017--- 4.3 cm. CT 04-26-2021 --- 4.1 cm.   Thyroid nodule  ultrasound w/ bx 09/ 2019,  per pt was benign   White coat syndrome without hypertension     Past Surgical History:  Procedure Laterality Date   ANTERIOR CERVICAL DECOMP/DISCECTOMY FUSION  1980s   CARPAL TUNNEL RELEASE Bilateral 2000   CESAREAN SECTION  x2 last one [place], URETEROSCOPY AND STENT PLACEMENT Left 01/11/2018   Procedure: CYSTOSCOPY, URETEROSCOPY AND STENT PLACEMENT;  Surgeon: Lucas Mallow, MD;  Location: [facility];  Service: Urology;  Laterality: Left;   CYSTOSCOPY/URETEROSCOPY/HOLMIUM LASER/STENT PLACEMENT Left 07/31/2018   Procedure: CYSTOSCOPY/URETEROSCOPY/HOLMIUM LASER/STENT PLACEMENT;  Surgeon: Lucas Mallow, MD;  Location: [facility];  Service: Urology;  Laterality: Left;   EXTRACORPOREAL SHOCK WAVE LITHOTRIPSY Left 12/05/2017   Procedure: LEFT EXTRACORPOREAL SHOCK WAVE LITHOTRIPSY (ESWL);  Surgeon: Franchot Gallo, MD;  Location: [facility];  Service: Urology;  Laterality: Left;   EXTRACORPOREAL SHOCK WAVE LITHOTRIPSY  1990s   HOLMIUM LASER APPLICATION Left 81/15/7262   Procedure: HOLMIUM LASER APPLICATION;  Surgeon: Lucas Mallow, MD;  Location: [facility];  Service: Urology;  Laterality: Left;   MOUTH SURGERY     all teeth pulled    Social History   Socioeconomic History   Marital status: Widowed    Spouse name: Not on file   Number of children: 2   Years of education: 12th Grade   Highest education level: High school graduate  Occupational History   Occupation: retired  Tobacco Use   Smoking status: Former    Years: 44.00    Types: Cigarettes    Quit date: 10/09/2012    Years since quitting: 9.0   Smokeless tobacco: Never   Tobacco comments:    since age 46  Vaping Use   Vaping Use: Never used  Substance and Sexual Activity   Alcohol use: No   Drug use: No    Comment: per pt last used "pot" 1970s   Sexual activity: Not Currently     Birth control/protection: Post-menopausal  Other Topics Concern   Not on file  Social History Narrative   Son lives with her   Social Determinants of Health   Financial Resource Strain: Low Risk  (10/29/2020)   Overall Financial Resource Strain (CARDIA)    Difficulty of Paying Living Expenses: Not hard at all  Food Insecurity: No [place] (10/29/2020)   Hunger Vital Sign    Worried About Running Out of Food in the Last Year: Never true    [place] in the Last Year: Never true  Transportation Needs: No Transportation Needs (10/29/2020)   PRAPARE - Hydrologist (Medical): No    Lack of Transportation (Non-Medical): No  Physical Activity: Sufficiently Active (10/29/2020)   Exercise Vital Sign    Days of Exercise per Week: 4 days    Minutes of Exercise per Session: 40 min  Stress: No Stress Concern Present (10/29/2020)   [place]    Feeling of Stress : Only a little  Social Connections: Socially Isolated (10/29/2020)   Social Connection and Isolation Panel [NHANES]    Frequency of Communication with Friends and Family: Twice a week    Frequency of Social Gatherings with Friends and Family: Never    Attends Religious Services: Never    Marine scientist or Organizations: No    Attends Archivist Meetings: Never    Marital Status: Widowed  Intimate Partner Violence: Not At  Risk (10/29/2020)   Humiliation, Afraid, Rape, and Kick questionnaire    Fear of Current or Ex-Partner: No    Emotionally Abused: No    Physically Abused: No    Sexually Abused: No        Objective:    BP 134/61   Pulse (!) 53   Temp 97.8 F (36.6 C)   Resp 20   Ht '4\' 10"'  (1.473 m)   Wt 166 lb (75.3 kg)   SpO2 93%   BMI 34.69 kg/m   Wt Readings from Last 3 Encounters:  10/28/21 166 lb (75.3 kg)  04/07/21 168 lb 6.4 oz (76.4 kg)  11/21/20 171 lb 3.2 oz (77.7 kg)    Physical  Exam Vitals reviewed.  Constitutional:      General: She is not in acute distress.    Appearance: Normal appearance. She is obese. She is not ill-appearing, toxic-appearing or diaphoretic.  HENT:     Head: Normocephalic and atraumatic.     Right Ear: Ear canal and external ear normal. There is no impacted cerumen. Tympanic membrane is bulging. Tympanic membrane is not injected, perforated or erythematous.     Left Ear: Ear canal and external ear normal. There is no impacted cerumen. Tympanic membrane is bulging. Tympanic membrane is not injected, perforated or erythematous.     Nose:     Right Turbinates: Swollen and pale.     Left Turbinates: Swollen and pale.  Eyes:     General: No scleral icterus.       Right eye: No discharge.        Left eye: No discharge.     Conjunctiva/sclera: Conjunctivae normal.  Cardiovascular:     Rate and Rhythm: Normal rate and regular rhythm.     Heart sounds: Normal heart sounds. No murmur heard.    No friction rub. No gallop.  Pulmonary:     Effort: Pulmonary effort is normal. No respiratory distress.     Breath sounds: Normal breath sounds. No stridor. No wheezing, rhonchi or rales.  Musculoskeletal:        General: Normal range of motion.     Cervical back: Normal range of motion.  Skin:    General: Skin is warm and dry.     Capillary Refill: Capillary refill takes less than 2 seconds.  Neurological:     General: No focal deficit present.     Mental Status: She is alert and oriented to person, place, and time. Mental status is at baseline.  Psychiatric:        Mood and Affect: Mood normal.        Behavior: Behavior normal.        Thought Content: Thought content normal.        Judgment: Judgment normal.     No results found for: "TSH" Lab Results  Component Value Date   WBC 9.2 07/26/2018   HGB 12.8 07/26/2018   HCT 40.4 07/26/2018   MCV 99.0 07/26/2018   PLT 296 07/26/2018   Lab Results  Component Value Date   NA 144 11/21/2020    K 3.8 11/21/2020   CO2 25 11/21/2020   GLUCOSE 95 11/21/2020   BUN 18 11/21/2020   CREATININE 1.08 (H) 11/21/2020   BILITOT 0.3 11/21/2020   ALKPHOS 114 11/21/2020   AST 25 11/21/2020   ALT 20 11/21/2020   PROT 6.9 11/21/2020   ALBUMIN 4.1 11/21/2020   CALCIUM 8.9 11/21/2020   ANIONGAP 10 07/26/2018   EGFR 56 (  L) 11/21/2020   Lab Results  Component Value Date   CHOL 241 (H) 11/21/2020   Lab Results  Component Value Date   HDL 41 11/21/2020   Lab Results  Component Value Date   LDLCALC 171 (H) 11/21/2020   Lab Results  Component Value Date   TRIG 156 (H) 11/21/2020   Lab Results  Component Value Date   CHOLHDL 5.9 (H) 11/21/2020   Lab Results  Component Value Date   HGBA1C 5.7 (H) 11/21/2020

## 2021-10-29 ENCOUNTER — Other Ambulatory Visit: Payer: Self-pay | Admitting: Family Medicine

## 2021-10-29 DIAGNOSIS — K219 Gastro-esophageal reflux disease without esophagitis: Secondary | ICD-10-CM

## 2021-10-29 DIAGNOSIS — F411 Generalized anxiety disorder: Secondary | ICD-10-CM

## 2021-10-29 LAB — TSH: TSH: 0.772 u[IU]/mL (ref 0.450–4.500)

## 2021-10-29 LAB — BAYER DCA HB A1C WAIVED: HB A1C (BAYER DCA - WAIVED): 5.7 % — ABNORMAL HIGH (ref 4.8–5.6)

## 2021-10-29 LAB — CMP14+EGFR
ALT: 18 IU/L (ref 0–32)
AST: 22 IU/L (ref 0–40)
Albumin/Globulin Ratio: 1.7 (ref 1.2–2.2)
Albumin: 4.4 g/dL (ref 3.9–4.9)
Alkaline Phosphatase: 109 IU/L (ref 44–121)
BUN/Creatinine Ratio: 23 (ref 12–28)
BUN: 27 mg/dL (ref 8–27)
Bilirubin Total: 0.2 mg/dL (ref 0.0–1.2)
CO2: 24 mmol/L (ref 20–29)
Calcium: 9.8 mg/dL (ref 8.7–10.3)
Chloride: 104 mmol/L (ref 96–106)
Creatinine, Ser: 1.17 mg/dL — ABNORMAL HIGH (ref 0.57–1.00)
Globulin, Total: 2.6 g/dL (ref 1.5–4.5)
Glucose: 74 mg/dL (ref 70–99)
Potassium: 5 mmol/L (ref 3.5–5.2)
Sodium: 142 mmol/L (ref 134–144)
Total Protein: 7 g/dL (ref 6.0–8.5)
eGFR: 50 mL/min/{1.73_m2} — ABNORMAL LOW (ref >59)

## 2021-10-29 LAB — ANEMIA PROFILE B
Basophils Absolute: 0.1 10*3/uL (ref 0.0–0.2)
Basos: 1 %
EOS (ABSOLUTE): 0.4 10*3/uL (ref 0.0–0.4)
Eos: 4 %
Ferritin: 200 ng/mL — ABNORMAL HIGH (ref 15–150)
Folate: 10.2 ng/mL (ref >3.0)
Hematocrit: 41.4 % (ref 34.0–46.6)
Hemoglobin: 13.7 g/dL (ref 11.1–15.9)
Immature Grans (Abs): 0 10*3/uL (ref 0.0–0.1)
Immature Granulocytes: 0 %
Iron Saturation: 17 % (ref 15–55)
Iron: 53 ug/dL (ref 27–139)
Lymphocytes Absolute: 1.9 10*3/uL (ref 0.7–3.1)
Lymphs: 19 %
MCH: 30.4 pg (ref 26.6–33.0)
MCHC: 33.1 g/dL (ref 31.5–35.7)
MCV: 92 fL (ref 79–97)
Monocytes Absolute: 0.8 10*3/uL (ref 0.1–0.9)
Monocytes: 8 %
Neutrophils Absolute: 6.7 10*3/uL (ref 1.4–7.0)
Neutrophils: 68 %
Platelets: 320 10*3/uL (ref 150–450)
RBC: 4.51 x10E6/uL (ref 3.77–5.28)
RDW: 12.5 % (ref 11.7–15.4)
Retic Ct Pct: 1.7 % (ref 0.6–2.6)
Total Iron Binding Capacity: 320 ug/dL (ref 250–450)
UIBC: 267 ug/dL (ref 118–369)
Vitamin B-12: 598 pg/mL (ref 232–1245)
WBC: 9.9 10*3/uL (ref 3.4–10.8)

## 2021-10-29 LAB — LIPID PANEL
Chol/HDL Ratio: 4.8 ratio — ABNORMAL HIGH (ref 0.0–4.4)
Cholesterol, Total: 218 mg/dL — ABNORMAL HIGH (ref 100–199)
HDL: 45 mg/dL (ref >39)
LDL Chol Calc (NIH): 149 mg/dL — ABNORMAL HIGH (ref 0–99)
Triglycerides: 135 mg/dL (ref 0–149)
VLDL Cholesterol Cal: 24 mg/dL (ref 5–40)

## 2021-10-29 LAB — MAGNESIUM: Magnesium: 2.1 mg/dL (ref 1.6–2.3)

## 2021-10-29 MED ORDER — ATORVASTATIN CALCIUM 40 MG PO TABS: 40.0000 mg | ORAL_TABLET | Freq: Every day | ORAL | 1 refills | Status: DC

## 2021-10-30 ENCOUNTER — Telehealth: Payer: Self-pay

## 2021-10-30 ENCOUNTER — Encounter: Payer: Self-pay | Admitting: Family Medicine

## 2021-10-30 ENCOUNTER — Ambulatory Visit (INDEPENDENT_AMBULATORY_CARE_PROVIDER_SITE_OTHER): Payer: Medicare Other

## 2021-10-30 VITALS — Wt 166.0 lb

## 2021-10-30 DIAGNOSIS — Z0001 Encounter for general adult medical examination with abnormal findings: Secondary | ICD-10-CM | POA: Diagnosis not present

## 2021-10-30 DIAGNOSIS — J302 Other seasonal allergic rhinitis: Secondary | ICD-10-CM | POA: Insufficient documentation

## 2021-10-30 DIAGNOSIS — Z604 Social exclusion and rejection: Secondary | ICD-10-CM

## 2021-10-30 DIAGNOSIS — N1831 Chronic kidney disease, stage 3a: Secondary | ICD-10-CM | POA: Insufficient documentation

## 2021-10-30 DIAGNOSIS — Z9189 Other specified personal risk factors, not elsewhere classified: Secondary | ICD-10-CM

## 2021-10-30 DIAGNOSIS — Z5941 Food insecurity: Secondary | ICD-10-CM

## 2021-10-30 DIAGNOSIS — Z0000 Encounter for general adult medical examination without abnormal findings: Secondary | ICD-10-CM

## 2021-10-30 HISTORY — DX: Chronic kidney disease, stage 3a: N18.31

## 2021-10-30 NOTE — Patient Instructions (Signed)
Joyce Ross , Thank you for taking time to come for your Medicare Wellness Visit. I appreciate your ongoing commitment to your health goals. Please review the following plan we discussed and let me know if I can assist you in the future.   Screening recommendations/referrals: Colonoscopy: return Cologuard soon Mammogram: Done 03/23/2021 - Repeat annually  Bone Density: Done 11/14/2019 - Repeat every 2 years *due at next visit Recommended yearly ophthalmology/optometry visit for glaucoma screening and checkup Recommended yearly dental visit for hygiene and checkup  Vaccinations: Influenza vaccine: Done 10/28/2021 - Repeat annually  Pneumococcal vaccine: Done 10/25/2017 & 11/21/2020 Tdap vaccine: Done 02/05/2013 - Repeat in 10 years  Shingles vaccine: Done 04/07/2021 & 10/28/2021 Covid-19: Done 05/31/2019, 06/22/2019, 07/18/2019  Advanced directives: Advance directive discussed with you today. I have provided a copy for you to complete at home and have notarized. Once this is complete please bring a copy in to our office so we can scan it into your chart.   Conditions/risks identified: Aim for 30 minutes of exercise or brisk walking, 6-8 glasses of water, and 5 servings of fruits and vegetables each day. I have sent a referral to our care guides to help you sign up for Silver Sneakers - I really think getting out with others is going to help you so much! They will also help put you in contact with food assistance programs.   Next appointment: Follow up in one year for your annual wellness visit    Preventive Care 65 Years and Older, Female Preventive care refers to lifestyle choices and visits with your health care provider that can promote health and wellness. What does preventive care include? A yearly physical exam. This is also called an annual well check. Dental exams once or twice a year. Routine eye exams. Ask your health care provider how often you should have your eyes checked. Personal  lifestyle choices, including: Daily care of your teeth and gums. Regular physical activity. Eating a healthy diet. Avoiding tobacco and drug use. Limiting alcohol use. Practicing safe sex. Taking low-dose aspirin every day. Taking vitamin and mineral supplements as recommended by your health care provider. What happens during an annual well check? The services and screenings done by your health care provider during your annual well check will depend on your age, overall health, lifestyle risk factors, and family history of disease. Counseling  Your health care provider may ask you questions about your: Alcohol use. Tobacco use. Drug use. Emotional well-being. Home and relationship well-being. Sexual activity. Eating habits. History of falls. Memory and ability to understand (cognition). Work and work Statistician. Reproductive health. Screening  You may have the following tests or measurements: Height, weight, and BMI. Blood pressure. Lipid and cholesterol levels. These may be checked every 5 years, or more frequently if you are over 47 years old. Skin check. Lung cancer screening. You may have this screening every year starting at age 7 if you have a 30-pack-year history of smoking and currently smoke or have quit within the past 15 years. Fecal occult blood test (FOBT) of the stool. You may have this test every year starting at age 96. Flexible sigmoidoscopy or colonoscopy. You may have a sigmoidoscopy every 5 years or a colonoscopy every 10 years starting at age 61. Hepatitis C blood test. Hepatitis B blood test. Sexually transmitted disease (STD) testing. Diabetes screening. This is done by checking your blood sugar (glucose) after you have not eaten for a while (fasting). You may have this done every  1-3 years. Bone density scan. This is done to screen for osteoporosis. You may have this done starting at age 81. Mammogram. This may be done every 1-2 years. Talk to your  health care provider about how often you should have regular mammograms. Talk with your health care provider about your test results, treatment options, and if necessary, the need for more tests. Vaccines  Your health care provider may recommend certain vaccines, such as: Influenza vaccine. This is recommended every year. Tetanus, diphtheria, and acellular pertussis (Tdap, Td) vaccine. You may need a Td booster every 10 years. Zoster vaccine. You may need this after age 40. Pneumococcal 13-valent conjugate (PCV13) vaccine. One dose is recommended after age 42. Pneumococcal polysaccharide (PPSV23) vaccine. One dose is recommended after age 67. Talk to your health care provider about which screenings and vaccines you need and how often you need them. This information is not intended to replace advice given to you by your health care provider. Make sure you discuss any questions you have with your health care provider. Document Released: 02/28/2015 Document Revised: 10/22/2015 Document Reviewed: 12/03/2014 Elsevier Interactive Patient Education  2017 [employer] Prevention in the Home Falls can cause injuries. They can happen to people of all ages. There are many things you can do to make your home safe and to help prevent falls. What can I do on the outside of my home? Regularly fix the edges of walkways and driveways and fix any cracks. Remove anything that might make you trip as you walk through a door, such as a raised step or threshold. Trim any bushes or trees on the path to your home. Use bright outdoor lighting. Clear any walking paths of anything that might make someone trip, such as rocks or tools. Regularly check to see if handrails are loose or broken. Make sure that both sides of any steps have handrails. Any raised decks and porches should have guardrails on the edges. Have any leaves, snow, or ice cleared regularly. Use sand or salt on walking paths during winter. Clean  up any spills in your garage right away. This includes oil or grease spills. What can I do in the bathroom? Use night lights. Install grab bars by the toilet and in the tub and shower. Do not use towel bars as grab bars. Use non-skid mats or decals in the tub or shower. If you need to sit down in the shower, use a plastic, non-slip stool. Keep the floor dry. Clean up any water that spills on the floor as soon as it happens. Remove soap buildup in the tub or shower regularly. Attach bath mats securely with double-sided non-slip rug tape. Do not have throw rugs and other things on the floor that can make you trip. What can I do in the bedroom? Use night lights. Make sure that you have a light by your bed that is easy to reach. Do not use any sheets or blankets that are too big for your bed. They should not hang down onto the floor. Have a firm chair that has side arms. You can use this for support while you get dressed. Do not have throw rugs and other things on the floor that can make you trip. What can I do in the kitchen? Clean up any spills right away. Avoid walking on wet floors. Keep items that you use a lot in easy-to-reach places. If you need to reach something above you, use a strong step stool that has a  grab bar. Keep electrical cords out of the way. Do not use floor polish or wax that makes floors slippery. If you must use wax, use non-skid floor wax. Do not have throw rugs and other things on the floor that can make you trip. What can I do with my stairs? Do not leave any items on the stairs. Make sure that there are handrails on both sides of the stairs and use them. Fix handrails that are broken or loose. Make sure that handrails are as long as the stairways. Check any carpeting to make sure that it is firmly attached to the stairs. Fix any carpet that is loose or worn. Avoid having throw rugs at the top or bottom of the stairs. If you do have throw rugs, attach them to the  floor with carpet tape. Make sure that you have a light switch at the top of the stairs and the bottom of the stairs. If you do not have them, ask someone to add them for you. What else can I do to help prevent falls? Wear shoes that: Do not have high heels. Have rubber bottoms. Are comfortable and fit you well. Are closed at the toe. Do not wear sandals. If you use a stepladder: Make sure that it is fully opened. Do not climb a closed stepladder. Make sure that both sides of the stepladder are locked into place. Ask someone to hold it for you, if possible. Clearly mark and make sure that you can see: Any grab bars or handrails. First and last steps. Where the edge of each step is. Use tools that help you move around (mobility aids) if they are needed. These include: Canes. Walkers. Scooters. Crutches. Turn on the lights when you go into a dark area. Replace any light bulbs as soon as they burn out. Set up your furniture so you have a clear path. Avoid moving your furniture around. If any of your floors are uneven, fix them. If there are any pets around you, be aware of where they are. Review your medicines with your doctor. Some medicines can make you feel dizzy. This can increase your chance of falling. Ask your doctor what other things that you can do to help prevent falls. This information is not intended to replace advice given to you by your health care provider. Make sure you discuss any questions you have with your health care provider. Document Released: 11/28/2008 Document Revised: 07/10/2015 Document Reviewed: 03/08/2014 Elsevier Interactive Patient Education  2017 [employer].

## 2021-10-30 NOTE — Progress Notes (Signed)
Subjective:   Joyce Ross is a 71 y.o. female who presents for Medicare Annual (Subsequent) preventive examination.  Virtual Visit via Telephone Note  I connected with  Joyce Ross on 10/30/21 at  8:15 AM EDT by telephone and verified that I am speaking with the correct person using two identifiers.  Location: Patient: Home Provider: [facility] Persons participating in the virtual visit: patient/Nurse Health Advisor   I discussed the limitations, risks, security and privacy concerns of performing an evaluation and management service by telephone and the availability of in person appointments. The patient expressed understanding and agreed to proceed.  Interactive audio and video telecommunications were attempted between this nurse and patient, however failed, due to patient having technical difficulties OR patient did not have access to video capability.  We continued and completed visit with audio only.  Some vital signs may be absent or patient reported.   Preston Garabedian E Vara Mairena, LPN   Review of Systems     Cardiac Risk Factors include: advanced age (>29mn, >>78women);dyslipidemia;sedentary lifestyle;obesity (BMI >30kg/m2);smoking/ tobacco exposure;Other (see comment), Risk factor comments: atherosclerosis, aortic aneurysm     Objective:    Today's Vitals   10/30/21 0833  Weight: 166 lb (75.3 kg)   Body mass index is 34.69 kg/m.     10/30/2021    8:48 AM 10/29/2020    8:51 AM 10/29/2019    9:14 AM 10/27/2018    8:36 AM 07/31/2018   11:22 AM 07/26/2018    3:45 PM 01/05/2018    4:08 PM  Advanced Directives  Does Patient Have a Medical Advance Directive? No No No No No No No  Would patient like information on creating a medical advance directive? Yes (MAU/Ambulatory/Procedural Areas - Information given) No - Patient declined No - Patient declined No - Patient declined No - Patient declined Yes (MAU/Ambulatory/Procedural Areas - Information given)     Current Medications  (verified) Outpatient Encounter Medications as of 10/30/2021  Medication Sig   acetaminophen (TYLENOL) 325 MG tablet Take 325 mg by mouth every 6 (six) hours as needed for moderate pain.    albuterol (PROVENTIL HFA;VENTOLIN HFA) 108 (90 Base) MCG/ACT inhaler Inhale 1-2 puffs into the lungs every 6 (six) hours as needed for wheezing or shortness of breath.   aspirin EC 81 MG tablet Take 81 mg by mouth daily. Swallow whole.   Budeson-Glycopyrrol-Formoterol (BREZTRI AEROSPHERE) 160-9-4.8 MCG/ACT AERO Inhale 2 puffs into the lungs 2 (two) times daily.   busPIRone (BUSPAR) 15 MG tablet Take 1 tablet (15 mg total) by mouth 2 (two) times daily.   CALCIUM PO Take by mouth daily.   Cholecalciferol (VITAMIN D-3) 125 MCG (5000 UT) TABS Take by mouth.   cyclobenzaprine (FLEXERIL) 10 MG tablet TAKE ONE TABLET BY MOUTH THREE TIMES A DAY AS NEEDED FOR MUSCLE SPASMS   FLUoxetine (PROZAC) 20 MG capsule Take 1 capsule (20 mg total) by mouth daily.   fluticasone (FLONASE) 50 MCG/ACT nasal spray Place 2 sprays into both nostrils daily.   gabapentin (NEURONTIN) 300 MG capsule Take 1 capsule (300 mg total) by mouth 3 (three) times daily.   Melatonin 1 MG TABS Take 1 mg by mouth at bedtime as needed (sleep).    Olopatadine HCl 0.2 % SOLN Apply 1 drop to eye daily.   ondansetron (ZOFRAN ODT) 4 MG disintegrating tablet Take 1 tablet (4 mg total) by mouth every 8 (eight) hours as needed for nausea or vomiting.   atorvastatin (LIPITOR) 40 MG tablet Take 1  tablet (40 mg total) by mouth daily.   levocetirizine (XYZAL) 5 MG tablet Take 1 tablet (5 mg total) by mouth every evening.   pantoprazole (PROTONIX) 20 MG tablet Take 1 tablet (20 mg total) by mouth daily.   [DISCONTINUED] VITAMIN D PO Take by mouth daily.   No facility-administered encounter medications on file as of 10/30/2021.    Allergies (verified) Adhesive [tape], Bee pollen, Metoprolol, Penicillins, Radish [radish (raphanus sativus)], and Yellow jacket venom    History: Past Medical History:  Diagnosis Date   Allergy    Anxiety    Aortic atherosclerosis ([place]) 04/26/2021   Cataract    Coronary artery calcification seen on CAT scan 09/28/2017   Depression    Full dentures    GERD (gastroesophageal reflux disease)    Heart murmur    found a couple of years ago no problems   History of kidney stones    History of recurrent UTIs    Hyperlipidemia    Left-sided Bell's palsy 1990s   residual left facial drooping   Mild intermittent asthma    followed by pcp   Numbness and tingling in left arm    residual from cervical neck surgery going from shoulder down to hand, not completely   OA (osteoarthritis)    hands, spine, hips, right leg   Osteopenia    Peripheral neuropathy    feet   PONV (postoperative nausea and vomiting)    Pyelonephritis 09/20/2020   Renal artery stenosis ([facility]) 04/26/2021   mild stenosis of left renal artery on CT Chest   Sleep apnea    Thoracic ascending aortic aneurysm ([place])    CT 09-28-2017--- 4.3 cm. CT 04-26-2021 --- 4.1 cm.   Thyroid nodule    ultrasound w/ bx 09/ 2019,  per pt was benign   White coat syndrome without hypertension    Past Surgical History:  Procedure Laterality Date   ANTERIOR CERVICAL DECOMP/DISCECTOMY FUSION  1980s   CARPAL TUNNEL RELEASE Bilateral 2000   CESAREAN SECTION  x2 last one [place], URETEROSCOPY AND STENT PLACEMENT Left 01/11/2018   Procedure: CYSTOSCOPY, URETEROSCOPY AND STENT PLACEMENT;  Surgeon: Lucas Mallow, MD;  Location: [facility];  Service: Urology;  Laterality: Left;   CYSTOSCOPY/URETEROSCOPY/HOLMIUM LASER/STENT PLACEMENT Left 07/31/2018   Procedure: CYSTOSCOPY/URETEROSCOPY/HOLMIUM LASER/STENT PLACEMENT;  Surgeon: Lucas Mallow, MD;  Location: [facility];  Service: Urology;  Laterality: Left;   EXTRACORPOREAL SHOCK WAVE LITHOTRIPSY Left 12/05/2017   Procedure: LEFT EXTRACORPOREAL SHOCK WAVE LITHOTRIPSY (ESWL);  Surgeon: Franchot Gallo, MD;  Location: [facility];  Service: Urology;  Laterality: Left;   EXTRACORPOREAL SHOCK WAVE LITHOTRIPSY  1990s   FRACTURE SURGERY     HOLMIUM LASER APPLICATION Left 16/08/3708   Procedure: HOLMIUM LASER APPLICATION;  Surgeon: Lucas Mallow, MD;  Location: [facility];  Service: Urology;  Laterality: Left;   JOINT REPLACEMENT     MOUTH SURGERY     all teeth pulled   SPINE SURGERY     TUBAL LIGATION     Family History  Problem Relation Age of Onset   Heart disease Mother    Arthritis Mother    Heart disease Father        heart attack   Heart disease Sister    Diabetes Brother    Depression Brother    Drug abuse Brother    Early death Brother    Diabetes Brother        sepsis  Heart disease Brother    Breast cancer Neg Hx    Social History   Socioeconomic History   Marital status: Widowed    Spouse name: Not on file   Number of children: 2   Years of education: 12th Grade   Highest education level: High school graduate  Occupational History   Occupation: retired  Tobacco Use   Smoking status: Former    Packs/day: 1.00    Years: 44.00    Total pack years: 44.00    Types: Cigarettes    Quit date: 10/09/2012    Years since quitting: 9.0   Smokeless tobacco: Never   Tobacco comments:    since age 81  Vaping Use   Vaping Use: Never used  Substance and Sexual Activity   Alcohol use: No   Drug use: No    Comment: per pt last used "pot" 1970s   Sexual activity: Not Currently    Birth control/protection: Post-menopausal  Other Topics Concern   Not on file  Social History Narrative   Son lives with her   Social Determinants of Health   Financial Resource Strain: Medium Risk (10/30/2021)   Overall Financial Resource Strain (CARDIA)    Difficulty of Paying Living Expenses: Somewhat hard  Food Insecurity: Food Insecurity Present (10/30/2021)   Hunger Vital Sign    Worried About [place] in the Last Year: Sometimes true    Ran Out of Food in the Last  Year: Sometimes true  Transportation Needs: No Transportation Needs (10/30/2021)   PRAPARE - Hydrologist (Medical): No    Lack of Transportation (Non-Medical): No  Physical Activity: Inactive (10/30/2021)   Exercise Vital Sign    Days of Exercise per Week: 0 days    Minutes of Exercise per Session: 0 min  Stress: No Stress Concern Present (10/30/2021)   [place]    Feeling of Stress : Only a little  Social Connections: Socially Isolated (10/30/2021)   Social Connection and Isolation Panel [NHANES]    Frequency of Communication with Friends and Family: Never    Frequency of Social Gatherings with Friends and Family: Never    Attends Religious Services: Never    Marine scientist or Organizations: No    Attends Archivist Meetings: Never    Marital Status: Widowed    Tobacco Counseling Counseling given: Not Answered Tobacco comments: since age 74   Clinical Intake:  Pre-visit preparation completed: Yes  Pain : No/denies pain     BMI - recorded: 34.69 Nutritional Status: BMI > 30  Obese Nutritional Risks: None Diabetes: No  How often do you need to have someone help you when you read instructions, pamphlets, or other written materials from your doctor or pharmacy?: 1 - Never  Diabetic? no  Interpreter Needed?: No  Information entered by :: Boots Mcglown, LPN   Activities of Daily Living    10/30/2021    8:54 AM 10/28/2021    7:57 AM  In your present state of health, do you have any difficulty performing the following activities:  Hearing? 0 0  Vision? 0 0  Difficulty concentrating or making decisions? 0 0  Walking or climbing stairs? 0 0  Dressing or bathing? 0 0  Doing errands, shopping? 0 0  Preparing Food and eating ? N N  Using the Toilet? N N  In the past six months, have you accidently leaked urine? N N  Do you have problems with loss of bowel  control? N N  Managing your Medications? N N  Managing your Finances? N N  Housekeeping or managing your Housekeeping? N N    Patient Care Team: Gwenlyn Perking, FNP as PCP - General (Family Medicine) Harlen Labs, MD as Referring Physician (Optometry) Lucas Mallow, MD as Consulting Physician (Urology) Lavera Guise, [facility] as Pharmacist (Family Medicine)  Indicate any recent Medical Services you may have received from other than [facility] providers in the past year (date may be approximate).     Assessment:   This is a routine wellness examination for [facility].  Hearing/Vision screen Hearing Screening - Comments:: Denies hearing difficulties   Vision Screening - Comments:: Wears rx glasses - up to date with routine eye exams with [facility]  Dietary issues and exercise activities discussed: Current Exercise Habits: The patient does not participate in regular exercise at present, Exercise limited by: orthopedic condition(s);respiratory conditions(s)   Goals Addressed             This Visit's Progress    Increase physical activity   Not on track    Try to sign up for Silver Sneakers, get more active socially and physically     [place]   On track      Depression Screen    10/30/2021    9:01 AM 10/28/2021    3:22 PM 04/07/2021    2:00 PM 02/24/2021    2:07 PM 11/21/2020    2:42 PM 10/29/2020    8:27 AM 03/13/2020    8:36 AM  PHQ 2/9 Scores  PHQ - 2 Score 2 2 0 1 1 0 0  PHQ- 9 Score '8 8 5 6 3  '$ 0    Fall Risk    10/30/2021    8:34 AM 10/28/2021    3:22 PM 10/28/2021    7:57 AM 04/07/2021    2:00 PM 11/21/2020    2:42 PM  Fall Risk   Falls in the past year? '1 1 1 1 1  '$ Comment no falls in 6 months - balance is much better now      Number falls in past yr: 1 1 0 1 0  Injury with Fall? 0 0 0 0 0  Risk for fall due to : History of fall(s);Impaired balance/gait;Orthopedic patient History of fall(s)     Follow up Education  provided;Falls prevention discussed Falls evaluation completed  Falls prevention discussed Falls prevention discussed    FALL RISK PREVENTION PERTAINING TO THE HOME:  Any stairs in or around the home? Yes  If so, are there any without handrails? No  Home free of loose throw rugs in walkways, pet beds, electrical cords, etc? Yes  Adequate lighting in your home to reduce risk of falls? Yes   ASSISTIVE DEVICES UTILIZED TO PREVENT FALLS:  Life alert? No  Use of a cane, walker or w/c? Yes  Grab bars in the bathroom? No  Shower chair or bench in shower? Yes  Elevated toilet seat or a handicapped toilet? No   TIMED UP AND GO:  Was the test performed? No . Telephonic visit  Cognitive Function:    10/25/2017    8:26 AM  MMSE - Mini Mental State Exam  Orientation to time 5  Orientation to Place 5  Registration 3  Attention/ Calculation 5  Recall 3  Language- name 2 objects 2  Language- repeat 1  Language-  follow 3 step command 3  Language- read & follow direction 1  Write a sentence 1  Copy design 1  Total score 30        10/30/2021    8:48 AM 10/29/2019    8:51 AM 10/27/2018    8:42 AM  6CIT Screen  What Year? 0 points 0 points 0 points  What month? 0 points 0 points 0 points  What time? 0 points 0 points 0 points  Count back from 20 0 points 0 points 0 points  Months in reverse 0 points 0 points 0 points  Repeat phrase 0 points 0 points 6 points  Total Score 0 points 0 points 6 points    Immunizations Immunization History  Administered Date(s) Administered   Fluad Quad(high Dose 65+) 03/14/2020, 11/21/2020, 10/28/2021   Moderna Sars-Covid-2 Vaccination 07/18/2019   PFIZER(Purple Top)SARS-COV-2 Vaccination 05/31/2019, 06/22/2019   PNEUMOCOCCAL CONJUGATE-20 11/21/2020   Pneumococcal Conjugate-13 10/25/2017   Tdap 02/05/2013   Zoster Recombinat (Shingrix) 04/07/2021, 10/28/2021    TDAP status: Up to date  Flu Vaccine status: Up to date  Pneumococcal vaccine  status: Up to date  Covid-19 vaccine status: Completed vaccines  Qualifies for Shingles Vaccine? Yes   Zostavax completed Yes   Shingrix Completed?: Yes  Screening Tests Health Maintenance  Topic Date Due   Fecal DNA (Cologuard)  Never done   COVID-19 Vaccine (4 - Pfizer series) 11/13/2021 (Originally 09/12/2019)   DEXA SCAN  11/13/2021   TETANUS/TDAP  02/06/2023   MAMMOGRAM  03/24/2023   Pneumonia Vaccine 80+ Years old  Completed   INFLUENZA VACCINE  Completed   Hepatitis C Screening  Completed   Zoster Vaccines- Shingrix  Completed   HPV VACCINES  Aged Out    Health Maintenance  Health Maintenance Due  Topic Date Due   Fecal DNA (Cologuard)  Never done    Patient was advised to return Cologuard test soon  Mammogram status: Completed 03/23/2021. Repeat every year  Bone Density status: Completed 11/14/2019. Results reflect: Bone density results: OSTEOPENIA. Repeat every 2 years.  Lung Cancer Screening: (Low Dose CT Chest recommended if Age 49-80 years, 30 pack-year currently smoking OR have quit w/in 15years.) does qualify.   Lung Cancer Screening Referral: discussed - she has had one in the last year and wants to continue annually  Additional Screening:  Hepatitis C Screening: does qualify; Completed 10/10/2015  Vision Screening: Recommended annual ophthalmology exams for early detection of glaucoma and other disorders of the eye. Is the patient up to date with their annual eye exam?  Yes  Who is the provider or what is the name of the office in which the patient attends annual eye exams? [facility] If pt is not established with a provider, would they like to be referred to a provider to establish care? No .   Dental Screening: Recommended annual dental exams for proper oral hygiene  Community Resource Referral / Chronic Care Management: CRR required this visit?  No   CCM required this visit?  No      Plan:     I have personally reviewed and  noted the following in the patient's chart:   Medical and social history Use of alcohol, tobacco or illicit drugs  Current medications and supplements including opioid prescriptions. Patient is not currently taking opioid prescriptions. Functional ability and status Nutritional status Physical activity Advanced directives List of other physicians Hospitalizations, surgeries, and ER visits in previous 12 months Vitals Screenings to include cognitive,  depression, and falls Referrals and appointments  In addition, I have reviewed and discussed with patient certain preventive protocols, quality metrics, and best practice recommendations. A written personalized care plan for preventive services as well as general preventive health recommendations were provided to patient.     Sandrea Hammond, LPN   0/74/6002   Nurse Notes: She prefers Symbicort over [place] - couldn't afford, but it does work better -she still has some left and tries to save it for when her asthma flares up. CRR sent to help patient sign up for social interaction, exercise program, and food assistance.

## 2021-10-30 NOTE — Telephone Encounter (Signed)
   Telephone encounter was:  Unsuccessful.  10/30/2021 Name: Joyce Ross MRN: [id number] DOB: 09-07-1950  Unsuccessful outbound call made today to assist with:  Food Insecurity and silver sneakers  Outreach Attempt:  1st Attempt  A HIPAA compliant voice message was left requesting a return call.  Instructed patient to call back at  earliest convenience  .    [place], Care Management  [phone] [address], [place], [place] [address] Phone: [phone] Email: [email or web address]

## 2021-11-02 ENCOUNTER — Telehealth: Payer: Self-pay

## 2021-11-02 NOTE — Telephone Encounter (Signed)
   Telephone encounter was:  Unsuccessful.  11/02/2021 Name: Joyce Ross MRN: [id number] DOB: May 22, 1950  Unsuccessful outbound call made today to assist with:  Food Insecurity and silver sneakers  Outreach Attempt:  2nd Attempt  A HIPAA compliant voice message was left requesting a return call.  Instructed patient to call back at  earliest convenience .    [place], Care Management  [phone] [address], [place], [place] [address] Phone: [phone] Email: [email or web address]

## 2021-11-03 ENCOUNTER — Telehealth: Payer: Self-pay

## 2021-11-03 NOTE — Telephone Encounter (Signed)
   Telephone encounter was:  Unsuccessful.  11/03/2021 Name: Joyce Ross MRN: [id number] DOB: Dec 07, 1950  Unsuccessful outbound call made today to assist with:  Food Insecurity  Outreach Attempt:  3rd Attempt.  Referral closed unable to contact patient.  A HIPAA compliant voice message was left requesting a return call.  Instructed patient to call back at  earliest convenience .    [place], Care Management  [phone] [address], [place], [place] [address] Phone: [phone] Email: [email or web address]

## 2021-12-04 ENCOUNTER — Telehealth: Payer: Self-pay

## 2021-12-04 NOTE — Telephone Encounter (Signed)
Received notification from [employer] regarding RE-ENROLLMENT approval for BREZTRI. Patient assistance approved from 02/15/22 to 02/15/23.  Phone: [phone]  

## 2021-12-24 NOTE — Telephone Encounter (Signed)
Disregard previous note - patient eligible for 2024 re-enrollment.

## 2021-12-29 ENCOUNTER — Ambulatory Visit (INDEPENDENT_AMBULATORY_CARE_PROVIDER_SITE_OTHER): Payer: Medicare Other | Admitting: Family Medicine

## 2021-12-29 ENCOUNTER — Encounter: Payer: Self-pay | Admitting: Family Medicine

## 2021-12-29 VITALS — BP 131/46 | HR 87 | Temp 98.1°F | Ht <= 58 in | Wt 170.4 lb

## 2021-12-29 DIAGNOSIS — Z23 Encounter for immunization: Secondary | ICD-10-CM

## 2021-12-29 DIAGNOSIS — T148XXA Other injury of unspecified body region, initial encounter: Secondary | ICD-10-CM

## 2021-12-29 DIAGNOSIS — S61432A Puncture wound without foreign body of left hand, initial encounter: Secondary | ICD-10-CM

## 2021-12-29 NOTE — Progress Notes (Signed)
Subjective:  [name], female    DOB: 31-Jul-1950, 71 y.o.   MRN: [id number]  Patient Care Team: Gwenlyn Perking, FNP as PCP - General (Family Medicine) Harlen Labs, MD as Referring Physician (Optometry) Lucas Mallow, MD as Consulting Physician (Urology) Lavera Guise, [facility] as Pharmacist (Family Medicine)   Chief Complaint:  nail in hand (Left hand x x 3 days )   HPI: Joyce Ross is a 71 y.o. female presenting on 12/29/2021 for nail in hand (Left hand x x 3 days )   Pt presents today for evaluation of a puncture wound to her left palm from an old nail. States she was trying to hand a picture and the nail punctured her palm. She has been washing with soap and water. No drainage, pain, or erythema. Tetanus needs to be updated.      Relevant past medical, surgical, family, and social history reviewed and updated as indicated.  Allergies and medications reviewed and updated. Data reviewed: Chart in Epic.   Past Medical History:  Diagnosis Date   Allergy    Anxiety    Aortic atherosclerosis ([place]) 04/26/2021   Cataract    Coronary artery calcification seen on CAT scan 09/28/2017   Depression    Full dentures    GERD (gastroesophageal reflux disease)    Heart murmur    found a couple of years ago no problems   History of kidney stones    History of recurrent UTIs    Hyperlipidemia    Left-sided Bell's palsy 1990s   residual left facial drooping   Mild intermittent asthma    followed by pcp   Numbness and tingling in left arm    residual from cervical neck surgery going from shoulder down to hand, not completely   OA (osteoarthritis)    hands, spine, hips, right leg   Osteopenia    Peripheral neuropathy    feet   PONV (postoperative nausea and vomiting)    Pyelonephritis 09/20/2020   Renal artery stenosis (HCC) 04/26/2021   mild stenosis of left renal artery on CT Chest   Sleep apnea    Stage 3a chronic kidney disease ([place])  10/30/2021   Thoracic ascending aortic aneurysm ([place])    CT 09-28-2017--- 4.3 cm. CT 04-26-2021 --- 4.1 cm.   Thyroid nodule    ultrasound w/ bx 09/ 2019,  per pt was benign   White coat syndrome without hypertension     Past Surgical History:  Procedure Laterality Date   ANTERIOR CERVICAL DECOMP/DISCECTOMY FUSION  1980s   CARPAL TUNNEL RELEASE Bilateral 2000   CESAREAN SECTION  x2 last one [place], URETEROSCOPY AND STENT PLACEMENT Left 01/11/2018   Procedure: CYSTOSCOPY, URETEROSCOPY AND STENT PLACEMENT;  Surgeon: Lucas Mallow, MD;  Location: [facility];  Service: Urology;  Laterality: Left;   CYSTOSCOPY/URETEROSCOPY/HOLMIUM LASER/STENT PLACEMENT Left 07/31/2018   Procedure: CYSTOSCOPY/URETEROSCOPY/HOLMIUM LASER/STENT PLACEMENT;  Surgeon: Lucas Mallow, MD;  Location: [facility];  Service: Urology;  Laterality: Left;   EXTRACORPOREAL SHOCK WAVE LITHOTRIPSY Left 12/05/2017   Procedure: LEFT EXTRACORPOREAL SHOCK WAVE LITHOTRIPSY (ESWL);  Surgeon: Franchot Gallo, MD;  Location: [facility];  Service: Urology;  Laterality: Left;   EXTRACORPOREAL SHOCK WAVE LITHOTRIPSY  1990s   FRACTURE SURGERY     HOLMIUM LASER APPLICATION Left [id number]/7342   Procedure: HOLMIUM LASER APPLICATION;  Surgeon: Lucas Mallow, MD;  Location: [facility];  Service: Urology;  Laterality: Left;   JOINT REPLACEMENT     MOUTH SURGERY     all teeth pulled   SPINE SURGERY     TUBAL LIGATION      Social History   Socioeconomic History   Marital status: Widowed    Spouse name: Not on file   Number of children: 2   Years of education: 12th Grade   Highest education level: High school graduate  Occupational History   Occupation: retired  Tobacco Use   Smoking status: Former    Packs/day: 1.00    Years: 44.00    Total pack years: 44.00    Types: Cigarettes    Quit date: 10/09/2012    Years since quitting: 9.2   Smokeless tobacco: Never   Tobacco comments:    since age 75   Vaping Use   Vaping Use: Never used  Substance and Sexual Activity   Alcohol use: No   Drug use: No    Comment: per pt last used "pot" 1970s   Sexual activity: Not Currently    Birth control/protection: Post-menopausal  Other Topics Concern   Not on file  Social History Narrative   Son lives with her   Social Determinants of Health   Financial Resource Strain: Medium Risk (10/30/2021)   Overall Financial Resource Strain (CARDIA)    Difficulty of Paying Living Expenses: Somewhat hard  Food Insecurity: Food Insecurity Present (10/30/2021)   Hunger Vital Sign    Worried About [place] in the Last Year: Sometimes true    Ran Out of Food in the Last Year: Sometimes true  Transportation Needs: No Transportation Needs (10/30/2021)   PRAPARE - Hydrologist (Medical): No    Lack of Transportation (Non-Medical): No  Physical Activity: Inactive (10/30/2021)   Exercise Vital Sign    Days of Exercise per Week: 0 days    Minutes of Exercise per Session: 0 min  Stress: No Stress Concern Present (10/30/2021)   [place]    Feeling of Stress : Only a little  Social Connections: Socially Isolated (10/30/2021)   Social Connection and Isolation Panel [NHANES]    Frequency of Communication with Friends and Family: Never    Frequency of Social Gatherings with Friends and Family: Never    Attends Religious Services: Never    Marine scientist or Organizations: No    Attends Archivist Meetings: Never    Marital Status: Widowed  Intimate Partner Violence: Not At Risk (10/30/2021)   Humiliation, Afraid, Rape, and Kick questionnaire    Fear of Current or Ex-Partner: No    Emotionally Abused: No    Physically Abused: No    Sexually Abused: No    Outpatient Encounter Medications as of 12/29/2021  Medication Sig   acetaminophen (TYLENOL) 325 MG tablet Take 325 mg by mouth every 6  (six) hours as needed for moderate pain.    albuterol (PROVENTIL HFA;VENTOLIN HFA) 108 (90 Base) MCG/ACT inhaler Inhale 1-2 puffs into the lungs every 6 (six) hours as needed for wheezing or shortness of breath.   aspirin EC 81 MG tablet Take 81 mg by mouth daily. Swallow whole.   atorvastatin (LIPITOR) 40 MG tablet Take 1 tablet (40 mg total) by mouth daily.   Budeson-Glycopyrrol-Formoterol (BREZTRI AEROSPHERE) 160-9-4.8 MCG/ACT AERO Inhale 2 puffs into the lungs 2 (two) times daily.   busPIRone (BUSPAR) 15 MG tablet Take 1 tablet (  15 mg total) by mouth 2 (two) times daily.   CALCIUM PO Take by mouth daily.   Cholecalciferol (VITAMIN D-3) 125 MCG (5000 UT) TABS Take by mouth.   cyclobenzaprine (FLEXERIL) 10 MG tablet TAKE ONE TABLET BY MOUTH THREE TIMES A DAY AS NEEDED FOR MUSCLE SPASMS   FLUoxetine (PROZAC) 20 MG capsule Take 1 capsule (20 mg total) by mouth daily.   fluticasone (FLONASE) 50 MCG/ACT nasal spray Place 2 sprays into both nostrils daily.   gabapentin (NEURONTIN) 300 MG capsule Take 1 capsule (300 mg total) by mouth 3 (three) times daily.   levocetirizine (XYZAL) 5 MG tablet Take 1 tablet (5 mg total) by mouth every evening.   Melatonin 1 MG TABS Take 1 mg by mouth at bedtime as needed (sleep).    Olopatadine HCl 0.2 % SOLN Apply 1 drop to eye daily.   ondansetron (ZOFRAN ODT) 4 MG disintegrating tablet Take 1 tablet (4 mg total) by mouth every 8 (eight) hours as needed for nausea or vomiting.   pantoprazole (PROTONIX) 20 MG tablet Take 1 tablet (20 mg total) by mouth daily.   No facility-administered encounter medications on file as of 12/29/2021.    Allergies  Allergen Reactions   Adhesive [Tape] Other (See Comments)    "leaves red places on skin"   Bee Pollen    Metoprolol     nightmares   Penicillins Other (See Comments)    Thrush Has patient had a PCN reaction causing immediate rash, facial/tongue/throat swelling, SOB or lightheadedness with hypotension: No Has  patient had a PCN reaction causing severe rash involving mucus membranes or skin necrosis: No Has patient had a PCN reaction that required hospitalization: No Has patient had a PCN reaction occurring within the last 10 years: Yes If all of the above answers are "NO", then may proceed with Cephalosporin use.    Radish [Radish (Raphanus Sativus)] Hives   Yellow Jacket Venom Itching and Rash    Review of Systems  Constitutional:  Negative for activity change, appetite change, chills, diaphoresis, fatigue, fever and unexpected weight change.  HENT:  Positive for congestion, postnasal drip and sinus pressure.   Eyes: Negative.   Respiratory:  Positive for wheezing. Negative for cough, chest tightness and shortness of breath.   Cardiovascular:  Negative for chest pain, palpitations and leg swelling.  Gastrointestinal:  Negative for abdominal pain, blood in stool, constipation, diarrhea, nausea and vomiting.  Endocrine: Negative.   Genitourinary:  Negative for decreased urine volume, difficulty urinating, dysuria, frequency and urgency.  Musculoskeletal:  Negative for arthralgias and myalgias.  Skin:  Positive for wound. Negative for color change, pallor and rash.  Allergic/Immunologic: Negative.   Neurological:  Negative for dizziness, weakness and headaches.  Hematological: Negative.   Psychiatric/Behavioral:  Negative for confusion, hallucinations, sleep disturbance and suicidal ideas.   All other systems reviewed and are negative.       Objective:  BP (!) 131/46   Pulse 87   Temp 98.1 F (36.7 C) (Temporal)   Ht _0  (1.473 m)   Wt 170 lb 6.4 oz (77.3 kg)   SpO2 95%   BMI 35.61 kg/m    Wt Readings from Last 3 Encounters:  12/29/21 170 lb 6.4 oz (77.3 kg)  10/30/21 166 lb (75.3 kg)  10/28/21 166 lb (75.3 kg)    Physical Exam Vitals and nursing note reviewed.  Constitutional:      General: She is not in acute distress.    Appearance: Normal appearance. She  is obese. She  is not ill-appearing, toxic-appearing or diaphoretic.  HENT:     Head: Normocephalic and atraumatic.     Nose: Congestion present.     Mouth/Throat:     Mouth: Mucous membranes are moist.     Pharynx: Oropharynx is clear.  Eyes:     Conjunctiva/sclera: Conjunctivae normal.     Pupils: Pupils are equal, round, and reactive to light.  Cardiovascular:     Rate and Rhythm: Normal rate and regular rhythm.     Heart sounds: Normal heart sounds.  Pulmonary:     Effort: Pulmonary effort is normal. No respiratory distress.     Breath sounds: No stridor. Wheezing (expiratory) present. No rhonchi or rales.  Chest:     Chest wall: No tenderness.  Skin:    General: Skin is warm and dry.     Capillary Refill: Capillary refill takes less than 2 seconds.     Findings: Wound: puncture wound to left palm, no erythema or drainage.  Neurological:     General: No focal deficit present.     Mental Status: She is alert and oriented to person, place, and time.  Psychiatric:        Mood and Affect: Mood normal.        Behavior: Behavior normal.        Thought Content: Thought content normal.        Judgment: Judgment normal.     Results for orders placed or performed in visit on 10/28/21  CMP14+EGFR  Result Value Ref Range   Glucose 74 70 - 99 mg/dL   BUN 27 8 - 27 mg/dL   Creatinine, Ser 1.17 (H) 0.57 - 1.00 mg/dL   eGFR 50 (L) >59 mL/min/1.73   BUN/Creatinine Ratio 23 12 - 28   Sodium 142 134 - 144 mmol/L   Potassium 5.0 3.5 - 5.2 mmol/L   Chloride 104 96 - 106 mmol/L   CO2 24 20 - 29 mmol/L   Calcium 9.8 8.7 - 10.3 mg/dL   Total Protein 7.0 6.0 - 8.5 g/dL   Albumin 4.4 3.9 - 4.9 g/dL   Globulin, Total 2.6 1.5 - 4.5 g/dL   Albumin/Globulin Ratio 1.7 1.2 - 2.2   Bilirubin Total 0.2 0.0 - 1.2 mg/dL   Alkaline Phosphatase 109 44 - 121 IU/L   AST 22 0 - 40 IU/L   ALT 18 0 - 32 IU/L  Lipid panel  Result Value Ref Range   Cholesterol, Total 218 (H) 100 - 199 mg/dL   Triglycerides 135 0 -  149 mg/dL   HDL 45 >39 mg/dL   VLDL Cholesterol Cal 24 5 - 40 mg/dL   LDL Chol Calc (NIH) 149 (H) 0 - 99 mg/dL   Chol/HDL Ratio 4.8 (H) 0.0 - 4.4 ratio  Bayer DCA Hb A1c Waived  Result Value Ref Range   HB A1C (BAYER DCA - WAIVED) 5.7 (H) 4.8 - 5.6 %  TSH  Result Value Ref Range   TSH 0.772 0.450 - 4.500 uIU/mL  Magnesium  Result Value Ref Range   Magnesium 2.1 1.6 - 2.3 mg/dL  Anemia Profile B  Result Value Ref Range   Total Iron Binding Capacity 320 250 - 450 ug/dL   UIBC 267 118 - 369 ug/dL   Iron 53 27 - 139 ug/dL   Iron Saturation 17 15 - 55 %   Ferritin 200 (H) 15 - 150 ng/mL   Vitamin B-12 598 232 - 1,245 pg/mL  Folate 10.2 >3.0 ng/mL   WBC 9.9 3.4 - 10.8 x10E3/uL   RBC 4.51 3.77 - 5.28 x10E6/uL   Hemoglobin 13.7 11.1 - 15.9 g/dL   Hematocrit 41.4 34.0 - 46.6 %   MCV 92 79 - 97 fL   MCH 30.4 26.6 - 33.0 pg   MCHC 33.1 31.5 - 35.7 g/dL   RDW 12.5 11.7 - 15.4 %   Platelets 320 150 - 450 x10E3/uL   Neutrophils 68 Not Estab. %   Lymphs 19 Not Estab. %   Monocytes 8 Not Estab. %   Eos 4 Not Estab. %   Basos 1 Not Estab. %   Neutrophils Absolute 6.7 1.4 - 7.0 x10E3/uL   Lymphocytes Absolute 1.9 0.7 - 3.1 x10E3/uL   Monocytes Absolute 0.8 0.1 - 0.9 x10E3/uL   EOS (ABSOLUTE) 0.4 0.0 - 0.4 x10E3/uL   Basophils Absolute 0.1 0.0 - 0.2 x10E3/uL   Immature Granulocytes 0 Not Estab. %   Immature Grans (Abs) 0.0 0.0 - 0.1 x10E3/uL   Retic Ct Pct 1.7 0.6 - 2.6 %       Pertinent labs & imaging results that were available during my care of the patient were reviewed by me and considered in my medical decision making.  Assessment & Plan:  Joyce Ross was seen today for nail in hand.  Diagnoses and all orders for this visit:  Puncture wound Need for tetanus booster Wound care discussed in detail. Tetanus updated.  -     Td vaccine greater than or equal to 7yo preservative free IM     Continue all other maintenance medications.  Follow up plan: Return if symptoms  worsen or fail to improve.   Continue healthy lifestyle choices, including diet (rich in fruits, vegetables, and lean proteins, and low in salt and simple carbohydrates) and exercise (at least 30 minutes of moderate physical activity daily).  Educational handout given for wound care, Td vaccination information  The above assessment and management plan was discussed with the patient. The patient verbalized understanding of and has agreed to the management plan. Patient is aware to call the clinic if they develop any new symptoms or if symptoms persist or worsen. Patient is aware when to return to the clinic for a follow-up visit. Patient educated on when it is appropriate to go to the emergency department.   Monia Pouch, FNP-C [facility] [phone]

## 2022-02-26 ENCOUNTER — Encounter: Payer: Self-pay | Admitting: Family Medicine

## 2022-02-26 ENCOUNTER — Ambulatory Visit (INDEPENDENT_AMBULATORY_CARE_PROVIDER_SITE_OTHER): Payer: Medicare HMO

## 2022-02-26 ENCOUNTER — Ambulatory Visit: Payer: Medicare HMO | Admitting: Family Medicine

## 2022-02-26 VITALS — BP 124/57 | HR 73 | Temp 98.0°F | Ht <= 58 in | Wt 167.4 lb

## 2022-02-26 DIAGNOSIS — M8588 Other specified disorders of bone density and structure, other site: Secondary | ICD-10-CM | POA: Diagnosis not present

## 2022-02-26 DIAGNOSIS — M8589 Other specified disorders of bone density and structure, multiple sites: Secondary | ICD-10-CM

## 2022-02-26 DIAGNOSIS — F331 Major depressive disorder, recurrent, moderate: Secondary | ICD-10-CM

## 2022-02-26 DIAGNOSIS — M62838 Other muscle spasm: Secondary | ICD-10-CM | POA: Insufficient documentation

## 2022-02-26 DIAGNOSIS — E6609 Other obesity due to excess calories: Secondary | ICD-10-CM | POA: Insufficient documentation

## 2022-02-26 DIAGNOSIS — E782 Mixed hyperlipidemia: Secondary | ICD-10-CM

## 2022-02-26 DIAGNOSIS — G629 Polyneuropathy, unspecified: Secondary | ICD-10-CM

## 2022-02-26 DIAGNOSIS — E041 Nontoxic single thyroid nodule: Secondary | ICD-10-CM

## 2022-02-26 DIAGNOSIS — K219 Gastro-esophageal reflux disease without esophagitis: Secondary | ICD-10-CM

## 2022-02-26 DIAGNOSIS — J4540 Moderate persistent asthma, uncomplicated: Secondary | ICD-10-CM

## 2022-02-26 DIAGNOSIS — N1831 Chronic kidney disease, stage 3a: Secondary | ICD-10-CM | POA: Diagnosis not present

## 2022-02-26 DIAGNOSIS — Z6834 Body mass index (BMI) 34.0-34.9, adult: Secondary | ICD-10-CM

## 2022-02-26 DIAGNOSIS — R7303 Prediabetes: Secondary | ICD-10-CM | POA: Diagnosis not present

## 2022-02-26 DIAGNOSIS — Z87891 Personal history of nicotine dependence: Secondary | ICD-10-CM

## 2022-02-26 DIAGNOSIS — M810 Age-related osteoporosis without current pathological fracture: Secondary | ICD-10-CM | POA: Diagnosis not present

## 2022-02-26 DIAGNOSIS — F411 Generalized anxiety disorder: Secondary | ICD-10-CM

## 2022-02-26 LAB — BAYER DCA HB A1C WAIVED: HB A1C (BAYER DCA - WAIVED): 5.7 % — ABNORMAL HIGH (ref 4.8–5.6)

## 2022-02-26 MED ORDER — FLUOXETINE HCL 20 MG PO CAPS: 20.0000 mg | ORAL_CAPSULE | Freq: Every day | ORAL | 3 refills | Status: DC

## 2022-02-26 MED ORDER — BUSPIRONE HCL 15 MG PO TABS: 15.0000 mg | ORAL_TABLET | Freq: Two times a day (BID) | ORAL | 3 refills | Status: DC

## 2022-02-26 MED ORDER — BREZTRI AEROSPHERE 160-9-4.8 MCG/ACT IN AERO
2.0000 | INHALATION_SPRAY | Freq: Two times a day (BID) | RESPIRATORY_TRACT | 11 refills | Status: DC
Start: 2022-02-26 — End: 2022-04-16

## 2022-02-26 MED ORDER — PANTOPRAZOLE SODIUM 20 MG PO TBEC: 20.0000 mg | DELAYED_RELEASE_TABLET | Freq: Every day | ORAL | 3 refills | Status: DC

## 2022-02-26 MED ORDER — CYCLOBENZAPRINE HCL 10 MG PO TABS: ORAL_TABLET | ORAL | 2 refills | Status: DC

## 2022-02-26 NOTE — Progress Notes (Signed)
Established Patient Office Visit  Subjective   [name], female    DOB: 1950/11/17  Age: 72 y.o. MRN: [id number]  Chief Complaint  Patient presents with   Medical Management of Chronic Issues   Hyperlipidemia   Depression   Chronic Kidney Disease   Prediabetes    HPI  Prediabetes This has been stable for years. Last A1c was 5.7. She tries to follow a low carb, healthy diet. She also tried to exercise but is limited due to chronic pain from OA.   2.  HLD She was switched from pravastatin to atorvastatin at her last visit. She denies side effects from this.   3. Thyroid nodule Last [place] in March on 2023, recommended repeat in 1 year. Last TSH was normal.   4. Asthma Breztri BID. She uses her albuterol inhaler 1-2x a week usually.   5. Osteoporosis She is taking a vitamin D supplement. She is not currently taking a calcium supplement. She tries to eat a lot of calcium in her diet. She is due for her DEXA.   6. GERD Compliant with medications - Yes Current medications - protonix  Sore throat - No Voice change - No Hemoptysis - No Dysphagia or dyspepsia - No Water brash - No Red Flags (weight loss, hematochezia, melena, weight loss, early satiety, fevers, odynophagia, or persistent vomiting) - No  7. Chronic back pain She has chronic back pain with neuropathy. She takes gabapentin with good relief. She also has spasms at times and takes flexeril prn.    8. Depression/anxiety Currently on prozac 20 and buspar 15 BID. She does feel like this is helpful. She reports that current circumstances have increased her anxiety and depression symptoms. She does not desire a change in medications right now, but is interested in counseling.      02/26/2022    1:08 PM 10/30/2021    9:01 AM 10/28/2021    3:22 PM  Depression screen PHQ 2/9  Decreased Interest '2 1 1  '$ Down, Depressed, Hopeless '2 1 1  '$ PHQ - 2 Score '4 2 2  '$ Altered sleeping '2 1 1  '$ Tired, decreased  energy '3 1 1  '$ Change in appetite '3 2 2  '$ Feeling bad or failure about yourself  '2 1 1  '$ Trouble concentrating '1 1 1  '$ Moving slowly or fidgety/restless 0 0 0  Suicidal thoughts 0 0 0  PHQ-9 Score '15 8 8  '$ Difficult doing work/chores Very difficult Somewhat difficult Somewhat difficult      02/26/2022    1:08 PM 10/28/2021    3:23 PM 04/07/2021    2:01 PM 02/24/2021    2:12 PM  GAD 7 : Generalized Anxiety Score  Nervous, Anxious, on Edge '1 1 1 1  '$ Control/stop worrying '3 1 1 1  '$ Worry too much - different things '2 1 1 '$ 0  Trouble relaxing 2 1 0 0  Restless 2 1 0 0  Easily annoyed or irritable '3 1 1 1  '$ Afraid - awful might happen '3 1 1 '$ 0  Total GAD 7 Score '16 7 5 3  '$ Anxiety Difficulty Very difficult Somewhat difficult Somewhat difficult Not difficult at all     Past Medical History:  Diagnosis Date   Allergy    Anxiety    Aortic atherosclerosis ([place]) 04/26/2021   Cataract    Coronary artery calcification seen on CAT scan 09/28/2017   Depression    Full dentures    GERD (gastroesophageal reflux disease)  Heart murmur    found a couple of years ago no problems   History of kidney stones    History of recurrent UTIs    Hyperlipidemia    Left-sided Bell's palsy 1990s   residual left facial drooping   Mild intermittent asthma    followed by pcp   Numbness and tingling in left arm    residual from cervical neck surgery going from shoulder down to hand, not completely   OA (osteoarthritis)    hands, spine, hips, right leg   Osteopenia    Peripheral neuropathy    feet   PONV (postoperative nausea and vomiting)    Pyelonephritis 09/20/2020   Renal artery stenosis (HCC) 04/26/2021   mild stenosis of left renal artery on CT Chest   Sleep apnea    Stage 3a chronic kidney disease ([place]) 10/30/2021   Thoracic ascending aortic aneurysm ([place])    CT 09-28-2017--- 4.3 cm. CT 04-26-2021 --- 4.1 cm.   Thyroid nodule    ultrasound w/ bx 09/ 2019,  per pt was benign   White coat syndrome  without hypertension      ROS Negative unless specially indicated above in HPI.    Objective:     BP (!) 124/57   Pulse 73   Temp 98 F (36.7 C) (Temporal)   Ht '4\' 10"'$  (1.473 m)   Wt 167 lb 6 oz (75.9 kg)   SpO2 93%   BMI 34.98 kg/m  Wt Readings from Last 3 Encounters:  02/26/22 167 lb 6 oz (75.9 kg)  12/29/21 170 lb 6.4 oz (77.3 kg)  10/30/21 166 lb (75.3 kg)      Physical Exam Vitals and nursing note reviewed.  Constitutional:      General: She is not in acute distress.    Appearance: She is not ill-appearing, toxic-appearing or diaphoretic.  HENT:     Nose: Nose normal.     Mouth/Throat:     Mouth: Mucous membranes are moist.     Pharynx: Oropharynx is clear.  Neck:     Thyroid: No thyroid mass, thyromegaly or thyroid tenderness.  Cardiovascular:     Rate and Rhythm: Normal rate and regular rhythm.     Heart sounds: Normal heart sounds. No murmur heard. Pulmonary:     Effort: Pulmonary effort is normal. No respiratory distress.     Breath sounds: Normal breath sounds. No wheezing, rhonchi or rales.  Chest:     Chest wall: No tenderness.  Abdominal:     General: Bowel sounds are normal. There is no distension.     Palpations: Abdomen is soft.     Tenderness: There is no abdominal tenderness. There is no guarding or rebound.  Musculoskeletal:     Cervical back: No rigidity.     Right lower leg: No edema.     Left lower leg: No edema.  Skin:    General: Skin is warm and dry.  Neurological:     General: No focal deficit present.     Mental Status: She is alert and oriented to person, place, and time.  Psychiatric:        Mood and Affect: Mood normal.        Behavior: Behavior normal.        Thought Content: Thought content normal.        Judgment: Judgment normal.      No results found for any visits on 02/26/22.    The 10-year ASCVD risk score (Arnett DK, et  al., 2019) is: 10.5%    Assessment & Plan:   Ramiah was seen today for medical  management of chronic issues, hyperlipidemia, depression, chronic kidney disease and prediabetes.  Diagnoses and all orders for this visit:  Pre-diabetes Diet and exercise. Labs pending.  -     Bayer DCA Hb A1c Waived  Mixed hyperlipidemia On statin. Labs pending.  -     Lipid panel  Stage 3a chronic kidney disease ([place]) Labs pending.  -     CMP14+EGFR  Class 1 obesity due to excess calories with serious comorbidity and body mass index (BMI) of 34.0 to 34.9 in adult Diet and exercise.   Major depressive disorder, recurrent, moderate (HCC) Generalized anxiety disorder Uncontrolled. Denies SI. Declined changes in medications today. Continue current regimen. Referral for counseling today.  -     Ambulatory referral to Psychology -     busPIRone (BUSPAR) 15 MG tablet; Take 1 tablet (15 mg total) by mouth 2 (two) times daily. -     FLUoxetine (PROZAC) 20 MG capsule; Take 1 capsule (20 mg total) by mouth daily.  Gastroesophageal reflux disease, unspecified whether esophagitis present Well controlled on current regimen.  -     pantoprazole (PROTONIX) 20 MG tablet; Take 1 tablet (20 mg total) by mouth daily.  Neuropathy  Moderate persistent asthma without complication Well controlled on current regimen.  -     Budeson-Glycopyrrol-Formoterol (BREZTRI AEROSPHERE) 160-9-4.8 MCG/ACT AERO; Inhale 2 puffs into the lungs 2 (two) times daily.  Thyroid nodule Recent TSH was normal.  -     US THYROID; Future  Muscle spasm Well controlled on current regimen.  -     cyclobenzaprine (FLEXERIL) 10 MG tablet; TAKE ONE TABLET BY MOUTH THREE TIMES A DAY AS NEEDED FOR MUSCLE SPASMS  Osteopenia of multiple sites Discussed vitamin D and calcium intake.  -     DG [facility] DEXA; Future  Former tobacco use -     CT CHEST LUNG CA SCREEN LOW DOSE W/O CM; Future  Return in about 6 months (around 08/27/2022) for chronic follow up.   The patient indicates understanding of these issues and agrees with the  plan.  Gwenlyn Perking, FNP

## 2022-02-26 NOTE — Patient Instructions (Signed)
Os-cal supplement

## 2022-02-27 LAB — CMP14+EGFR
ALT: 15 IU/L (ref 0–32)
AST: 23 IU/L (ref 0–40)
Albumin/Globulin Ratio: 1.7 (ref 1.2–2.2)
Albumin: 4.1 g/dL (ref 3.8–4.8)
Alkaline Phosphatase: 120 IU/L (ref 44–121)
BUN/Creatinine Ratio: 19 (ref 12–28)
BUN: 22 mg/dL (ref 8–27)
Bilirubin Total: 0.3 mg/dL (ref 0.0–1.2)
CO2: 23 mmol/L (ref 20–29)
Calcium: 9 mg/dL (ref 8.7–10.3)
Chloride: 105 mmol/L (ref 96–106)
Creatinine, Ser: 1.15 mg/dL — ABNORMAL HIGH (ref 0.57–1.00)
Globulin, Total: 2.4 g/dL (ref 1.5–4.5)
Glucose: 129 mg/dL — ABNORMAL HIGH (ref 70–99)
Potassium: 4.4 mmol/L (ref 3.5–5.2)
Sodium: 143 mmol/L (ref 134–144)
Total Protein: 6.5 g/dL (ref 6.0–8.5)
eGFR: 51 mL/min/{1.73_m2} — ABNORMAL LOW (ref >59)

## 2022-02-27 LAB — LIPID PANEL
Chol/HDL Ratio: 2.7 ratio (ref 0.0–4.4)
Cholesterol, Total: 131 mg/dL (ref 100–199)
HDL: 49 mg/dL (ref >39)
LDL Chol Calc (NIH): 59 mg/dL (ref 0–99)
Triglycerides: 128 mg/dL (ref 0–149)
VLDL Cholesterol Cal: 23 mg/dL (ref 5–40)

## 2022-03-10 ENCOUNTER — Ambulatory Visit (HOSPITAL_COMMUNITY): Admission: RE | Admit: 2022-03-10 | Payer: Medicare HMO | Source: Ambulatory Visit

## 2022-03-18 ENCOUNTER — Other Ambulatory Visit: Payer: Self-pay | Admitting: Family Medicine

## 2022-03-18 DIAGNOSIS — M810 Age-related osteoporosis without current pathological fracture: Secondary | ICD-10-CM

## 2022-03-18 MED ORDER — ALENDRONATE SODIUM 70 MG PO TABS: 70.0000 mg | ORAL_TABLET | ORAL | 3 refills | Status: DC

## 2022-03-19 ENCOUNTER — Telehealth: Payer: Self-pay | Admitting: Family Medicine

## 2022-03-19 NOTE — Telephone Encounter (Signed)
Patient would like to speak to someone about DEXA scan. Please call back

## 2022-03-19 NOTE — Telephone Encounter (Signed)
Pt aware of DEXA results and recommendations and voiced understanding.

## 2022-04-12 ENCOUNTER — Other Ambulatory Visit: Payer: Self-pay | Admitting: Family Medicine

## 2022-04-12 DIAGNOSIS — J302 Other seasonal allergic rhinitis: Secondary | ICD-10-CM

## 2022-04-12 DIAGNOSIS — F411 Generalized anxiety disorder: Secondary | ICD-10-CM

## 2022-04-12 DIAGNOSIS — J4540 Moderate persistent asthma, uncomplicated: Secondary | ICD-10-CM

## 2022-04-12 DIAGNOSIS — G629 Polyneuropathy, unspecified: Secondary | ICD-10-CM

## 2022-04-12 DIAGNOSIS — I700 Atherosclerosis of aorta: Secondary | ICD-10-CM

## 2022-04-12 DIAGNOSIS — E782 Mixed hyperlipidemia: Secondary | ICD-10-CM

## 2022-04-12 DIAGNOSIS — M62838 Other muscle spasm: Secondary | ICD-10-CM

## 2022-04-12 DIAGNOSIS — F331 Major depressive disorder, recurrent, moderate: Secondary | ICD-10-CM

## 2022-04-12 DIAGNOSIS — K219 Gastro-esophageal reflux disease without esophagitis: Secondary | ICD-10-CM

## 2022-04-12 DIAGNOSIS — I7121 Aneurysm of the ascending aorta, without rupture: Secondary | ICD-10-CM

## 2022-04-12 NOTE — Telephone Encounter (Signed)
  Prescription Request  04/12/2022  Is this a "Controlled Substance" medicine? no  Have you seen your PCP in the last 2 weeks? No pt has appt in July with Tiffany  If YES, route message to pool  -  If NO, patient needs to be scheduled for appointment.  What is the name of the medication or equipment? albuterol (PROVENTIL HFA;VENTOLIN HFA) 108 (90 Base) MCG/ACT inhaler  atorvastatin (LIPITOR) 40 MG tablet  Budeson-Glycopyrrol-Formoterol (BREZTRI AEROSPHERE) 160-9-4.8 MCG/ACT AERO  busPIRone (BUSPAR) 15 MG tablet  cyclobenzaprine (FLEXERIL) 10 MG tablet  LUoxetine (PROZAC) 20 MG capsule  fluticasone (FLONASE) 50 MCG/ACT nasal spray  bapentin (NEURONTIN) 300 MG capsule  levocetirizine (XYZAL) 5 MG tablet  pantoprazole (PROTONIX) 20 MG tablet   Have you contacted your pharmacy to request a refill? No pt is switching ins and will be using new pharmacy effective 04/16/2022 please send then   Which pharmacy would you like this sent to? [facility] mail order [facility] [phone]   Patient notified that their request is being sent to the clinical staff for review and that they should receive a response within 2 business days.

## 2022-04-16 MED ORDER — BREZTRI AEROSPHERE 160-9-4.8 MCG/ACT IN AERO: 2.0000 | INHALATION_SPRAY | Freq: Two times a day (BID) | RESPIRATORY_TRACT | 0 refills | Status: DC

## 2022-04-16 MED ORDER — CYCLOBENZAPRINE HCL 10 MG PO TABS: ORAL_TABLET | ORAL | 3 refills | Status: DC

## 2022-04-16 MED ORDER — FLUOXETINE HCL 20 MG PO CAPS: 20.0000 mg | ORAL_CAPSULE | Freq: Every day | ORAL | 0 refills | Status: DC

## 2022-04-16 MED ORDER — BUSPIRONE HCL 15 MG PO TABS: 15.0000 mg | ORAL_TABLET | Freq: Two times a day (BID) | ORAL | 0 refills | Status: DC

## 2022-04-16 MED ORDER — ALBUTEROL SULFATE HFA 108 (90 BASE) MCG/ACT IN AERS: 1.0000 | INHALATION_SPRAY | Freq: Four times a day (QID) | RESPIRATORY_TRACT | 0 refills | Status: DC | PRN

## 2022-04-16 MED ORDER — PANTOPRAZOLE SODIUM 20 MG PO TBEC: 20.0000 mg | DELAYED_RELEASE_TABLET | Freq: Every day | ORAL | 2 refills | Status: DC

## 2022-04-16 MED ORDER — GABAPENTIN 300 MG PO CAPS: 300.0000 mg | ORAL_CAPSULE | Freq: Three times a day (TID) | ORAL | 0 refills | Status: DC

## 2022-04-16 MED ORDER — ATORVASTATIN CALCIUM 40 MG PO TABS: 40.0000 mg | ORAL_TABLET | Freq: Every day | ORAL | 0 refills | Status: DC

## 2022-04-16 MED ORDER — FLUTICASONE PROPIONATE 50 MCG/ACT NA SUSP: 2.0000 | Freq: Every day | NASAL | 1 refills | Status: DC

## 2022-04-16 MED ORDER — LEVOCETIRIZINE DIHYDROCHLORIDE 5 MG PO TABS: 5.0000 mg | ORAL_TABLET | Freq: Every evening | ORAL | 1 refills | Status: DC

## 2022-04-16 MED ORDER — BREZTRI AEROSPHERE 160-9-4.8 MCG/ACT IN AERO: 2.0000 | INHALATION_SPRAY | Freq: Two times a day (BID) | RESPIRATORY_TRACT | 0 refills | Status: AC

## 2022-04-16 NOTE — Telephone Encounter (Signed)
Prozac & Pantoprazole were RFd on 02/26/22 #90 w/ 3 RFs, remaining refills sent to [facility]. Could not find an Acupuncturist by the ph# given, when looking up pharmacy. [facility] mail order pharmacy which said permanently closed and then said [facility].

## 2022-05-02 ENCOUNTER — Other Ambulatory Visit: Payer: Self-pay | Admitting: Family Medicine

## 2022-05-02 DIAGNOSIS — I7121 Aneurysm of the ascending aorta, without rupture: Secondary | ICD-10-CM

## 2022-05-02 DIAGNOSIS — I700 Atherosclerosis of aorta: Secondary | ICD-10-CM

## 2022-05-02 DIAGNOSIS — E782 Mixed hyperlipidemia: Secondary | ICD-10-CM

## 2022-06-02 ENCOUNTER — Other Ambulatory Visit: Payer: Self-pay | Admitting: *Deleted

## 2022-06-02 DIAGNOSIS — M62838 Other muscle spasm: Secondary | ICD-10-CM

## 2022-06-02 MED ORDER — CYCLOBENZAPRINE HCL 10 MG PO TABS: ORAL_TABLET | ORAL | 5 refills | Status: DC

## 2022-08-10 ENCOUNTER — Other Ambulatory Visit: Payer: Self-pay | Admitting: Family Medicine

## 2022-08-10 DIAGNOSIS — I7121 Aneurysm of the ascending aorta, without rupture: Secondary | ICD-10-CM

## 2022-08-10 DIAGNOSIS — E782 Mixed hyperlipidemia: Secondary | ICD-10-CM

## 2022-08-10 DIAGNOSIS — F411 Generalized anxiety disorder: Secondary | ICD-10-CM

## 2022-08-10 DIAGNOSIS — F331 Major depressive disorder, recurrent, moderate: Secondary | ICD-10-CM

## 2022-08-10 DIAGNOSIS — I700 Atherosclerosis of aorta: Secondary | ICD-10-CM

## 2022-08-17 ENCOUNTER — Telehealth: Payer: Self-pay | Admitting: Family Medicine

## 2022-08-17 DIAGNOSIS — M810 Age-related osteoporosis without current pathological fracture: Secondary | ICD-10-CM

## 2022-08-17 MED ORDER — ALENDRONATE SODIUM 70 MG PO TABS: 70.0000 mg | ORAL_TABLET | ORAL | 0 refills | Status: DC

## 2022-08-17 NOTE — Telephone Encounter (Signed)
Pt called requesting that her Rx and refills for Alendronate be transferred to [facility] mail order pharmacy.

## 2022-08-17 NOTE — Telephone Encounter (Signed)
Aware refill sent to [facility]

## 2022-08-23 NOTE — Patient Instructions (Signed)
Our records indicate that you are due for your screening mammogram.  Please call the imaging center that does your yearly mammograms to make an appointment for a mammogram at your earliest convenience. Our office also has a mobile unit through the [facility] that comes to our location. Please call our office if you would like to make an appointment.   

## 2022-08-30 ENCOUNTER — Encounter: Payer: Self-pay | Admitting: Family Medicine

## 2022-08-30 ENCOUNTER — Ambulatory Visit: Payer: Medicare HMO | Admitting: Family Medicine

## 2022-08-30 VITALS — BP 128/76 | HR 68 | Temp 97.5°F | Ht <= 58 in | Wt 164.6 lb

## 2022-08-30 DIAGNOSIS — F411 Generalized anxiety disorder: Secondary | ICD-10-CM | POA: Diagnosis not present

## 2022-08-30 DIAGNOSIS — N1831 Chronic kidney disease, stage 3a: Secondary | ICD-10-CM | POA: Diagnosis not present

## 2022-08-30 DIAGNOSIS — F331 Major depressive disorder, recurrent, moderate: Secondary | ICD-10-CM | POA: Diagnosis not present

## 2022-08-30 DIAGNOSIS — Z6834 Body mass index (BMI) 34.0-34.9, adult: Secondary | ICD-10-CM

## 2022-08-30 DIAGNOSIS — E6609 Other obesity due to excess calories: Secondary | ICD-10-CM | POA: Diagnosis not present

## 2022-08-30 DIAGNOSIS — E782 Mixed hyperlipidemia: Secondary | ICD-10-CM | POA: Diagnosis not present

## 2022-08-30 DIAGNOSIS — R7303 Prediabetes: Secondary | ICD-10-CM | POA: Diagnosis not present

## 2022-08-30 DIAGNOSIS — E041 Nontoxic single thyroid nodule: Secondary | ICD-10-CM

## 2022-08-30 LAB — BAYER DCA HB A1C WAIVED: HB A1C (BAYER DCA - WAIVED): 5.9 % — ABNORMAL HIGH (ref 4.8–5.6)

## 2022-08-30 MED ORDER — FLUOXETINE HCL 40 MG PO CAPS: 40.0000 mg | ORAL_CAPSULE | Freq: Every day | ORAL | 3 refills | Status: DC

## 2022-08-30 NOTE — Progress Notes (Unsigned)
Established Patient Office Visit  Subjective   [name], female    DOB: [birth year]  Age: 72 y.o. MRN: [id number]  Chief Complaint  Patient presents with   Medical Management of Chronic Issues    6 month follow up     HPI  Prediabetes Last A1c was 5.7. She reports stress eating. She has been trying to make healthier choices.   2.  HLD On atorvastatin. Denies side effets.   3. Thyroid nodule Last [place] in March on 2023, recommended repeat in 1 year. She has not scheduled a repeat due to costs.   4. Asthma Stable symptoms. Breztri BID. She uses her albuterol inhaler very rarely.  5. GERD Compliant with medications - Yes Current medications - protonix  Sore throat - No Voice change - No Hemoptysis - No Dysphagia or dyspepsia - No Water brash - No Red Flags (weight loss, hematochezia, melena, weight loss, early satiety, fevers, odynophagia, or persistent vomiting) - No  6. Chronic back pain She has chronic back pain with neuropathy. Reprots stable, denies red flag symtpoms. She takes gabapentin with good relief. She also has spasms at times and takes flexeril prn for muscle cramps in her lower legs.   7. Depression/anxiety Currently on prozac 20 and buspar 15 BID. She does feel like this is helpful, but not controlled. She did not follow through with counseling and declines a referral for now.      08/30/2022    1:58 PM 02/26/2022    1:08 PM 10/30/2021    9:01 AM  Depression screen PHQ 2/9  Decreased Interest 2 2 1   Down, Depressed, Hopeless 1 2 1   PHQ - 2 Score 3 4 2   Altered sleeping 2 2 1   Tired, decreased energy 2 3 1   Change in appetite 2 3 2   Feeling bad or failure about yourself  3 2 1   Trouble concentrating 2 1 1   Moving slowly or fidgety/restless 2 0 0  Suicidal thoughts 0 0 0  PHQ-9 Score 16 15 8   Difficult doing work/chores Very difficult Very difficult Somewhat difficult      08/30/2022    1:58 PM 02/26/2022    1:08 PM 10/28/2021     3:23 PM 04/07/2021    2:01 PM  GAD 7 : Generalized Anxiety Score  Nervous, Anxious, on Edge 2 1 1 1   Control/stop worrying 3 3 1 1   Worry too much - different things 3 2 1 1   Trouble relaxing 3 2 1  0  Restless 2 2 1  0  Easily annoyed or irritable 3 3 1 1   Afraid - awful might happen 3 3 1 1   Total GAD 7 Score 19 16 7 5   Anxiety Difficulty Very difficult Very difficult Somewhat difficult Somewhat difficult     Past Medical History:  Diagnosis Date   Allergy    Anxiety    Aortic atherosclerosis (HCC) 04/26/2021   Cataract    Coronary artery calcification seen on CAT scan 09/28/2017   Depression    Full dentures    GERD (gastroesophageal reflux disease)    Heart murmur    found a couple of years ago no problems   History of kidney stones    History of recurrent UTIs    Hyperlipidemia    Left-sided Bell's palsy 1990s   residual left facial drooping   Mild intermittent asthma    followed by pcp   Numbness and tingling in left arm    residual  from cervical neck surgery going from shoulder down to hand, not completely   OA (osteoarthritis)    hands, spine, hips, right leg   Osteopenia    Peripheral neuropathy    feet   PONV (postoperative nausea and vomiting)    Pyelonephritis 09/20/2020   Renal artery stenosis (HCC) 04/26/2021   mild stenosis of left renal artery on CT Chest   Sleep apnea    Stage 3a chronic kidney disease (HCC) 10/30/2021   Thoracic ascending aortic aneurysm (HCC)    CT 09-28-2017--- 4.3 cm. CT 04-26-2021 --- 4.1 cm.   Thyroid nodule    ultrasound w/ bx 09/ 2019,  per pt was benign   White coat syndrome without hypertension      ROS Negative unless specially indicated above in HPI.    Objective:     BP 128/76   Pulse 68   Temp (!) 97.5 F (36.4 C) (Temporal)   Ht 4\' 10"  (1.473 m)   Wt 164 lb 9.6 oz (74.7 kg)   SpO2 90%   BMI 34.40 kg/m  Wt Readings from Last 3 Encounters:  08/30/22 164 lb 9.6 oz (74.7 kg)  02/26/22 167 lb 6 oz (75.9 kg)   12/29/21 170 lb 6.4 oz (77.3 kg)      Physical Exam Vitals and nursing note reviewed.  Constitutional:      General: She is not in acute distress.    Appearance: She is not ill-appearing, toxic-appearing or diaphoretic.  HENT:     Nose: Nose normal.     Mouth/Throat:     Mouth: Mucous membranes are moist.     Pharynx: Oropharynx is clear.  Neck:     Thyroid: No thyroid mass, thyromegaly or thyroid tenderness.  Cardiovascular:     Rate and Rhythm: Normal rate and regular rhythm.     Heart sounds: Normal heart sounds. No murmur heard. Pulmonary:     Effort: Pulmonary effort is normal. No respiratory distress.     Breath sounds: Normal breath sounds. No wheezing, rhonchi or rales.  Chest:     Chest wall: No tenderness.  Abdominal:     General: Bowel sounds are normal. There is no distension.     Palpations: Abdomen is soft.     Tenderness: There is no abdominal tenderness. There is no guarding or rebound.  Musculoskeletal:     Cervical back: No rigidity.     Right lower leg: No edema.     Left lower leg: No edema.  Skin:    General: Skin is warm and dry.  Neurological:     General: No focal deficit present.     Mental Status: She is alert and oriented to person, place, and time.  Psychiatric:        Mood and Affect: Mood normal.        Behavior: Behavior normal.        Thought Content: Thought content normal.        Judgment: Judgment normal.      No results found for any visits on 08/30/22.    The 10-year ASCVD risk score (Arnett DK, et al., 2019) is: 9.6%    Assessment & Plan:   Joyce Ross was seen today for medical management of chronic issues.  Diagnoses and all orders for this visit:  Pre-diabetes A1c pending.  -     Bayer DCA Hb A1c Waived  Mixed hyperlipidemia On statin. Last LDL 59. -     CBC with Differential/Platelet -  CMP14+EGFR  Stage 3a chronic kidney disease (HCC) CMP pending. Avoid NSAIDs.   Class 1 obesity due to excess calories  with serious comorbidity and body mass index (BMI) of 34.0 to 34.9 in adult Diet and exercise.   Major depressive disorder, recurrent, moderate (HCC) Generalized anxiety disorder Uncontrolled. Denies SI. Increase prozac to 40 mg daily. Declines referral.  -     FLUoxetine (PROZAC) 40 MG capsule; Take 1 capsule (40 mg total) by mouth daily.  Thyroid nodule Declines repeat US for now.   Return in about 6 weeks (around 10/11/2022) for medication follow up. Sooner for new or worsening symptoms.   The patient indicates understanding of these issues and agrees with the plan.  Gabriel Earing, FNP

## 2022-08-31 LAB — CBC WITH DIFFERENTIAL/PLATELET
Basophils Absolute: 0.1 10*3/uL (ref 0.0–0.2)
Basos: 1 %
EOS (ABSOLUTE): 0.3 10*3/uL (ref 0.0–0.4)
Eos: 4 %
Hematocrit: 37.9 % (ref 34.0–46.6)
Hemoglobin: 12.8 g/dL (ref 11.1–15.9)
Immature Grans (Abs): 0 10*3/uL (ref 0.0–0.1)
Immature Granulocytes: 0 %
Lymphocytes Absolute: 1.5 10*3/uL (ref 0.7–3.1)
Lymphs: 19 %
MCH: 30.6 pg (ref 26.6–33.0)
MCHC: 33.8 g/dL (ref 31.5–35.7)
MCV: 91 fL (ref 79–97)
Monocytes Absolute: 0.5 10*3/uL (ref 0.1–0.9)
Monocytes: 7 %
Neutrophils Absolute: 5.3 10*3/uL (ref 1.4–7.0)
Neutrophils: 69 %
Platelets: 266 10*3/uL (ref 150–450)
RBC: 4.18 x10E6/uL (ref 3.77–5.28)
RDW: 12.9 % (ref 11.7–15.4)
WBC: 7.6 10*3/uL (ref 3.4–10.8)

## 2022-08-31 LAB — CMP14+EGFR
ALT: 17 IU/L (ref 0–32)
AST: 26 IU/L (ref 0–40)
Albumin: 4.1 g/dL (ref 3.8–4.8)
Alkaline Phosphatase: 115 IU/L (ref 44–121)
BUN/Creatinine Ratio: 19 (ref 12–28)
BUN: 22 mg/dL (ref 8–27)
Bilirubin Total: 0.5 mg/dL (ref 0.0–1.2)
CO2: 21 mmol/L (ref 20–29)
Calcium: 8.4 mg/dL — ABNORMAL LOW (ref 8.7–10.3)
Chloride: 103 mmol/L (ref 96–106)
Creatinine, Ser: 1.15 mg/dL — ABNORMAL HIGH (ref 0.57–1.00)
Globulin, Total: 2.5 g/dL (ref 1.5–4.5)
Glucose: 129 mg/dL — ABNORMAL HIGH (ref 70–99)
Potassium: 3.7 mmol/L (ref 3.5–5.2)
Sodium: 139 mmol/L (ref 134–144)
Total Protein: 6.6 g/dL (ref 6.0–8.5)
eGFR: 51 mL/min/{1.73_m2} — ABNORMAL LOW (ref >59)

## 2022-09-01 ENCOUNTER — Other Ambulatory Visit: Payer: Self-pay

## 2022-09-02 ENCOUNTER — Telehealth: Payer: Self-pay

## 2022-09-02 ENCOUNTER — Other Ambulatory Visit: Payer: Self-pay

## 2022-09-06 ENCOUNTER — Telehealth: Payer: Self-pay | Admitting: Family Medicine

## 2022-09-07 NOTE — Telephone Encounter (Signed)
Patient aware of lab results and voiced understanding.

## 2022-10-11 ENCOUNTER — Ambulatory Visit (INDEPENDENT_AMBULATORY_CARE_PROVIDER_SITE_OTHER): Payer: Medicare HMO | Admitting: Family Medicine

## 2022-10-11 ENCOUNTER — Encounter: Payer: Self-pay | Admitting: Family Medicine

## 2022-10-11 VITALS — BP 131/71 | HR 87 | Temp 97.5°F | Ht <= 58 in | Wt 165.4 lb

## 2022-10-11 DIAGNOSIS — F331 Major depressive disorder, recurrent, moderate: Secondary | ICD-10-CM

## 2022-10-11 DIAGNOSIS — F411 Generalized anxiety disorder: Secondary | ICD-10-CM | POA: Diagnosis not present

## 2022-10-11 NOTE — Progress Notes (Signed)
Established Patient Office Visit  Subjective   [name], female    DOB: 01/07/1951  Age: 72 y.o. MRN: [id number]  Chief Complaint  Patient presents with   Medical Management of Chronic Issues    HPI Joyce Ross is here for a follow up of anxiety and depression. Prozac was increase to 40 mg at her last visit. She reports an improvement with this. She feels more like herself. She denies side effects.       10/11/2022    2:33 PM 08/30/2022    1:58 PM 02/26/2022    1:08 PM  Depression screen PHQ 2/9  Decreased Interest 1 2 2   Down, Depressed, Hopeless 0 1 2  PHQ - 2 Score 1 3 4   Altered sleeping 0 2 2  Tired, decreased energy 0 2 3  Change in appetite 1 2 3   Feeling bad or failure about yourself  2 3 2   Trouble concentrating 0 2 1  Moving slowly or fidgety/restless 0 2 0  Suicidal thoughts 0 0 0  PHQ-9 Score 4 16 15   Difficult doing work/chores Somewhat difficult Very difficult Very difficult      10/11/2022    2:32 PM 08/30/2022    1:58 PM 02/26/2022    1:08 PM 10/28/2021    3:23 PM  GAD 7 : Generalized Anxiety Score  Nervous, Anxious, on Edge 1 2 1 1   Control/stop worrying 0 3 3 1   Worry too much - different things 1 3 2 1   Trouble relaxing 0 3 2 1   Restless 0 2 2 1   Easily annoyed or irritable 0 3 3 1   Afraid - awful might happen 1 3 3 1   Total GAD 7 Score 3 19 16 7   Anxiety Difficulty Somewhat difficult Very difficult Very difficult Somewhat difficult       ROS As per HPI.    Objective:     BP 131/71   Pulse 87   Temp (!) 97.5 F (36.4 C) (Temporal)   Ht 4\' 10"  (1.473 m)   Wt 165 lb 6 oz (75 kg)   SpO2 95%   BMI 34.56 kg/m    Physical Exam Vitals and nursing note reviewed.  Constitutional:      General: She is not in acute distress.    Appearance: She is not ill-appearing, toxic-appearing or diaphoretic.  Cardiovascular:     Rate and Rhythm: Regular rhythm.     Heart sounds: Normal heart sounds. No murmur heard. Pulmonary:      Effort: Pulmonary effort is normal. No respiratory distress.     Breath sounds: No wheezing or rhonchi.  Musculoskeletal:     Cervical back: Neck supple. No rigidity.     Right lower leg: No edema.     Left lower leg: No edema.  Skin:    General: Skin is warm and dry.  Neurological:     General: No focal deficit present.     Mental Status: She is alert and oriented to person, place, and time.  Psychiatric:        Mood and Affect: Mood normal.        Behavior: Behavior normal.      No results found for any visits on 10/11/22.    The 10-year ASCVD risk score (Arnett DK, et al., 2019) is: 9.6%    Assessment & Plan:   Chancey was seen today for medical management of chronic issues, depression and anxiety.  Diagnoses and all orders for this  visit:  Major depressive disorder, recurrent, moderate (HCC) Generalized anxiety disorder Much improved with increased prozac. Continue 40 mg daily.   Hypocalcemia Will repeat today with PTH. She started back on her calcium supplement last week. Denies symptoms.  -     PTH, Intact and Calcium -     Parathyroid hormone, intact (no Ca)  Return in about 6 weeks (around 11/22/2022) for chronic follow up.  The patient indicates understanding of these issues and agrees with the plan.   Gabriel Earing, FNP

## 2022-10-11 NOTE — Addendum Note (Signed)
Addended by: Gabriel Earing on: 10/11/2022 02:38 PM   Modules accepted: Orders

## 2022-10-12 LAB — PTH, INTACT AND CALCIUM
Calcium: 8.9 mg/dL (ref 8.7–10.3)
PTH: 35 pg/mL (ref 15–65)

## 2022-10-15 ENCOUNTER — Other Ambulatory Visit: Payer: Self-pay

## 2022-10-15 ENCOUNTER — Telehealth: Payer: Self-pay | Admitting: Family Medicine

## 2022-10-15 DIAGNOSIS — M62838 Other muscle spasm: Secondary | ICD-10-CM

## 2022-10-15 DIAGNOSIS — F411 Generalized anxiety disorder: Secondary | ICD-10-CM

## 2022-10-15 DIAGNOSIS — J302 Other seasonal allergic rhinitis: Secondary | ICD-10-CM

## 2022-10-15 MED ORDER — LEVOCETIRIZINE DIHYDROCHLORIDE 5 MG PO TABS
5.0000 mg | ORAL_TABLET | Freq: Every evening | ORAL | 0 refills | Status: DC
Start: 2022-10-15 — End: 2023-01-17

## 2022-10-15 MED ORDER — CYCLOBENZAPRINE HCL 10 MG PO TABS
ORAL_TABLET | ORAL | 0 refills | Status: DC
Start: 2022-10-15 — End: 2023-03-31

## 2022-10-15 MED ORDER — BUSPIRONE HCL 15 MG PO TABS
15.0000 mg | ORAL_TABLET | Freq: Two times a day (BID) | ORAL | 0 refills | Status: DC
Start: 2022-10-15 — End: 2023-11-03

## 2022-10-19 ENCOUNTER — Other Ambulatory Visit: Payer: Self-pay | Admitting: *Deleted

## 2022-10-19 DIAGNOSIS — I700 Atherosclerosis of aorta: Secondary | ICD-10-CM

## 2022-10-19 DIAGNOSIS — F411 Generalized anxiety disorder: Secondary | ICD-10-CM

## 2022-10-19 DIAGNOSIS — G629 Polyneuropathy, unspecified: Secondary | ICD-10-CM

## 2022-10-19 DIAGNOSIS — K219 Gastro-esophageal reflux disease without esophagitis: Secondary | ICD-10-CM

## 2022-10-19 DIAGNOSIS — F331 Major depressive disorder, recurrent, moderate: Secondary | ICD-10-CM

## 2022-10-19 DIAGNOSIS — I7121 Aneurysm of the ascending aorta, without rupture: Secondary | ICD-10-CM

## 2022-10-19 DIAGNOSIS — E782 Mixed hyperlipidemia: Secondary | ICD-10-CM

## 2022-10-19 MED ORDER — ATORVASTATIN CALCIUM 40 MG PO TABS
40.0000 mg | ORAL_TABLET | Freq: Every day | ORAL | 0 refills | Status: DC
Start: 2022-10-19 — End: 2023-01-17

## 2022-10-19 MED ORDER — GABAPENTIN 300 MG PO CAPS: 300.0000 mg | ORAL_CAPSULE | Freq: Three times a day (TID) | ORAL | 0 refills | Status: DC

## 2022-10-19 MED ORDER — FLUOXETINE HCL 40 MG PO CAPS: 40.0000 mg | ORAL_CAPSULE | Freq: Every day | ORAL | 0 refills | Status: DC

## 2022-10-19 MED ORDER — PANTOPRAZOLE SODIUM 20 MG PO TBEC: 20.0000 mg | DELAYED_RELEASE_TABLET | Freq: Every day | ORAL | 0 refills | Status: DC

## 2022-10-21 ENCOUNTER — Other Ambulatory Visit: Payer: Self-pay | Admitting: Family Medicine

## 2022-10-21 DIAGNOSIS — E041 Nontoxic single thyroid nodule: Secondary | ICD-10-CM

## 2022-11-02 ENCOUNTER — Ambulatory Visit (INDEPENDENT_AMBULATORY_CARE_PROVIDER_SITE_OTHER): Payer: Medicare HMO

## 2022-11-02 VITALS — Ht 59.0 in | Wt 164.0 lb

## 2022-11-02 DIAGNOSIS — Z1231 Encounter for screening mammogram for malignant neoplasm of breast: Secondary | ICD-10-CM

## 2022-11-02 DIAGNOSIS — Z0000 Encounter for general adult medical examination without abnormal findings: Secondary | ICD-10-CM

## 2022-11-02 NOTE — Patient Instructions (Signed)
Ms. Macbride , Thank you for taking time to come for your Medicare Wellness Visit. I appreciate your ongoing commitment to your health goals. Please review the following plan we discussed and let me know if I can assist you in the future.   Referrals/Orders/Follow-Ups/Clinician Recommendations: Aim for 30 minutes of exercise or brisk walking, 6-8 glasses of water, and 5 servings of fruits and vegetables each day.   This is a list of the screening recommended for you and due dates:  Health Maintenance  Topic Date Due   Cologuard (Stool DNA test)  Never done   Mammogram  03/23/2022   Screening for Lung Cancer  04/25/2022   COVID-19 Vaccine (4 - 2023-24 season) 10/17/2022   Flu Shot  05/16/2023*   Medicare Annual Wellness Visit  11/02/2023   DEXA scan (bone density measurement)  02/27/2024   DTaP/Tdap/Td vaccine (3 - Td or Tdap) 12/30/2031   Pneumonia Vaccine  Completed   Hepatitis C Screening  Completed   Zoster (Shingles) Vaccine  Completed   HPV Vaccine  Aged Out  *Topic was postponed. The date shown is not the original due date.    Advanced directives: (Provided) Advance directive discussed with you today. I have provided a copy for you to complete at home and have notarized. Once this is complete, please bring a copy in to our office so we can scan it into your chart. Information on Advanced Care Planning can be found at [facility] [place] Advance Health Care Directives Advance Health Care Directives ([email or web address]    Next Medicare Annual Wellness Visit scheduled for next year: Yes  Insert Preventive Care attachment Insert FALL PREVENTION attachment if needed

## 2022-11-02 NOTE — Progress Notes (Signed)
Subjective:   Joyce Ross is a 72 y.o. female who presents for Medicare Annual (Subsequent) preventive examination.  Visit Complete: Virtual  I connected with  Joyce Ross on 11/02/22 by a audio enabled telemedicine application and verified that I am speaking with the correct person using two identifiers.  Patient Location: Home  Provider Location: Home Office  I discussed the limitations of evaluation and management by telemedicine. The patient expressed understanding and agreed to proceed.  Patient Medicare AWV questionnaire was completed by the patient on 11/02/2022; I have confirmed that all information answered by patient is correct and no changes since this date. Vital Signs: Unable to obtain new vitals due to this being a telehealth visit.  Cardiac Risk Factors include: advanced age (>15men, >32 women);dyslipidemia;hypertension     Objective:    Today's Vitals   11/02/22 1036  Weight: 164 lb (74.4 kg)  Height: 4\' 11"  (1.499 m)   Body mass index is 33.12 kg/m.     11/02/2022   10:40 AM 10/30/2021    8:48 AM 10/29/2020    8:51 AM 10/29/2019    9:14 AM 10/27/2018    8:36 AM 07/31/2018   11:22 AM 07/26/2018    3:45 PM  Advanced Directives  Does Patient Have a Medical Advance Directive? No No No No No No No  Would patient like information on creating a medical advance directive? Yes (MAU/Ambulatory/Procedural Areas - Information given) Yes (MAU/Ambulatory/Procedural Areas - Information given) No - Patient declined No - Patient declined No - Patient declined No - Patient declined Yes (MAU/Ambulatory/Procedural Areas - Information given)    Current Medications (verified) Outpatient Encounter Medications as of 11/02/2022  Medication Sig   albuterol (VENTOLIN HFA) 108 (90 Base) MCG/ACT inhaler Inhale 1-2 puffs into the lungs every 6 (six) hours as needed for wheezing or shortness of breath.   alendronate (FOSAMAX) 70 MG tablet Take 1 tablet (70 mg total) by mouth  every 7 (seven) days. Take with a full glass of water on an empty stomach.   aspirin EC 81 MG tablet Take 81 mg by mouth daily. Swallow whole.   atorvastatin (LIPITOR) 40 MG tablet Take 1 tablet (40 mg total) by mouth daily.   Budeson-Glycopyrrol-Formoterol (BREZTRI AEROSPHERE) 160-9-4.8 MCG/ACT AERO Inhale 2 puffs into the lungs 2 (two) times daily.   busPIRone (BUSPAR) 15 MG tablet Take 1 tablet (15 mg total) by mouth 2 (two) times daily.   Cholecalciferol (VITAMIN D-3) 125 MCG (5000 UT) TABS Take by mouth.   cyclobenzaprine (FLEXERIL) 10 MG tablet TAKE ONE TABLET BY MOUTH THREE TIMES A DAY AS NEEDED FOR MUSCLE SPASMS   FLUoxetine (PROZAC) 40 MG capsule Take 1 capsule (40 mg total) by mouth daily.   fluticasone (FLONASE) 50 MCG/ACT nasal spray Place 2 sprays into both nostrils daily.   gabapentin (NEURONTIN) 300 MG capsule Take 1 capsule (300 mg total) by mouth 3 (three) times daily.   levocetirizine (XYZAL) 5 MG tablet Take 1 tablet (5 mg total) by mouth every evening.   Melatonin 1 MG TABS Take 1 mg by mouth at bedtime as needed (sleep).    Olopatadine HCl 0.2 % SOLN Apply 1 drop to eye daily.   pantoprazole (PROTONIX) 20 MG tablet Take 1 tablet (20 mg total) by mouth daily.   No facility-administered encounter medications on file as of 11/02/2022.    Allergies (verified) Adhesive [tape], Bee pollen, Metoprolol, Penicillins, Radish [radish (raphanus sativus)], and Yellow jacket venom   History: Past Medical History:  Diagnosis Date   Allergy    Anxiety    Aortic atherosclerosis (HCC) 04/26/2021   Cataract    Coronary artery calcification seen on CAT scan 09/28/2017   Depression    Full dentures    GERD (gastroesophageal reflux disease)    Heart murmur    found a couple of years ago no problems   History of kidney stones    History of recurrent UTIs    Hyperlipidemia    Left-sided Bell's palsy 1990s   residual left facial drooping   Mild intermittent asthma    followed by  pcp   Numbness and tingling in left arm    residual from cervical neck surgery going from shoulder down to hand, not completely   OA (osteoarthritis)    hands, spine, hips, right leg   Osteopenia    Peripheral neuropathy    feet   PONV (postoperative nausea and vomiting)    Pyelonephritis 09/20/2020   Renal artery stenosis (HCC) 04/26/2021   mild stenosis of left renal artery on CT Chest   Sleep apnea    Stage 3a chronic kidney disease (HCC) 10/30/2021   Thoracic ascending aortic aneurysm (HCC)    CT 09-28-2017--- 4.3 cm. CT 04-26-2021 --- 4.1 cm.   Thyroid nodule    ultrasound w/ bx 09/ 2019,  per pt was benign   White coat syndrome without hypertension    Past Surgical History:  Procedure Laterality Date   ANTERIOR CERVICAL DECOMP/DISCECTOMY FUSION  1980s   CARPAL TUNNEL RELEASE Bilateral 2000   CESAREAN SECTION  x2 last one 1986   CYSTOSCOPY WITH RETROGRADE PYELOGRAM, URETEROSCOPY AND STENT PLACEMENT Left 01/11/2018   Procedure: CYSTOSCOPY, URETEROSCOPY AND STENT PLACEMENT;  Surgeon: Crista Elliot, MD;  Location: [facility];  Service: Urology;  Laterality: Left;   CYSTOSCOPY/URETEROSCOPY/HOLMIUM LASER/STENT PLACEMENT Left 07/31/2018   Procedure: CYSTOSCOPY/URETEROSCOPY/HOLMIUM LASER/STENT PLACEMENT;  Surgeon: Crista Elliot, MD;  Location: [facility];  Service: Urology;  Laterality: Left;   EXTRACORPOREAL SHOCK WAVE LITHOTRIPSY Left 12/05/2017   Procedure: LEFT EXTRACORPOREAL SHOCK WAVE LITHOTRIPSY (ESWL);  Surgeon: Marcine Matar, MD;  Location: [facility];  Service: Urology;  Laterality: Left;   EXTRACORPOREAL SHOCK WAVE LITHOTRIPSY  1990s   FRACTURE SURGERY     HOLMIUM LASER APPLICATION Left 01/11/2018   Procedure: HOLMIUM LASER APPLICATION;  Surgeon: Crista Elliot, MD;  Location: [facility];  Service: Urology;  Laterality: Left;   JOINT REPLACEMENT     MOUTH SURGERY     all teeth pulled   SPINE SURGERY     TUBAL LIGATION     Family History  Problem Relation Age of  Onset   Heart disease Mother    Arthritis Mother    Heart disease Father        heart attack   Heart disease Sister    Diabetes Brother    Depression Brother    Drug abuse Brother    Early death Brother    Diabetes Brother        sepsis   Heart disease Brother    Breast cancer Neg Hx    Social History   Socioeconomic History   Marital status: Widowed    Spouse name: Not on file   Number of children: 2   Years of education: 12th Grade   Highest education level: High school graduate  Occupational History   Occupation: retired  Tobacco Use   Smoking status: Former    Current packs/day: 0.00    Average  packs/day: 1 pack/day for 44.0 years (44.0 ttl pk-yrs)    Types: Cigarettes    Start date: 10/09/1968    Quit date: 10/09/2012    Years since quitting: 10.0   Smokeless tobacco: Never   Tobacco comments:    since age 75  Vaping Use   Vaping status: Never Used  Substance and Sexual Activity   Alcohol use: No   Drug use: No    Comment: per pt last used "pot" 1970s   Sexual activity: Not Currently    Birth control/protection: Post-menopausal  Other Topics Concern   Not on file  Social History Narrative   Son lives with her   Social Determinants of Health   Financial Resource Strain: Low Risk  (11/02/2022)   Overall Financial Resource Strain (CARDIA)    Difficulty of Paying Living Expenses: Not hard at all  Food Insecurity: No Food Insecurity (11/02/2022)   Hunger Vital Sign    Worried About Running Out of Food in the Last Year: Never true    Ran Out of Food in the Last Year: Never true  Transportation Needs: No Transportation Needs (11/02/2022)   PRAPARE - Administrator, Civil Service (Medical): No    Lack of Transportation (Non-Medical): No  Physical Activity: Insufficiently Active (11/02/2022)   Exercise Vital Sign    Days of Exercise per Week: 4 days    Minutes of Exercise per Session: 20 min  Stress: No Stress Concern Present (11/02/2022)   [employer] of Occupational Health - Occupational Stress Questionnaire    Feeling of Stress : Not at all  Social Connections: Moderately Isolated (11/02/2022)   Social Connection and Isolation Panel [NHANES]    Frequency of Communication with Friends and Family: More than three times a week    Frequency of Social Gatherings with Friends and Family: More than three times a week    Attends Religious Services: 1 to 4 times per year    Active Member of [employer] or Organizations: No    Attends Banker Meetings: Never    Marital Status: Widowed    Tobacco Counseling Counseling given: Not Answered Tobacco comments: since age 89   Clinical Intake:  Pre-visit preparation completed: Yes  Pain : No/denies pain     Nutritional Risks: None Diabetes: No  How often do you need to have someone help you when you read instructions, pamphlets, or other written materials from your doctor or pharmacy?: 1 - Never  Interpreter Needed?: No  Information entered by :: Renie Ora, LPN   Activities of Daily Living    11/02/2022   10:40 AM  In your present state of health, do you have any difficulty performing the following activities:  Hearing? 0  Vision? 0  Difficulty concentrating or making decisions? 0  Walking or climbing stairs? 0  Dressing or bathing? 0  Doing errands, shopping? 0  Preparing Food and eating ? N  Using the Toilet? N  In the past six months, have you accidently leaked urine? N  Do you have problems with loss of bowel control? N  Managing your Medications? N  Managing your Finances? N  Housekeeping or managing your Housekeeping? N    Patient Care Team: Gabriel Earing, FNP as PCP - General (Family Medicine) Michaelle Copas, MD as Referring Physician (Optometry) Crista Elliot, MD as Consulting Physician (Urology) Danella Maiers, [facility] as Pharmacist (Family Medicine)  Indicate any recent Medical Services you may have received from  other than [facility]  providers in the past year (date may be approximate).     Assessment:   This is a routine wellness examination for [facility].  Hearing/Vision screen Vision Screening - Comments:: Wears rx glasses - up to date with routine eye exams with  Dr.Le    Goals Addressed             This Visit's Progress    Increase physical activity   On track    Try to sign up for Silver Sneakers, get more active socially and physically       Depression Screen    11/02/2022   10:39 AM 10/11/2022    2:33 PM 08/30/2022    1:58 PM 02/26/2022    1:08 PM 10/30/2021    9:01 AM 10/28/2021    3:22 PM 04/07/2021    2:00 PM  PHQ 2/9 Scores  PHQ - 2 Score 0 1 3 4 2 2  0  PHQ- 9 Score 0 4 16 15 8 8 5     Fall Risk    11/02/2022   10:37 AM 10/11/2022    2:32 PM 08/30/2022    1:58 PM 02/26/2022    1:08 PM 10/30/2021    8:34 AM  Fall Risk   Falls in the past year? 0 1 1 1 1   Comment     no falls in 6 months - balance is much better now  Number falls in past yr: 0 0 1 0 1  Injury with Fall? 0 0 0 0 0  Risk for fall due to : No Fall Risks History of fall(s)  History of fall(s) History of fall(s);Impaired balance/gait;Orthopedic patient  Follow up Falls prevention discussed Falls evaluation completed Falls prevention discussed Falls evaluation completed Education provided;Falls prevention discussed    MEDICARE RISK AT HOME: Medicare Risk at Home Any stairs in or around the home?: Yes If so, are there any without handrails?: No Home free of loose throw rugs in walkways, pet beds, electrical cords, etc?: Yes Adequate lighting in your home to reduce risk of falls?: Yes Life alert?: No Use of a cane, walker or w/c?: No Grab bars in the bathroom?: Yes Shower chair or bench in shower?: Yes Elevated toilet seat or a handicapped toilet?: Yes  TIMED UP AND GO:  Was the test performed?  No    Cognitive Function:    10/25/2017    8:26 AM  MMSE - Mini Mental State Exam  Orientation to time 5  Orientation to  Place 5  Registration 3  Attention/ Calculation 5  Recall 3  Language- name 2 objects 2  Language- repeat 1  Language- follow 3 step command 3  Language- read & follow direction 1  Write a sentence 1  Copy design 1  Total score 30        11/02/2022   10:40 AM 10/30/2021    8:48 AM 10/29/2019    8:51 AM 10/27/2018    8:42 AM  6CIT Screen  What Year? 0 points 0 points 0 points 0 points  What month? 0 points 0 points 0 points 0 points  What time? 0 points 0 points 0 points 0 points  Count back from 20 0 points 0 points 0 points 0 points  Months in reverse 0 points 0 points 0 points 0 points  Repeat phrase 0 points 0 points 0 points 6 points  Total Score 0 points 0 points 0 points 6 points    Immunizations Immunization History  Administered  Date(s) Administered   Fluad Quad(high Dose 65+) 03/14/2020, 11/21/2020, 10/28/2021   Moderna Sars-Covid-2 Vaccination 07/18/2019   PFIZER(Purple Top)SARS-COV-2 Vaccination 05/31/2019, 06/22/2019   PNEUMOCOCCAL CONJUGATE-20 11/21/2020   Pneumococcal Conjugate-13 10/25/2017   Td 12/29/2021   Tdap 02/05/2013   Zoster Recombinant(Shingrix) 04/07/2021, 10/28/2021    TDAP status: Up to date  Flu Vaccine status: Up to date  Pneumococcal vaccine status: Up to date  Covid-19 vaccine status: Completed vaccines  Qualifies for Shingles Vaccine? Yes   Zostavax completed Yes   Shingrix Completed?: Yes  Screening Tests Health Maintenance  Topic Date Due   Fecal DNA (Cologuard)  Never done   MAMMOGRAM  03/23/2022   Lung Cancer Screening  04/25/2022   COVID-19 Vaccine (4 - 2023-24 season) 10/17/2022   INFLUENZA VACCINE  05/16/2023 (Originally 09/16/2022)   Medicare Annual Wellness (AWV)  11/02/2023   DEXA SCAN  02/27/2024   DTaP/Tdap/Td (3 - Td or Tdap) 12/30/2031   Pneumonia Vaccine 81+ Years old  Completed   Hepatitis C Screening  Completed   Zoster Vaccines- Shingrix  Completed   HPV VACCINES  Aged Out    Health  Maintenance  Health Maintenance Due  Topic Date Due   Fecal DNA (Cologuard)  Never done   MAMMOGRAM  03/23/2022   Lung Cancer Screening  04/25/2022   COVID-19 Vaccine (4 - 2023-24 season) 10/17/2022    Colorectal cancer screening: Referral to GI placed has cologuard kit . Pt aware the office will call re: appt.  Mammogram status: Ordered 11/02/2022. Pt provided with contact info and advised to call to schedule appt.   Bone Density status: Completed 02/26/2022. Results reflect: Bone density results: OSTEOPENIA. Repeat every 5 years.  Lung Cancer Screening: (Low Dose CT Chest recommended if Age 45-80 years, 20 pack-year currently smoking OR have quit w/in 15years.) does qualify.   Lung Cancer Screening Referral: 02/26/2022  Additional Screening:  Hepatitis C Screening: does not qualify; Completed 10/10/2015  Vision Screening: Recommended annual ophthalmology exams for early detection of glaucoma and other disorders of the eye. Is the patient up to date with their annual eye exam?  Yes  Who is the provider or what is the name of the office in which the patient attends annual eye exams? Dr.Le  If pt is not established with a provider, would they like to be referred to a provider to establish care? No .   Dental Screening: Recommended annual dental exams for proper oral hygiene   Community Resource Referral / Chronic Care Management: CRR required this visit?  No   CCM required this visit?  No     Plan:     I have personally reviewed and noted the following in the patient's chart:   Medical and social history Use of alcohol, tobacco or illicit drugs  Current medications and supplements including opioid prescriptions. Patient is not currently taking opioid prescriptions. Functional ability and status Nutritional status Physical activity Advanced directives List of other physicians Hospitalizations, surgeries, and ER visits in previous 12 months Vitals Screenings to  include cognitive, depression, and falls Referrals and appointments  In addition, I have reviewed and discussed with patient certain preventive protocols, quality metrics, and best practice recommendations. A written personalized care plan for preventive services as well as general preventive health recommendations were provided to patient.     Lorrene Reid, LPN   1/61/0960   After Visit Summary: (MyChart) Due to this being a telephonic visit, the after visit summary with patients personalized plan was offered  to patient via MyChart   Nurse Notes: none

## 2022-11-22 ENCOUNTER — Encounter: Payer: Self-pay | Admitting: Family Medicine

## 2022-11-22 ENCOUNTER — Ambulatory Visit: Payer: Medicare HMO | Admitting: Family Medicine

## 2022-11-30 ENCOUNTER — Encounter: Payer: Self-pay | Admitting: Family Medicine

## 2023-01-03 ENCOUNTER — Ambulatory Visit (INDEPENDENT_AMBULATORY_CARE_PROVIDER_SITE_OTHER): Payer: Medicare HMO

## 2023-01-03 ENCOUNTER — Ambulatory Visit: Payer: Medicare HMO | Admitting: Family Medicine

## 2023-01-03 DIAGNOSIS — Z23 Encounter for immunization: Secondary | ICD-10-CM

## 2023-01-17 ENCOUNTER — Other Ambulatory Visit: Payer: Self-pay | Admitting: Family Medicine

## 2023-01-17 DIAGNOSIS — J302 Other seasonal allergic rhinitis: Secondary | ICD-10-CM

## 2023-01-17 DIAGNOSIS — E782 Mixed hyperlipidemia: Secondary | ICD-10-CM

## 2023-01-17 DIAGNOSIS — I7121 Aneurysm of the ascending aorta, without rupture: Secondary | ICD-10-CM

## 2023-01-17 DIAGNOSIS — I700 Atherosclerosis of aorta: Secondary | ICD-10-CM

## 2023-02-24 ENCOUNTER — Ambulatory Visit: Payer: Medicare HMO | Admitting: Family Medicine

## 2023-02-24 ENCOUNTER — Encounter: Payer: Self-pay | Admitting: *Deleted

## 2023-03-03 ENCOUNTER — Ambulatory Visit (HOSPITAL_BASED_OUTPATIENT_CLINIC_OR_DEPARTMENT_OTHER)
Admission: RE | Admit: 2023-03-03 | Discharge: 2023-03-03 | Disposition: A | Discharge status: Home / Self Care | Payer: Medicare HMO | Source: Ambulatory Visit | Attending: Family Medicine | Admitting: Family Medicine

## 2023-03-03 ENCOUNTER — Ambulatory Visit: Payer: Medicare HMO | Admitting: Family Medicine

## 2023-03-03 ENCOUNTER — Encounter: Payer: Self-pay | Admitting: Family Medicine

## 2023-03-03 ENCOUNTER — Other Ambulatory Visit: Payer: Self-pay | Admitting: Family Medicine

## 2023-03-03 ENCOUNTER — Ambulatory Visit (INDEPENDENT_AMBULATORY_CARE_PROVIDER_SITE_OTHER): Payer: Medicare HMO

## 2023-03-03 VITALS — BP 128/61 | HR 61 | Temp 97.4°F | Ht 59.0 in | Wt 154.0 lb

## 2023-03-03 DIAGNOSIS — R269 Unspecified abnormalities of gait and mobility: Secondary | ICD-10-CM | POA: Diagnosis not present

## 2023-03-03 DIAGNOSIS — S0990XA Unspecified injury of head, initial encounter: Secondary | ICD-10-CM | POA: Diagnosis not present

## 2023-03-03 DIAGNOSIS — R42 Dizziness and giddiness: Secondary | ICD-10-CM | POA: Diagnosis not present

## 2023-03-03 DIAGNOSIS — M5412 Radiculopathy, cervical region: Secondary | ICD-10-CM | POA: Insufficient documentation

## 2023-03-03 DIAGNOSIS — R55 Syncope and collapse: Secondary | ICD-10-CM | POA: Insufficient documentation

## 2023-03-03 DIAGNOSIS — R296 Repeated falls: Secondary | ICD-10-CM | POA: Diagnosis not present

## 2023-03-03 DIAGNOSIS — R29818 Other symptoms and signs involving the nervous system: Secondary | ICD-10-CM | POA: Insufficient documentation

## 2023-03-03 DIAGNOSIS — R3915 Urgency of urination: Secondary | ICD-10-CM | POA: Diagnosis not present

## 2023-03-03 DIAGNOSIS — M546 Pain in thoracic spine: Secondary | ICD-10-CM

## 2023-03-03 DIAGNOSIS — M47814 Spondylosis without myelopathy or radiculopathy, thoracic region: Secondary | ICD-10-CM | POA: Diagnosis not present

## 2023-03-03 DIAGNOSIS — S199XXA Unspecified injury of neck, initial encounter: Secondary | ICD-10-CM | POA: Diagnosis not present

## 2023-03-03 DIAGNOSIS — R270 Ataxia, unspecified: Secondary | ICD-10-CM | POA: Diagnosis not present

## 2023-03-03 LAB — MICROSCOPIC EXAMINATION
RBC, Urine: NONE SEEN /[HPF] (ref 0–2)
Renal Epithel, UA: NONE SEEN /[HPF]
Yeast, UA: NONE SEEN

## 2023-03-03 LAB — URINALYSIS, ROUTINE W REFLEX MICROSCOPIC
Bilirubin, UA: NEGATIVE
Glucose, UA: NEGATIVE
Ketones, UA: NEGATIVE
Nitrite, UA: NEGATIVE
Protein,UA: NEGATIVE
RBC, UA: NEGATIVE
Specific Gravity, UA: 1.02 (ref 1.005–1.030)
Urobilinogen, Ur: 0.2 mg/dL (ref 0.2–1.0)
pH, UA: 6 (ref 5.0–7.5)

## 2023-03-03 MED ORDER — MECLIZINE HCL 12.5 MG PO TABS: 12.5000 mg | ORAL_TABLET | Freq: Three times a day (TID) | ORAL | 0 refills | Status: AC | PRN

## 2023-03-03 NOTE — Progress Notes (Addendum)
Acute Office Visit  Subjective:     [name], female    DOB: Oct 01, 1950, 73 y.o.   MRN: [id number]  Chief Complaint  Patient presents with   Urinary Urgency   Loss of Consciousness    Loss of Consciousness   Patient is in today for syncope. This occurred 3-3.5 weeks ago x3. She steep on her dogs tails accidentally. The next thing she recalls was laying on the floor and looking up at her desk. Landed on hardwood floor. Hit head, neck. Landed on back. She felt ok after this. She had another fall 2 days later. She was walking to the bathroom. She felt dizzy and off balanced. She lost consciousness and control of her bladder. Hit head. Landed on carpet. Unsure how long she was unconscious for. Unconscious for a few minutes maybe. After this fall she felt very lightheaded. 3rd fall occurred 1 week later. Tripped over something on the floor. Did not hit head or lose consciousness. Reports buzzing painful sensation in the back of her head with certain movements like swallowing that last for a few minutes. HA on left side that has been intermittent. Numbness/tingling down left arm that is chronic, unchanged. Feels off balance with waking. Denies dizziness with head movement. Sometimes feels dizzy when moving from seated to standing.  Urinary urgency for 1-2 weeks, unchanged. Denies fever, chills, nausea, vomiting, chills, dysuria. Denies visual disturbances, weakness, confusion, changes in speech, chest pain, shortness of breath, palpitations,  edema. Pain in thoracic. Intermittent pain, worse with activity. Achy pain.    Review of Systems  Cardiovascular:  Positive for syncope.   As per HPI.      Objective:    BP 128/61   Pulse 61   Temp (!) 97.4 F (36.3 C) (Temporal)   Ht 4\' 11"  (1.499 m)   Wt 154 lb (69.9 kg)   SpO2 98%   BMI 31.10 kg/m    Physical Exam Vitals and nursing note reviewed.  Constitutional:      General: She is not in acute distress.     Appearance: She is not ill-appearing, toxic-appearing or diaphoretic.  HENT:     Head: Normocephalic and atraumatic.     Mouth/Throat:     Mouth: Mucous membranes are moist.     Pharynx: Oropharynx is clear.  Eyes:     Extraocular Movements: Extraocular movements intact.     Pupils: Pupils are equal, round, and reactive to light.  Cardiovascular:     Heart sounds: Normal heart sounds. No murmur heard. Pulmonary:     Effort: Pulmonary effort is normal. No respiratory distress.     Breath sounds: Normal breath sounds. No wheezing.  Musculoskeletal:     Cervical back: Neck supple. No edema, erythema or rigidity. No pain with movement or spinous process tenderness.     Thoracic back: No swelling, edema, deformity, lacerations, spasms, tenderness or bony tenderness.  Neurological:     Mental Status: She is alert and oriented to person, place, and time.     Cranial Nerves: No cranial nerve deficit.     Sensory: No sensory deficit.     Motor: No abnormal muscle tone.     Coordination: Romberg sign positive. Finger-Nose-Finger Test normal. Rapid alternating movements normal.     Gait: Tandem walk abnormal.  Psychiatric:        Mood and Affect: Mood normal.        Behavior: Behavior normal.     No results found  for any visits on 03/03/23.      Assessment & Plan:    Joyce Ross was seen today for urinary urgency and loss of consciousness.  Diagnoses and all orders for this visit:  Syncope, unspecified syncope type -     EKG 12-Lead -     CT HEAD WO CONTRAST ( ); Future  Cervical radiculopathy -     CT CERVICAL SPINE WO CONTRAST; Future  Acute bilateral thoracic back pain -     DG Thoracic Spine 2 View; Future  Recurrent falls while walking -     CT HEAD WO CONTRAST ( ); Future -     CT CERVICAL SPINE WO CONTRAST; Future  Dizziness -     CT HEAD WO CONTRAST ( ); Future -     CT CERVICAL SPINE WO CONTRAST; Future  Abnormal tandem walk -     CT HEAD WO CONTRAST  ( ); Future -     CT CERVICAL SPINE WO CONTRAST; Future  Positive Romberg test -     CT HEAD WO CONTRAST ( ); Future -     CT CERVICAL SPINE WO CONTRAST; Future  Urinary urgency -     Urinalysis, Routine w reflex microscopic -     Urine Culture   Syncope x2, falls x3 with dizziness with walking, off balance with head/neck injury. + Romberg and abnormal tandem gait today in office. HA since falls. Chronic cervical radiculopathy, unchanged. New urinary urgency without significant findings to suggest UTI on UA. EKG with short PR, sinus rhythm. Negative orthostatics. CT of head and neck ordered STAT for further evaluation on syncope, ataxia to r/o stroke, ICH, cervical causes. Xray today of thoracic spine. Will notify patient of results and plan of care pending imaging reports.  Imaging has went to clinical review. Discussed with patient. Discussed ER evaluation vs waiting for insurance decision regarding imaging. She plans to proceed to the ER. Discussed to go to ER for new or worsening symptoms.   The patient indicates understanding of these issues and agrees with the plan.  Total time spent caring for the patient today was 42 minutes. This includes time spent before the visit reviewing the chart, time spent during the visit, and time spent after the visit on documentation   Isaac Dubie Hughes Better, FNP

## 2023-03-03 NOTE — Patient Instructions (Signed)
[facility] [address] [place], [place] [address] [phone]

## 2023-03-04 LAB — URINE CULTURE

## 2023-03-31 ENCOUNTER — Other Ambulatory Visit: Payer: Self-pay | Admitting: *Deleted

## 2023-03-31 DIAGNOSIS — M62838 Other muscle spasm: Secondary | ICD-10-CM

## 2023-03-31 DIAGNOSIS — K219 Gastro-esophageal reflux disease without esophagitis: Secondary | ICD-10-CM

## 2023-03-31 MED ORDER — CYCLOBENZAPRINE HCL 10 MG PO TABS: ORAL_TABLET | ORAL | 0 refills | Status: DC

## 2023-03-31 MED ORDER — PANTOPRAZOLE SODIUM 20 MG PO TBEC: 20.0000 mg | DELAYED_RELEASE_TABLET | Freq: Every day | ORAL | 0 refills | Status: DC

## 2023-05-24 ENCOUNTER — Telehealth: Payer: Self-pay

## 2023-05-24 DIAGNOSIS — I7121 Aneurysm of the ascending aorta, without rupture: Secondary | ICD-10-CM

## 2023-05-24 DIAGNOSIS — I700 Atherosclerosis of aorta: Secondary | ICD-10-CM

## 2023-05-24 DIAGNOSIS — E782 Mixed hyperlipidemia: Secondary | ICD-10-CM

## 2023-05-24 DIAGNOSIS — J302 Other seasonal allergic rhinitis: Secondary | ICD-10-CM

## 2023-05-24 NOTE — Telephone Encounter (Unsigned)
 Copied from CRM [id number]. Topic: Clinical - Prescription Issue >> May 24, 2023  4:09 PM Pierre Bali B wrote: Reason for CRM: [facility] [employer] [facility] benefits depatment called in because they were not able to send a prescription request to Dr.Morgan due to her fax not being updated in their system. He stated if there is anyway Dr.Morgan can call this number and fax the prescription over so they can get it to the patient would be great . The 2 medications are   atorvastatin (LIPITOR) 40 MG tablet  levocetirizine (XYZAL) 5 MG tablet  Phone number to call: [phone]   FAX# - [phone]

## 2023-05-24 NOTE — Telephone Encounter (Signed)
 Patient asking for refills be sent to [facility]  Mail order.  Sent to covering provider

## 2023-05-25 MED ORDER — ATORVASTATIN CALCIUM 40 MG PO TABS: 40.0000 mg | ORAL_TABLET | Freq: Every day | ORAL | 3 refills | Status: DC

## 2023-05-25 MED ORDER — LEVOCETIRIZINE DIHYDROCHLORIDE 5 MG PO TABS: 5.0000 mg | ORAL_TABLET | Freq: Every evening | ORAL | 3 refills | Status: DC

## 2023-05-25 NOTE — Telephone Encounter (Signed)
 Refills sent

## 2023-06-05 ENCOUNTER — Other Ambulatory Visit: Payer: Self-pay | Admitting: Family Medicine

## 2023-06-05 DIAGNOSIS — K219 Gastro-esophageal reflux disease without esophagitis: Secondary | ICD-10-CM

## 2023-06-17 ENCOUNTER — Other Ambulatory Visit: Payer: Self-pay | Admitting: Family Medicine

## 2023-06-17 DIAGNOSIS — G629 Polyneuropathy, unspecified: Secondary | ICD-10-CM

## 2023-06-20 NOTE — Telephone Encounter (Signed)
 Needs to be seen with pcp

## 2023-06-20 NOTE — Telephone Encounter (Signed)
 Appt 5-7 with Tiffany

## 2023-06-22 ENCOUNTER — Ambulatory Visit: Admitting: Family Medicine

## 2023-06-24 ENCOUNTER — Ambulatory Visit: Admitting: Family Medicine

## 2023-07-08 ENCOUNTER — Ambulatory Visit (INDEPENDENT_AMBULATORY_CARE_PROVIDER_SITE_OTHER): Admitting: Family Medicine

## 2023-07-08 VITALS — BP 133/74 | HR 80 | Temp 97.5°F | Ht 59.0 in | Wt 157.4 lb

## 2023-07-08 DIAGNOSIS — R519 Headache, unspecified: Secondary | ICD-10-CM | POA: Diagnosis not present

## 2023-07-08 DIAGNOSIS — R7303 Prediabetes: Secondary | ICD-10-CM | POA: Diagnosis not present

## 2023-07-08 DIAGNOSIS — E782 Mixed hyperlipidemia: Secondary | ICD-10-CM

## 2023-07-08 DIAGNOSIS — F331 Major depressive disorder, recurrent, moderate: Secondary | ICD-10-CM | POA: Diagnosis not present

## 2023-07-08 DIAGNOSIS — N1831 Chronic kidney disease, stage 3a: Secondary | ICD-10-CM

## 2023-07-08 DIAGNOSIS — M62838 Other muscle spasm: Secondary | ICD-10-CM

## 2023-07-08 DIAGNOSIS — F411 Generalized anxiety disorder: Secondary | ICD-10-CM | POA: Diagnosis not present

## 2023-07-08 DIAGNOSIS — J302 Other seasonal allergic rhinitis: Secondary | ICD-10-CM | POA: Diagnosis not present

## 2023-07-08 DIAGNOSIS — G629 Polyneuropathy, unspecified: Secondary | ICD-10-CM

## 2023-07-08 DIAGNOSIS — K219 Gastro-esophageal reflux disease without esophagitis: Secondary | ICD-10-CM

## 2023-07-08 DIAGNOSIS — E041 Nontoxic single thyroid nodule: Secondary | ICD-10-CM

## 2023-07-08 LAB — BAYER DCA HB A1C WAIVED: HB A1C (BAYER DCA - WAIVED): 5.6 % (ref 4.8–5.6)

## 2023-07-08 MED ORDER — GABAPENTIN 300 MG PO CAPS: 300.0000 mg | ORAL_CAPSULE | Freq: Three times a day (TID) | ORAL | 3 refills | Status: DC

## 2023-07-08 MED ORDER — CYCLOBENZAPRINE HCL 10 MG PO TABS
ORAL_TABLET | ORAL | 1 refills | Status: DC
Start: 2023-07-08 — End: 2023-11-03

## 2023-07-08 MED ORDER — PANTOPRAZOLE SODIUM 20 MG PO TBEC: 20.0000 mg | DELAYED_RELEASE_TABLET | Freq: Every day | ORAL | 3 refills | Status: DC

## 2023-07-08 MED ORDER — FLUTICASONE PROPIONATE 50 MCG/ACT NA SUSP: 2.0000 | Freq: Every day | NASAL | 1 refills | Status: AC

## 2023-07-08 NOTE — Progress Notes (Unsigned)
 Established Patient Office Visit  Subjective   [name], female    DOB: 1950-12-02  Age: 73 y.o. MRN: [id number]  Chief Complaint  Patient presents with   Medical Management of Chronic Issues    HPI  Prediabetes Last A1c was 5.7. Reports stress eating and craving chocolate lately. She has been walking daily.   2.  HLD On atorvastatin . Denies side effets.   3. Thyroid  nodule Last US  in March on 2023, recommended repeat in 1 year. She has not scheduled a repeat due to costs.   4. Asthma Compliant with Breztri  BID. Using albuterol  2x a week usually.  5. GERD Compliant with medications - Yes Current medications - protonix   Sore throat - No Voice change - No Hemoptysis - No Dysphagia or dyspepsia - No Water brash - No Red Flags (weight loss, hematochezia, melena, weight loss, early satiety, fevers, odynophagia, or persistent vomiting) - No  6. Neuropathy She has been out of gabapentin  for the last few weeks. Increased leg pain, and restless since being out of this.   7. Depression/anxiety Currently on prozac  20 and buspar  15 BID. She does feel like this is helpful, but not controlled. She did not follow through with counseling and declines a referral for now.   8. Headaches Headaches 2x a week for the last few weeks. Pain is above right eye. She lays down when headaches starts and falls asleep shortly. When she wakes up the pain is gone. Denies nausea, vomiting, changes in vision, sensitivity to noise or light. She has had water eyes, runny nose for the last few months. She has been taking xyzal  nightly and flonase  nasal spray.      07/08/2023    2:46 PM 03/03/2023   10:56 AM 11/02/2022   10:39 AM  Depression screen PHQ 2/9  Decreased Interest 0 0 0  Down, Depressed, Hopeless 0 1 0  PHQ - 2 Score 0 1 0  Altered sleeping 0 0 0  Tired, decreased energy 0 1 0  Change in appetite 0 0 0  Feeling bad or failure about yourself  0 1 0  Trouble  concentrating 0 0 0  Moving slowly or fidgety/restless 0 0 0  Suicidal thoughts 0 0 0  PHQ-9 Score 0 3 0  Difficult doing work/chores Not difficult at all Somewhat difficult Not difficult at all      07/08/2023    2:46 PM 03/03/2023   10:57 AM 10/11/2022    2:32 PM 08/30/2022    1:58 PM  GAD 7 : Generalized Anxiety Score  Nervous, Anxious, on Edge 0 0 1 2  Control/stop worrying 0 1 0 3  Worry too much - different things 0 1 1 3   Trouble relaxing 0 0 0 3  Restless 0 0 0 2  Easily annoyed or irritable 0 0 0 3  Afraid - awful might happen 0 1 1 3   Total GAD 7 Score 0 3 3 19   Anxiety Difficulty Not difficult at all Somewhat difficult Somewhat difficult Very difficult       Past Medical History:  Diagnosis Date   Allergy    Anxiety    Aortic atherosclerosis (HCC) 04/26/2021   Cataract    Coronary artery calcification seen on CAT scan 09/28/2017   Depression    Full dentures    GERD (gastroesophageal reflux disease)    Heart murmur    found a couple of years ago no problems   History of kidney  stones    History of recurrent UTIs    Hyperlipidemia    Left-sided Bell's palsy 1990s   residual left facial drooping   Mild intermittent asthma    followed by pcp   Numbness and tingling in left arm    residual from cervical neck surgery going from shoulder down to hand, not completely   OA (osteoarthritis)    hands, spine, hips, right leg   Osteopenia    Peripheral neuropathy    feet   PONV (postoperative nausea and vomiting)    Pyelonephritis 09/20/2020   Renal artery stenosis (HCC) 04/26/2021   mild stenosis of left renal artery on CT Chest   Sleep apnea    Stage 3a chronic kidney disease (HCC) 10/30/2021   Thoracic ascending aortic aneurysm (HCC)    CT 09-28-2017--- 4.3 cm. CT 04-26-2021 --- 4.1 cm.   Thyroid  nodule    ultrasound w/ bx 09/ 2019,  per pt was benign   White coat syndrome without hypertension      ROS Negative unless specially indicated above in  HPI.    Objective:     BP 133/74   Pulse 80   Temp (!) 97.5 F (36.4 C) (Temporal)   Ht 4\' 11"  (1.499 m)   Wt 157 lb 6.4 oz (71.4 kg)   SpO2 94%   BMI 31.79 kg/m  Wt Readings from Last 3 Encounters:  07/08/23 157 lb 6.4 oz (71.4 kg)  03/03/23 154 lb (69.9 kg)  11/02/22 164 lb (74.4 kg)      Physical Exam Vitals and nursing note reviewed.  Constitutional:      General: She is not in acute distress.    Appearance: She is not ill-appearing, toxic-appearing or diaphoretic.  HENT:     Nose: Nose normal.     Mouth/Throat:     Mouth: Mucous membranes are moist.     Pharynx: Oropharynx is clear.  Neck:     Thyroid : No thyroid  mass, thyromegaly or thyroid  tenderness.  Cardiovascular:     Rate and Rhythm: Normal rate and regular rhythm.     Heart sounds: Normal heart sounds. No murmur heard. Pulmonary:     Effort: Pulmonary effort is normal. No respiratory distress.     Breath sounds: Normal breath sounds. No wheezing, rhonchi or rales.  Chest:     Chest wall: No tenderness.  Abdominal:     General: Bowel sounds are normal. There is no distension.     Palpations: Abdomen is soft.     Tenderness: There is no abdominal tenderness. There is no guarding or rebound.  Musculoskeletal:     Cervical back: No rigidity.     Right lower leg: No edema.     Left lower leg: No edema.  Skin:    General: Skin is warm and dry.  Neurological:     General: No focal deficit present.     Mental Status: She is alert and oriented to person, place, and time.  Psychiatric:        Mood and Affect: Mood normal.        Behavior: Behavior normal.        Thought Content: Thought content normal.        Judgment: Judgment normal.      No results found for any visits on 07/08/23.    The 10-year ASCVD risk score (Arnett DK, et al., 2019) is: 11.7%    Assessment & Plan:   Joyce Ross was seen today for medical management of  chronic issues.  Diagnoses and all orders for this  visit:  Pre-diabetes A1c pending.  -     Bayer DCA Hb A1c Waived  Mixed hyperlipidemia On statin. Last LDL 59. -     CBC with Differential/Platelet -     CMP14+EGFR  Stage 3a chronic kidney disease (HCC) CMP pending. Avoid NSAIDs.   Class 1 obesity due to excess calories with serious comorbidity and body mass index (BMI) of 34.0 to 34.9 in adult Diet and exercise.   Major depressive disorder, recurrent, moderate (HCC) Generalized anxiety disorder Uncontrolled. Denies SI. Increase prozac  to 40 mg daily. Declines referral.  -     FLUoxetine  (PROZAC ) 40 MG capsule; Take 1 capsule (40 mg total) by mouth daily.  Thyroid  nodule Declines repeat US  for now.   No follow-ups on file. Sooner for new or worsening symptoms.   The patient indicates understanding of these issues and agrees with the plan.  Albertha Huger, FNP

## 2023-07-09 LAB — CMP14+EGFR
ALT: 18 IU/L (ref 0–32)
AST: 23 IU/L (ref 0–40)
Albumin: 4.2 g/dL (ref 3.8–4.8)
Alkaline Phosphatase: 139 IU/L — ABNORMAL HIGH (ref 44–121)
BUN/Creatinine Ratio: 27 (ref 12–28)
BUN: 31 mg/dL — ABNORMAL HIGH (ref 8–27)
Bilirubin Total: 0.3 mg/dL (ref 0.0–1.2)
CO2: 20 mmol/L (ref 20–29)
Calcium: 9.3 mg/dL (ref 8.7–10.3)
Chloride: 102 mmol/L (ref 96–106)
Creatinine, Ser: 1.14 mg/dL — ABNORMAL HIGH (ref 0.57–1.00)
Globulin, Total: 2.4 g/dL (ref 1.5–4.5)
Glucose: 80 mg/dL (ref 70–99)
Potassium: 4.5 mmol/L (ref 3.5–5.2)
Sodium: 142 mmol/L (ref 134–144)
Total Protein: 6.6 g/dL (ref 6.0–8.5)
eGFR: 51 mL/min/{1.73_m2} — ABNORMAL LOW (ref >59)

## 2023-07-09 LAB — CBC WITH DIFFERENTIAL/PLATELET
Basophils Absolute: 0.1 10*3/uL (ref 0.0–0.2)
Basos: 1 %
EOS (ABSOLUTE): 0.5 10*3/uL — ABNORMAL HIGH (ref 0.0–0.4)
Eos: 5 %
Hematocrit: 39.4 % (ref 34.0–46.6)
Hemoglobin: 13.1 g/dL (ref 11.1–15.9)
Immature Grans (Abs): 0 10*3/uL (ref 0.0–0.1)
Immature Granulocytes: 0 %
Lymphocytes Absolute: 1.5 10*3/uL (ref 0.7–3.1)
Lymphs: 17 %
MCH: 30.8 pg (ref 26.6–33.0)
MCHC: 33.2 g/dL (ref 31.5–35.7)
MCV: 93 fL (ref 79–97)
Monocytes Absolute: 0.7 10*3/uL (ref 0.1–0.9)
Monocytes: 7 %
Neutrophils Absolute: 6.4 10*3/uL (ref 1.4–7.0)
Neutrophils: 70 %
Platelets: 280 10*3/uL (ref 150–450)
RBC: 4.25 x10E6/uL (ref 3.77–5.28)
RDW: 12.8 % (ref 11.7–15.4)
WBC: 9.1 10*3/uL (ref 3.4–10.8)

## 2023-07-09 LAB — LIPID PANEL
Chol/HDL Ratio: 3.1 ratio (ref 0.0–4.4)
Cholesterol, Total: 154 mg/dL (ref 100–199)
HDL: 49 mg/dL (ref >39)
LDL Chol Calc (NIH): 81 mg/dL (ref 0–99)
Triglycerides: 140 mg/dL (ref 0–149)
VLDL Cholesterol Cal: 24 mg/dL (ref 5–40)

## 2023-07-09 LAB — VITAMIN D 25 HYDROXY (VIT D DEFICIENCY, FRACTURES): Vit D, 25-Hydroxy: 49.1 ng/mL (ref 30.0–100.0)

## 2023-07-13 ENCOUNTER — Ambulatory Visit: Payer: Self-pay | Admitting: Family Medicine

## 2023-07-13 ENCOUNTER — Encounter: Payer: Self-pay | Admitting: Family Medicine

## 2023-07-13 DIAGNOSIS — R748 Abnormal levels of other serum enzymes: Secondary | ICD-10-CM

## 2023-08-10 ENCOUNTER — Other Ambulatory Visit

## 2023-08-10 DIAGNOSIS — R748 Abnormal levels of other serum enzymes: Secondary | ICD-10-CM

## 2023-08-11 LAB — HEPATIC FUNCTION PANEL
ALT: 17 IU/L (ref 0–32)
AST: 20 IU/L (ref 0–40)
Albumin: 4 g/dL (ref 3.8–4.8)
Alkaline Phosphatase: 142 IU/L — ABNORMAL HIGH (ref 44–121)
Bilirubin Total: 0.4 mg/dL (ref 0.0–1.2)
Bilirubin, Direct: 0.14 mg/dL (ref 0.00–0.40)
Total Protein: 6.4 g/dL (ref 6.0–8.5)

## 2023-08-12 ENCOUNTER — Ambulatory Visit: Payer: Self-pay | Admitting: Family Medicine

## 2023-08-18 LAB — SPECIMEN STATUS REPORT

## 2023-08-27 ENCOUNTER — Other Ambulatory Visit: Payer: Self-pay | Admitting: Family Medicine

## 2023-08-27 DIAGNOSIS — F331 Major depressive disorder, recurrent, moderate: Secondary | ICD-10-CM

## 2023-08-27 DIAGNOSIS — F411 Generalized anxiety disorder: Secondary | ICD-10-CM

## 2023-11-03 ENCOUNTER — Ambulatory Visit (INDEPENDENT_AMBULATORY_CARE_PROVIDER_SITE_OTHER): Payer: Medicare HMO

## 2023-11-03 ENCOUNTER — Ambulatory Visit: Admitting: Family Medicine

## 2023-11-03 ENCOUNTER — Other Ambulatory Visit: Payer: Self-pay | Admitting: Family Medicine

## 2023-11-03 ENCOUNTER — Encounter: Payer: Self-pay | Admitting: Family Medicine

## 2023-11-03 VITALS — BP 133/74 | HR 80 | Ht 59.0 in | Wt 157.0 lb

## 2023-11-03 VITALS — BP 131/76 | HR 69 | Temp 97.6°F | Ht 59.0 in | Wt 161.6 lb

## 2023-11-03 DIAGNOSIS — F339 Major depressive disorder, recurrent, unspecified: Secondary | ICD-10-CM

## 2023-11-03 DIAGNOSIS — N1831 Chronic kidney disease, stage 3a: Secondary | ICD-10-CM | POA: Diagnosis not present

## 2023-11-03 DIAGNOSIS — Z23 Encounter for immunization: Secondary | ICD-10-CM | POA: Diagnosis not present

## 2023-11-03 DIAGNOSIS — R748 Abnormal levels of other serum enzymes: Secondary | ICD-10-CM | POA: Diagnosis not present

## 2023-11-03 DIAGNOSIS — M62838 Other muscle spasm: Secondary | ICD-10-CM | POA: Diagnosis not present

## 2023-11-03 DIAGNOSIS — M810 Age-related osteoporosis without current pathological fracture: Secondary | ICD-10-CM

## 2023-11-03 DIAGNOSIS — Z0000 Encounter for general adult medical examination without abnormal findings: Secondary | ICD-10-CM | POA: Diagnosis not present

## 2023-11-03 DIAGNOSIS — F411 Generalized anxiety disorder: Secondary | ICD-10-CM

## 2023-11-03 DIAGNOSIS — R350 Frequency of micturition: Secondary | ICD-10-CM | POA: Diagnosis not present

## 2023-11-03 DIAGNOSIS — Z122 Encounter for screening for malignant neoplasm of respiratory organs: Secondary | ICD-10-CM

## 2023-11-03 LAB — MICROSCOPIC EXAMINATION: WBC, UA: NONE SEEN /HPF (ref 0–5)

## 2023-11-03 LAB — URINALYSIS, ROUTINE W REFLEX MICROSCOPIC
Bilirubin, UA: NEGATIVE
Glucose, UA: NEGATIVE
Leukocytes,UA: NEGATIVE
Nitrite, UA: NEGATIVE
Specific Gravity, UA: 1.02 (ref 1.005–1.030)
Urobilinogen, Ur: 1 mg/dL (ref 0.2–1.0)
pH, UA: 5.5 (ref 5.0–7.5)

## 2023-11-03 MED ORDER — BUSPIRONE HCL 15 MG PO TABS: 15.0000 mg | ORAL_TABLET | Freq: Two times a day (BID) | ORAL | 3 refills | Status: DC

## 2023-11-03 MED ORDER — ALBUTEROL SULFATE HFA 108 (90 BASE) MCG/ACT IN AERS: 1.0000 | INHALATION_SPRAY | Freq: Four times a day (QID) | RESPIRATORY_TRACT | 0 refills | Status: AC | PRN

## 2023-11-03 MED ORDER — ALENDRONATE SODIUM 70 MG PO TABS
70.0000 mg | ORAL_TABLET | ORAL | 3 refills | Status: AC
Start: 2023-11-03 — End: ?

## 2023-11-03 MED ORDER — SULFAMETHOXAZOLE-TRIMETHOPRIM 800-160 MG PO TABS: 1.0000 | ORAL_TABLET | Freq: Two times a day (BID) | ORAL | 0 refills | Status: DC

## 2023-11-03 MED ORDER — CYCLOBENZAPRINE HCL 10 MG PO TABS: ORAL_TABLET | ORAL | 1 refills | Status: DC

## 2023-11-03 MED ORDER — SULFAMETHOXAZOLE-TRIMETHOPRIM 800-160 MG PO TABS: 1.0000 | ORAL_TABLET | Freq: Two times a day (BID) | ORAL | 0 refills | Status: AC

## 2023-11-03 NOTE — Progress Notes (Signed)
 Acute Office Visit  Subjective:     [name], female    DOB: November 30, 1950, 73 y.o.   MRN: [id number]  Chief Complaint  Patient presents with   Urinary Frequency    Urinary Frequency  Associated symptoms include frequency.    History of Present Illness   Joyce Ross is a 73 year old female who presents with urinary symptoms suggestive of a urinary tract infection.  Urinary symptoms - Significant urinary urgency, frequency, and incontinence yesterday - No dysuria - No hematuria - Urine is yellow, differing from her usual clear urine - History of similar symptoms with prior urinary tract infections         11/03/2023    1:30 PM 11/03/2023   10:20 AM 07/08/2023    2:46 PM  Depression screen PHQ 2/9  Decreased Interest 0 0 0  Down, Depressed, Hopeless 0 0 0  PHQ - 2 Score 0 0 0  Altered sleeping 0  0  Tired, decreased energy 0  0  Change in appetite 0  0  Feeling bad or failure about yourself  0  0  Trouble concentrating 0  0  Moving slowly or fidgety/restless 0  0  Suicidal thoughts 0  0  PHQ-9 Score 0  0  Difficult doing work/chores Not difficult at all  Not difficult at all      11/03/2023    1:31 PM 07/08/2023    2:46 PM 03/03/2023   10:57 AM 10/11/2022    2:32 PM  GAD 7 : Generalized Anxiety Score  Nervous, Anxious, on Edge 0 0 0 1  Control/stop worrying 0 0 1 0  Worry too much - different things 0 0 1 1  Trouble relaxing 0 0 0 0  Restless 0 0 0 0  Easily annoyed or irritable 0 0 0 0  Afraid - awful might happen 0 0 1 1  Total GAD 7 Score 0 0 3 3  Anxiety Difficulty Not difficult at all Not difficult at all Somewhat difficult Somewhat difficult     Review of Systems  Genitourinary:  Positive for frequency.   As per HPI.      Objective:    BP 131/76   Pulse 69   Temp 97.6 F (36.4 C) (Temporal)   Ht 4' 11 (1.499 m)   Wt 161 lb 9.6 oz (73.3 kg)   SpO2 96%   BMI 32.64 kg/m    Physical Exam Vitals and nursing note  reviewed.  Constitutional:      General: She is not in acute distress.    Appearance: She is not ill-appearing, toxic-appearing or diaphoretic.  Eyes:     General: No scleral icterus. Cardiovascular:     Rate and Rhythm: Normal rate and regular rhythm.     Pulses: Normal pulses.     Heart sounds: Normal heart sounds. No murmur heard. Pulmonary:     Effort: Pulmonary effort is normal. No respiratory distress.     Breath sounds: Normal breath sounds.  Abdominal:     General: Bowel sounds are normal. There is no distension.     Palpations: Abdomen is soft. There is no mass.     Tenderness: There is no abdominal tenderness. There is no guarding or rebound.  Musculoskeletal:     Cervical back: Neck supple. No tenderness.     Right lower leg: No edema.     Left lower leg: No edema.  Lymphadenopathy:     Cervical: No  cervical adenopathy.  Skin:    General: Skin is warm and dry.     Coloration: Skin is not jaundiced.  Neurological:     General: No focal deficit present.     Mental Status: She is alert and oriented to person, place, and time.  Psychiatric:        Mood and Affect: Mood normal.        Behavior: Behavior normal.        Thought Content: Thought content normal.        Judgment: Judgment normal.    Urine dipstick shows positive for RBC's, positive for protein, and positive for ketones.  Micro exam: 0 WBC's per HPF, 0-2 RBC's per HPF, and few + bacteria.     Assessment & Plan:   [name] was seen today for urinary frequency.  Diagnoses and all orders for this visit:  Urinary frequency -     Urinalysis, Routine w reflex microscopic -     Urine Culture -     Bayer DCA Hb A1c Waived; Future -     Discontinue: sulfamethoxazole -trimethoprim  (BACTRIM  DS) 800-160 MG tablet; Take 1 tablet by mouth 2 (two) times daily for 7 days. -     sulfamethoxazole -trimethoprim  (BACTRIM  DS) 800-160 MG tablet; Take 1 tablet by mouth 2 (two) times daily for 7 days.  Muscle spasm -      cyclobenzaprine  (FLEXERIL ) 10 MG tablet; TAKE ONE TABLET BY MOUTH THREE TIMES A DAY AS NEEDED FOR MUSCLE SPASMS  Depression, recurrent (HCC)  Generalized anxiety disorder -     busPIRone  (BUSPAR ) 15 MG tablet; Take 1 tablet (15 mg total) by mouth 2 (two) times daily.  Stage 3a chronic kidney disease (HCC) -     CMP14+EGFR; Future  Elevated alkaline phosphatase level -     Alkaline Phosphatase, Isoenzymes; Future -     CMP14+EGFR; Future -     Gamma GT; Future  Age-related osteoporosis without current pathological fracture -     alendronate  (FOSAMAX ) 70 MG tablet; Take 1 tablet (70 mg total) by mouth every 7 (seven) days. Take with a full glass of water on an empty stomach.  Encounter for immunization -     Flu vaccine HIGH DOSE PF(Fluzone Trivalent)  Assessment and Plan    Urinary tract infection and urinary frequency Urinary frequency and urgency without pain. Differential includes overactive bladder if culture is negative. - Prescribed Bactrim  twice a day for 7 days. - Sent urine for culture. - Consider treatment for overactive bladder if culture is negative.  Elevated alkaline phosphatase, etiology under evaluation Alkaline phosphatase slightly elevated.  - Order repeat liver function tests (LFTs), GGT, and iso enzymes - Schedule lab-only appointment for further evaluation.  Generalized anxiety disorder Anxiety and depression well-managed.   Muscle spasm Flexeril  effective for muscle spasms. - Refill Flexeril  prescription.      Keep scheduled follow up appt. Return to office for new or worsening symptoms, or if symptoms persist.   The patient indicates understanding of these issues and agrees with the plan.  Joyce CHRISTELLA Search, FNP

## 2023-11-03 NOTE — Patient Instructions (Signed)
 [name],  Thank you for taking the time for your Medicare Wellness Visit. I appreciate your continued commitment to your health goals. Please review the care plan we discussed, and feel free to reach out if I can assist you further.  Medicare recommends these wellness visits once per year to help you and your care team stay ahead of potential health issues. These visits are designed to focus on prevention, allowing your provider to concentrate on managing your acute and chronic conditions during your regular appointments.  Please note that Annual Wellness Visits do not include a physical exam. Some assessments may be limited, especially if the visit was conducted virtually. If needed, we may recommend a separate in-person follow-up with your provider.  Ongoing Care Seeing your primary care provider every 3 to 6 months helps us  monitor your health and provide consistent, personalized care.   Referrals If a referral was made during today's visit and you haven't received any updates within two weeks, please contact the referred provider directly to check on the status.  Recommended Screenings:  Health Maintenance  Topic Date Due   Screening for Lung Cancer  04/25/2022   Flu Shot  09/16/2023   COVID-19 Vaccine (4 - 2025-26 season) 10/17/2023   Medicare Annual Wellness Visit  11/02/2023   Breast Cancer Screening  07/07/2024*   Cologuard (Stool DNA test)  07/07/2024*   DEXA scan (bone density measurement)  02/27/2024   DTaP/Tdap/Td vaccine (3 - Td or Tdap) 12/30/2031   Pneumococcal Vaccine for age over 43  Completed   Hepatitis C Screening  Completed   Zoster (Shingles) Vaccine  Completed   HPV Vaccine  Aged Out   Meningitis B Vaccine  Aged Out  *Topic was postponed. The date shown is not the original due date.       11/03/2023   10:12 AM  Advanced Directives  Does Patient Have a Medical Advance Directive? No   Advance Care Planning is important because it: Ensures you receive  medical care that aligns with your values, goals, and preferences. Provides guidance to your family and loved ones, reducing the emotional burden of decision-making during critical moments.  Vision: Annual vision screenings are recommended for early detection of glaucoma, cataracts, and diabetic retinopathy. These exams can also reveal signs of chronic conditions such as diabetes and high blood pressure.  Dental: Annual dental screenings help detect early signs of oral cancer, gum disease, and other conditions linked to overall health, including heart disease and diabetes.  Please see the attached documents for additional preventive care recommendations.

## 2023-11-03 NOTE — Telephone Encounter (Signed)
 Copied from CRM [id number]. Topic: Clinical - Medication Refill >> Nov 03, 2023 11:17 AM Montie POUR wrote: Medication:  busPIRone  (BUSPAR ) 15 MG tablet  cyclobenzaprine  (FLEXERIL ) 10 MG tablet albuterol  (VENTOLIN  HFA) 108 (90 Base) MCG/ACT inhaler   Has the patient contacted their pharmacy? Yes (Agent: If no, request that the patient contact the pharmacy for the refill. If patient does not wish to contact the pharmacy document the reason why and proceed with request.) (Agent: If yes, when and what did the pharmacy advise?) Pharmacy needs order to refill  This is the patient's preferred pharmacy:  Jamila provided this information: [facility] # [phone] Fax # [phone] Is this the correct pharmacy for this prescription? Yes If no, delete pharmacy and type the correct one.   Has the prescription been filled recently? No  Is the patient out of the medication? Yes  Has the patient been seen for an appointment in the last year OR does the patient have an upcoming appointment? Yes  Can we respond through MyChart? No  Agent: Please be advised that Rx refills may take up to 3 business days. We ask that you follow-up with your pharmacy.

## 2023-11-03 NOTE — Progress Notes (Signed)
 Subjective:   Joyce Ross is a 73 y.o. who presents for a Medicare Wellness preventive visit.  As a reminder, Annual Wellness Visits don't include a physical exam, and some assessments may be limited, especially if this visit is performed virtually. We may recommend an in-person follow-up visit with your provider if needed.  Visit Complete: Virtual I connected with  Joyce Ross on 11/03/23 by a audio enabled telemedicine application and verified that I am speaking with the correct person using two identifiers.  Patient Location: Home  Provider Location: Home Office  I discussed the limitations of evaluation and management by telemedicine. The patient expressed understanding and agreed to proceed.  Vital Signs: Because this visit was a virtual/telehealth visit, some criteria may be missing or patient reported. Any vitals not documented were not able to be obtained and vitals that have been documented are patient reported.  VideoDeclined- This patient declined Librarian, academic. Therefore the visit was completed with audio only.  Persons Participating in Visit: Patient.  AWV Questionnaire: No: Patient Medicare AWV questionnaire was not completed prior to this visit.  Cardiac Risk Factors include: advanced age (>30men, >63 women);dyslipidemia;obesity (BMI >30kg/m2);smoking/ tobacco exposure     Objective:    Today's Vitals   11/03/23 1002  BP: 133/74  Pulse: 80  Weight: 157 lb (71.2 kg)  Height: 4' 11 (1.499 m)   Body mass index is 31.71 kg/m.     11/03/2023   10:12 AM 11/02/2022   10:40 AM 10/30/2021    8:48 AM 10/29/2020    8:51 AM 10/29/2019    9:14 AM 10/27/2018    8:36 AM 07/31/2018   11:22 AM  Advanced Directives  Does Patient Have a Medical Advance Directive? No No No No No No No  Would patient like information on creating a medical advance directive?  Yes (MAU/Ambulatory/Procedural Areas - Information given) Yes  (MAU/Ambulatory/Procedural Areas - Information given) No - Patient declined No - Patient declined No - Patient declined No - Patient declined      Data saved with a previous flowsheet row definition    Current Medications (verified) Outpatient Encounter Medications as of 11/03/2023  Medication Sig   albuterol  (VENTOLIN  HFA) 108 (90 Base) MCG/ACT inhaler Inhale 1-2 puffs into the lungs every 6 (six) hours as needed for wheezing or shortness of breath.   alendronate  (FOSAMAX ) 70 MG tablet Take 1 tablet (70 mg total) by mouth every 7 (seven) days. Take with a full glass of water on an empty stomach.   aspirin EC 81 MG tablet Take 81 mg by mouth daily. Swallow whole.   atorvastatin  (LIPITOR) 40 MG tablet Take 1 tablet (40 mg total) by mouth daily.   Budeson-Glycopyrrol-Formoterol  (BREZTRI  AEROSPHERE) 160-9-4.8 MCG/ACT AERO Inhale 2 puffs into the lungs 2 (two) times daily.   busPIRone  (BUSPAR ) 15 MG tablet Take 1 tablet (15 mg total) by mouth 2 (two) times daily.   Cholecalciferol (VITAMIN D -3) 125 MCG (5000 UT) TABS Take by mouth.   cyclobenzaprine  (FLEXERIL ) 10 MG tablet TAKE ONE TABLET BY MOUTH THREE TIMES A DAY AS NEEDED FOR MUSCLE SPASMS   FLUoxetine  (PROZAC ) 40 MG capsule TAKE 1 CAPSULE DAILY   fluticasone  (FLONASE ) 50 MCG/ACT nasal spray Place 2 sprays into both nostrils daily.   gabapentin  (NEURONTIN ) 300 MG capsule Take 1 capsule (300 mg total) by mouth 3 (three) times daily.   levocetirizine (XYZAL ) 5 MG tablet Take 1 tablet (5 mg total) by mouth every evening.  meclizine  (ANTIVERT ) 12.5 MG tablet Take 1 tablet (12.5 mg total) by mouth 3 (three) times daily as needed for dizziness.   Melatonin 1 MG TABS Take 1 mg by mouth at bedtime as needed (sleep).    pantoprazole  (PROTONIX ) 20 MG tablet Take 1 tablet (20 mg total) by mouth daily.   No facility-administered encounter medications on file as of 11/03/2023.    Allergies (verified) Adhesive [tape], Bee pollen, Metoprolol , Penicillins,  Radish [radish (raphanus sativus)], and Yellow jacket venom   History: Past Medical History:  Diagnosis Date   Allergy    Anxiety    Aortic atherosclerosis (HCC) 04/26/2021   Cataract    Coronary artery calcification seen on CAT scan 09/28/2017   Depression    Full dentures    GERD (gastroesophageal reflux disease)    Heart murmur    found a couple of years ago no problems   History of kidney stones    History of recurrent UTIs    Hyperlipidemia    Left-sided Bell's palsy 1990s   residual left facial drooping   Mild intermittent asthma    followed by pcp   Numbness and tingling in left arm    residual from cervical neck surgery going from shoulder down to hand, not completely   OA (osteoarthritis)    hands, spine, hips, right leg   Osteopenia    Peripheral neuropathy    feet   PONV (postoperative nausea and vomiting)    Pyelonephritis 09/20/2020   Renal artery stenosis (HCC) 04/26/2021   mild stenosis of left renal artery on CT Chest   Sleep apnea    Stage 3a chronic kidney disease (HCC) 10/30/2021   Thoracic ascending aortic aneurysm (HCC)    CT 09-28-2017--- 4.3 cm. CT 04-26-2021 --- 4.1 cm.   Thyroid  nodule    ultrasound w/ bx 09/ 2019,  per pt was benign   White coat syndrome without hypertension    Past Surgical History:  Procedure Laterality Date   ANTERIOR CERVICAL DECOMP/DISCECTOMY FUSION  1980s   CARPAL TUNNEL RELEASE Bilateral 2000   CESAREAN SECTION  x2 last one 1986   CYSTOSCOPY WITH RETROGRADE PYELOGRAM, URETEROSCOPY AND STENT PLACEMENT Left 01/11/2018   Procedure: CYSTOSCOPY, URETEROSCOPY AND STENT PLACEMENT;  Surgeon: Carolee Sherwood JONETTA DOUGLAS, MD;  Location: [facility];  Service: Urology;  Laterality: Left;   CYSTOSCOPY/URETEROSCOPY/HOLMIUM LASER/STENT PLACEMENT Left 07/31/2018   Procedure: CYSTOSCOPY/URETEROSCOPY/HOLMIUM LASER/STENT PLACEMENT;  Surgeon: Carolee Sherwood JONETTA DOUGLAS, MD;  Location: [facility];  Service: Urology;  Laterality: Left;   EXTRACORPOREAL SHOCK WAVE  LITHOTRIPSY Left 12/05/2017   Procedure: LEFT EXTRACORPOREAL SHOCK WAVE LITHOTRIPSY (ESWL);  Surgeon: Matilda Senior, MD;  Location: [facility];  Service: Urology;  Laterality: Left;   EXTRACORPOREAL SHOCK WAVE LITHOTRIPSY  1990s   FRACTURE SURGERY     HOLMIUM LASER APPLICATION Left 01/11/2018   Procedure: HOLMIUM LASER APPLICATION;  Surgeon: Carolee Sherwood JONETTA DOUGLAS, MD;  Location: [facility];  Service: Urology;  Laterality: Left;   JOINT REPLACEMENT     MOUTH SURGERY     all teeth pulled   SPINE SURGERY     TUBAL LIGATION     Family History  Problem Relation Age of Onset   Heart disease Mother    Arthritis Mother    Heart disease Father        heart attack   Heart disease Sister    Diabetes Brother    Depression Brother    Drug abuse Brother    Early death Brother  Diabetes Brother        sepsis   Heart disease Brother    Breast cancer Neg Hx    Social History   Socioeconomic History   Marital status: Widowed    Spouse name: Not on file   Number of children: 2   Years of education: 12th Grade   Highest education level: High school graduate  Occupational History   Occupation: retired  Tobacco Use   Smoking status: Former    Current packs/day: 0.00    Average packs/day: 1 pack/day for 44.0 years (44.0 ttl pk-yrs)    Types: Cigarettes    Start date: 10/09/1968    Quit date: 10/09/2012    Years since quitting: 11.0   Smokeless tobacco: Never   Tobacco comments:    since age 57  Vaping Use   Vaping status: Never Used  Substance and Sexual Activity   Alcohol use: No   Drug use: No    Comment: per pt last used pot 1970s   Sexual activity: Not Currently    Birth control/protection: Post-menopausal  Other Topics Concern   Not on file  Social History Narrative   Son lives with her   Social Drivers of Health   Financial Resource Strain: Low Risk  (11/03/2023)   Overall Financial Resource Strain (CARDIA)    Difficulty of Paying Living Expenses: Not hard at all  Food  Insecurity: No Food Insecurity (11/03/2023)   Hunger Vital Sign    Worried About Running Out of Food in the Last Year: Never true    Ran Out of Food in the Last Year: Never true  Transportation Needs: No Transportation Needs (11/03/2023)   PRAPARE - Administrator, Civil Service (Medical): No    Lack of Transportation (Non-Medical): No  Physical Activity: Insufficiently Active (11/03/2023)   Exercise Vital Sign    Days of Exercise per Week: 7 days    Minutes of Exercise per Session: 20 min  Stress: No Stress Concern Present (11/03/2023)   [employer] of Occupational Health - Occupational Stress Questionnaire    Feeling of Stress: Not at all  Social Connections: Moderately Isolated (11/03/2023)   Social Connection and Isolation Panel    Frequency of Communication with Friends and Family: More than three times a week    Frequency of Social Gatherings with Friends and Family: More than three times a week    Attends Religious Services: 1 to 4 times per year    Active Member of [employer] or Organizations: No    Attends Banker Meetings: Never    Marital Status: Widowed    Tobacco Counseling Counseling given: Yes Tobacco comments: since age 28    Clinical Intake:  Pre-visit preparation completed: Yes  Pain : No/denies pain     BMI - recorded: 31.74 Nutritional Risks: None Diabetes: No  Lab Results  Component Value Date   HGBA1C 5.6 07/08/2023   HGBA1C 5.9 (H) 08/30/2022   HGBA1C 5.7 (H) 02/26/2022     How often do you need to have someone help you when you read instructions, pamphlets, or other written materials from your doctor or pharmacy?: 1 - Never  Interpreter Needed?: No  Information entered by :: alia t/cma   Activities of Daily Living      11/03/2023   10:10 AM  In your present state of health, do you have any difficulty performing the following activities:  Hearing? 0  Vision? 0  Difficulty concentrating or making decisions? 1  Walking or climbing stairs? 0  Dressing or bathing? 0  Doing errands, shopping? 0  Preparing Food and eating ? N  Using the Toilet? N  In the past six months, have you accidently leaked urine? Y  Do you have problems with loss of bowel control? N  Managing your Medications? N  Managing your Finances? Y  Comment pt's son  Housekeeping or managing your Housekeeping? N    Patient Care Team: Joesph Annabella HERO, FNP as PCP - General (Family Medicine) Ladora Ross Lacy Phebe, MD as Referring Physician (Optometry) Carolee Sherwood JONETTA DOUGLAS, MD as Consulting Physician (Urology) Billee Mliss JONETTA, [facility] as Pharmacist (Family Medicine)  I have updated your Care Teams any recent Medical Services you may have received from other providers in the past year.     Assessment:   This is a routine wellness examination for [facility].  Hearing/Vision screen Hearing Screening - Comments:: Pt have a little bit of hearing loss Vision Screening - Comments:: Pt wear glasses/pt goes [facility] in [place]/last ov a yr ago/suggest to update vision   Goals Addressed   None    Depression Screen     11/03/2023   10:20 AM 07/08/2023    2:46 PM 03/03/2023   10:56 AM 11/02/2022   10:39 AM 10/11/2022    2:33 PM 08/30/2022    1:58 PM 02/26/2022    1:08 PM  PHQ 2/9 Scores  PHQ - 2 Score 0 0 1 0 1 3 4   PHQ- 9 Score  0 3 0 4 16 15     Fall Risk     11/03/2023   10:03 AM 07/08/2023    2:46 PM 03/03/2023   10:56 AM 11/02/2022   10:37 AM 10/11/2022    2:32 PM  Fall Risk   Falls in the past year? 1 1 1  0 1  Number falls in past yr: 1 1 1  0 0  Injury with Fall? 1 1 1  0 0  Risk for fall due to : Impaired balance/gait;Impaired mobility History of fall(s) History of fall(s) No Fall Risks History of fall(s)  Follow up Falls evaluation completed;Education provided Falls evaluation completed Falls evaluation completed Falls prevention discussed Falls evaluation completed    MEDICARE RISK AT HOME:  Medicare Risk at Home Any stairs  in or around the home?: No If so, are there any without handrails?: No Home free of loose throw rugs in walkways, pet beds, electrical cords, etc?: Yes Adequate lighting in your home to reduce risk of falls?: Yes Life alert?: No Use of a cane, walker or w/c?: No Grab bars in the bathroom?: No Shower chair or bench in shower?: Yes Elevated toilet seat or a handicapped toilet?: Yes  TIMED UP AND GO:  Was the test performed?  no  Cognitive Function: 6CIT completed    10/25/2017    8:26 AM  MMSE - Mini Mental State Exam  Orientation to time 5  Orientation to Place 5  Registration 3  Attention/ Calculation 5  Recall 3  Language- name 2 objects 2  Language- repeat 1  Language- follow 3 step command 3  Language- read & follow direction 1  Write a sentence 1  Copy design 1  Total score 30        11/03/2023   10:12 AM 11/02/2022   10:40 AM 10/30/2021    8:48 AM 10/29/2019    8:51 AM 10/27/2018    8:42 AM  6CIT Screen  What Year? 0 points 0 points 0 points  0 points 0 points  What month? 0 points 0 points 0 points 0 points 0 points  What time? 0 points 0 points 0 points 0 points 0 points  Count back from 20 0 points 0 points 0 points 0 points 0 points  Months in reverse 0 points 0 points 0 points 0 points 0 points  Repeat phrase 0 points 0 points 0 points 0 points 6 points  Total Score 0 points 0 points 0 points 0 points 6 points    Immunizations Immunization History  Administered Date(s) Administered   Fluad Quad(high Dose 65+) 03/14/2020, 11/21/2020, 10/28/2021   Fluad Trivalent(High Dose 65+) 01/03/2023   Moderna Sars-Covid-2 Vaccination 07/18/2019   PFIZER(Purple Top)SARS-COV-2 Vaccination 05/31/2019, 06/22/2019   PNEUMOCOCCAL CONJUGATE-20 11/21/2020   Pneumococcal Conjugate-13 10/25/2017   Td 12/29/2021   Tdap 02/05/2013   Zoster Recombinant(Shingrix ) 04/07/2021, 10/28/2021    Screening Tests Health Maintenance  Topic Date Due   Lung Cancer Screening  04/25/2022    Influenza Vaccine  09/16/2023   COVID-19 Vaccine (4 - 2025-26 season) 10/17/2023   Medicare Annual Wellness (AWV)  11/02/2023   Mammogram  07/07/2024 (Originally 03/23/2022)   Fecal DNA (Cologuard)  07/07/2024 (Originally 11/27/1995)   DEXA SCAN  02/27/2024   DTaP/Tdap/Td (3 - Td or Tdap) 12/30/2031   Pneumococcal Vaccine: 50+ Years  Completed   Hepatitis C Screening  Completed   Zoster Vaccines- Shingrix   Completed   HPV VACCINES  Aged Out   Meningococcal B Vaccine  Aged Out    Health Maintenance Items Addressed: See Nurse Notes at the end of this note  Additional Screening:  Vision Screening: Recommended annual ophthalmology exams for early detection of glaucoma and other disorders of the eye. Is the patient up to date with their annual eye exam?  No  Who is the provider or what is the name of the office in which the patient attends annual eye exams? [facility] in [facility]  Dental Screening: Recommended annual dental exams for proper oral hygiene  Community Resource Referral / Chronic Care Management: CRR required this visit?  No   CCM required this visit?  No   Plan:    I have personally reviewed and noted the following in the patient's chart:   Medical and social history Use of alcohol, tobacco or illicit drugs  Current medications and supplements including opioid prescriptions. Patient is not currently taking opioid prescriptions. Functional ability and status Nutritional status Physical activity Advanced directives List of other physicians Hospitalizations, surgeries, and ER visits in previous 12 months Vitals Screenings to include cognitive, depression, and falls Referrals and appointments  In addition, I have reviewed and discussed with patient certain preventive protocols, quality metrics, and best practice recommendations. A written personalized care plan for preventive services as well as general preventive health recommendations were provided to  patient.   Ozie Ned, CMA   11/03/2023   After Visit Summary: (Declined) Due to this being a telephonic visit, with patients personalized plan was offered to patient but patient Declined AVS at this time   Notes: Nothing significant to report at this time.

## 2023-11-04 ENCOUNTER — Ambulatory Visit: Payer: Self-pay | Admitting: Family Medicine

## 2023-11-04 ENCOUNTER — Other Ambulatory Visit

## 2023-11-04 DIAGNOSIS — R350 Frequency of micturition: Secondary | ICD-10-CM

## 2023-11-04 DIAGNOSIS — R748 Abnormal levels of other serum enzymes: Secondary | ICD-10-CM

## 2023-11-04 DIAGNOSIS — N1831 Chronic kidney disease, stage 3a: Secondary | ICD-10-CM

## 2023-11-04 LAB — BAYER DCA HB A1C WAIVED: HB A1C (BAYER DCA - WAIVED): 5.8 % — ABNORMAL HIGH (ref 4.8–5.6)

## 2023-11-10 LAB — CMP14+EGFR
ALT: 15 IU/L (ref 0–32)
AST: 18 IU/L (ref 0–40)
Albumin: 4 g/dL (ref 3.8–4.8)
Alkaline Phosphatase: 127 IU/L (ref 49–135)
BUN/Creatinine Ratio: 26 (ref 12–28)
BUN: 35 mg/dL — ABNORMAL HIGH (ref 8–27)
Bilirubin Total: 0.6 mg/dL (ref 0.0–1.2)
CO2: 21 mmol/L (ref 20–29)
Calcium: 9.2 mg/dL (ref 8.7–10.3)
Chloride: 101 mmol/L (ref 96–106)
Creatinine, Ser: 1.33 mg/dL — ABNORMAL HIGH (ref 0.57–1.00)
Globulin, Total: 2.6 g/dL (ref 1.5–4.5)
Glucose: 119 mg/dL — ABNORMAL HIGH (ref 70–99)
Potassium: 4 mmol/L (ref 3.5–5.2)
Sodium: 140 mmol/L (ref 134–144)
Total Protein: 6.6 g/dL (ref 6.0–8.5)
eGFR: 43 mL/min/1.73 — ABNORMAL LOW (ref >59)

## 2023-11-10 LAB — ALKALINE PHOSPHATASE, ISOENZYMES
BONE FRACTION: 22 % (ref 14–68)
INTESTINAL FRAC.: 1 % (ref 0–18)
LIVER FRACTION: 77 % (ref 18–85)

## 2023-11-10 LAB — GAMMA GT: GGT: 17 IU/L (ref 0–60)

## 2023-11-15 ENCOUNTER — Other Ambulatory Visit: Payer: Self-pay | Admitting: *Deleted

## 2023-11-15 DIAGNOSIS — F411 Generalized anxiety disorder: Secondary | ICD-10-CM

## 2023-11-15 DIAGNOSIS — E782 Mixed hyperlipidemia: Secondary | ICD-10-CM

## 2023-11-15 DIAGNOSIS — I700 Atherosclerosis of aorta: Secondary | ICD-10-CM

## 2023-11-15 DIAGNOSIS — J302 Other seasonal allergic rhinitis: Secondary | ICD-10-CM

## 2023-11-15 DIAGNOSIS — I7121 Aneurysm of the ascending aorta, without rupture: Secondary | ICD-10-CM

## 2023-11-15 DIAGNOSIS — M62838 Other muscle spasm: Secondary | ICD-10-CM

## 2023-11-15 MED ORDER — CYCLOBENZAPRINE HCL 10 MG PO TABS: ORAL_TABLET | ORAL | 1 refills | Status: DC

## 2023-11-15 MED ORDER — BUSPIRONE HCL 15 MG PO TABS: 15.0000 mg | ORAL_TABLET | Freq: Two times a day (BID) | ORAL | 3 refills | Status: DC

## 2023-11-15 MED ORDER — LEVOCETIRIZINE DIHYDROCHLORIDE 5 MG PO TABS: 5.0000 mg | ORAL_TABLET | Freq: Every evening | ORAL | 3 refills | Status: DC

## 2023-11-15 MED ORDER — ATORVASTATIN CALCIUM 40 MG PO TABS: 40.0000 mg | ORAL_TABLET | Freq: Every day | ORAL | 0 refills | Status: DC

## 2023-11-15 NOTE — Addendum Note (Signed)
 Addended by: Kaylor Maiers D on: 11/15/2023 03:46 PM   Modules accepted: Orders

## 2023-12-31 ENCOUNTER — Other Ambulatory Visit: Payer: Self-pay | Admitting: *Deleted

## 2023-12-31 DIAGNOSIS — E782 Mixed hyperlipidemia: Secondary | ICD-10-CM

## 2023-12-31 DIAGNOSIS — I700 Atherosclerosis of aorta: Secondary | ICD-10-CM

## 2023-12-31 DIAGNOSIS — I7121 Aneurysm of the ascending aorta, without rupture: Secondary | ICD-10-CM

## 2024-01-09 ENCOUNTER — Ambulatory Visit: Payer: Self-pay | Admitting: Family Medicine

## 2024-03-16 ENCOUNTER — Ambulatory Visit: Admitting: Family Medicine

## 2024-03-16 VITALS — BP 159/54 | HR 73 | Temp 98.2°F | Ht 59.0 in | Wt 168.6 lb

## 2024-03-16 DIAGNOSIS — N1831 Chronic kidney disease, stage 3a: Secondary | ICD-10-CM

## 2024-03-16 DIAGNOSIS — J4541 Moderate persistent asthma with (acute) exacerbation: Secondary | ICD-10-CM | POA: Diagnosis not present

## 2024-03-16 DIAGNOSIS — F411 Generalized anxiety disorder: Secondary | ICD-10-CM | POA: Diagnosis not present

## 2024-03-16 DIAGNOSIS — R030 Elevated blood-pressure reading, without diagnosis of hypertension: Secondary | ICD-10-CM

## 2024-03-16 DIAGNOSIS — F331 Major depressive disorder, recurrent, moderate: Secondary | ICD-10-CM

## 2024-03-16 DIAGNOSIS — N3281 Overactive bladder: Secondary | ICD-10-CM | POA: Diagnosis not present

## 2024-03-16 DIAGNOSIS — R7303 Prediabetes: Secondary | ICD-10-CM

## 2024-03-16 LAB — BAYER DCA HB A1C WAIVED: HB A1C (BAYER DCA - WAIVED): 6.1 % — ABNORMAL HIGH (ref 4.8–5.6)

## 2024-03-16 MED ORDER — PREDNISONE 20 MG PO TABS: 40.0000 mg | ORAL_TABLET | Freq: Every day | ORAL | 0 refills | Status: AC

## 2024-03-16 MED ORDER — MIRABEGRON ER 25 MG PO TB24: 25.0000 mg | ORAL_TABLET | Freq: Every day | ORAL | 2 refills | Status: DC

## 2024-03-17 LAB — CBC WITH DIFFERENTIAL/PLATELET
Basophils Absolute: 0.1 10*3/uL (ref 0.0–0.2)
Basos: 1 %
EOS (ABSOLUTE): 0.6 10*3/uL — ABNORMAL HIGH (ref 0.0–0.4)
Eos: 6 %
Hematocrit: 38.6 % (ref 34.0–46.6)
Hemoglobin: 12.7 g/dL (ref 11.1–15.9)
Immature Grans (Abs): 0 10*3/uL (ref 0.0–0.1)
Immature Granulocytes: 0 %
Lymphocytes Absolute: 1.5 10*3/uL (ref 0.7–3.1)
Lymphs: 15 %
MCH: 30.3 pg (ref 26.6–33.0)
MCHC: 32.9 g/dL (ref 31.5–35.7)
MCV: 92 fL (ref 79–97)
Monocytes Absolute: 0.6 10*3/uL (ref 0.1–0.9)
Monocytes: 7 %
Neutrophils Absolute: 6.7 10*3/uL (ref 1.4–7.0)
Neutrophils: 71 %
Platelets: 312 10*3/uL (ref 150–450)
RBC: 4.19 x10E6/uL (ref 3.77–5.28)
RDW: 12.4 % (ref 11.7–15.4)
WBC: 9.6 10*3/uL (ref 3.4–10.8)

## 2024-03-17 LAB — BMP8+EGFR
BUN/Creatinine Ratio: 26 (ref 12–28)
BUN: 24 mg/dL (ref 8–27)
CO2: 26 mmol/L (ref 20–29)
Calcium: 8.9 mg/dL (ref 8.7–10.3)
Chloride: 105 mmol/L (ref 96–106)
Creatinine, Ser: 0.94 mg/dL (ref 0.57–1.00)
Glucose: 81 mg/dL (ref 70–99)
Potassium: 4.3 mmol/L (ref 3.5–5.2)
Sodium: 146 mmol/L — ABNORMAL HIGH (ref 134–144)
eGFR: 64 mL/min/{1.73_m2}

## 2024-03-19 ENCOUNTER — Telehealth: Payer: Self-pay

## 2024-03-19 DIAGNOSIS — N3281 Overactive bladder: Secondary | ICD-10-CM

## 2024-03-21 ENCOUNTER — Ambulatory Visit: Payer: Self-pay | Admitting: Family Medicine

## 2024-03-21 DIAGNOSIS — E870 Hyperosmolality and hypernatremia: Secondary | ICD-10-CM

## 2024-03-21 MED ORDER — OXYBUTYNIN CHLORIDE ER 5 MG PO TB24: 5.0000 mg | ORAL_TABLET | Freq: Every day | ORAL | 3 refills | Status: AC

## 2024-03-21 NOTE — Telephone Encounter (Signed)
Oxybutynin sent instead

## 2024-03-21 NOTE — Telephone Encounter (Signed)
 Spoke with patient and made her aware.

## 2024-03-22 ENCOUNTER — Other Ambulatory Visit: Payer: Self-pay | Admitting: *Deleted

## 2024-03-22 ENCOUNTER — Encounter: Payer: Self-pay | Admitting: Family Medicine

## 2024-03-22 DIAGNOSIS — J302 Other seasonal allergic rhinitis: Secondary | ICD-10-CM

## 2024-03-22 DIAGNOSIS — G629 Polyneuropathy, unspecified: Secondary | ICD-10-CM

## 2024-03-22 DIAGNOSIS — K219 Gastro-esophageal reflux disease without esophagitis: Secondary | ICD-10-CM

## 2024-03-22 DIAGNOSIS — E782 Mixed hyperlipidemia: Secondary | ICD-10-CM

## 2024-03-22 DIAGNOSIS — M62838 Other muscle spasm: Secondary | ICD-10-CM

## 2024-03-22 DIAGNOSIS — I7121 Aneurysm of the ascending aorta, without rupture: Secondary | ICD-10-CM

## 2024-03-22 DIAGNOSIS — F331 Major depressive disorder, recurrent, moderate: Secondary | ICD-10-CM

## 2024-03-22 DIAGNOSIS — F411 Generalized anxiety disorder: Secondary | ICD-10-CM

## 2024-03-22 DIAGNOSIS — I700 Atherosclerosis of aorta: Secondary | ICD-10-CM

## 2024-03-22 MED ORDER — LEVOCETIRIZINE DIHYDROCHLORIDE 5 MG PO TABS: 5.0000 mg | ORAL_TABLET | Freq: Every evening | ORAL | 3 refills | Status: AC

## 2024-03-22 MED ORDER — FLUOXETINE HCL 40 MG PO CAPS: 40.0000 mg | ORAL_CAPSULE | Freq: Every day | ORAL | 0 refills | Status: AC

## 2024-03-22 MED ORDER — GABAPENTIN 300 MG PO CAPS: 300.0000 mg | ORAL_CAPSULE | Freq: Three times a day (TID) | ORAL | 0 refills | Status: AC

## 2024-03-22 MED ORDER — PANTOPRAZOLE SODIUM 20 MG PO TBEC: 20.0000 mg | DELAYED_RELEASE_TABLET | Freq: Every day | ORAL | 0 refills | Status: AC

## 2024-03-22 MED ORDER — BUSPIRONE HCL 15 MG PO TABS: 15.0000 mg | ORAL_TABLET | Freq: Two times a day (BID) | ORAL | 0 refills | Status: AC

## 2024-03-22 MED ORDER — CYCLOBENZAPRINE HCL 10 MG PO TABS: ORAL_TABLET | ORAL | 0 refills | Status: AC

## 2024-03-22 MED ORDER — ATORVASTATIN CALCIUM 40 MG PO TABS: 40.0000 mg | ORAL_TABLET | Freq: Every day | ORAL | 0 refills | Status: AC

## 2024-03-29 ENCOUNTER — Other Ambulatory Visit

## 2024-11-05 ENCOUNTER — Ambulatory Visit: Payer: Self-pay
